# Patient Record
Sex: Female | Born: 1960 | Race: White | Hispanic: No | Smoking: Never smoker
Health system: Southern US, Community
[De-identification: ages and names within clinical notes are randomized; demographics above are authoritative.]

## PROBLEM LIST (undated history)

## (undated) DIAGNOSIS — E0590 Thyrotoxicosis, unspecified without thyrotoxic crisis or storm: Secondary | ICD-10-CM

## (undated) DIAGNOSIS — M5430 Sciatica, unspecified side: Secondary | ICD-10-CM

## (undated) DIAGNOSIS — M1990 Unspecified osteoarthritis, unspecified site: Secondary | ICD-10-CM

## (undated) DIAGNOSIS — I10 Essential (primary) hypertension: Secondary | ICD-10-CM

## (undated) DIAGNOSIS — J40 Bronchitis, not specified as acute or chronic: Secondary | ICD-10-CM

## (undated) DIAGNOSIS — N390 Urinary tract infection, site not specified: Secondary | ICD-10-CM

## (undated) DIAGNOSIS — N12 Tubulo-interstitial nephritis, not specified as acute or chronic: Secondary | ICD-10-CM

## (undated) DIAGNOSIS — E119 Type 2 diabetes mellitus without complications: Secondary | ICD-10-CM

## (undated) HISTORY — PX: OTHER SURGICAL HISTORY: SHX169

## (undated) HISTORY — PX: CHOLECYSTECTOMY: SHX55

## (undated) HISTORY — PX: TUBAL LIGATION: SHX77

---

## 2005-03-08 ENCOUNTER — Emergency Department: Payer: Self-pay | Admitting: Emergency Medicine

## 2006-11-11 ENCOUNTER — Emergency Department: Payer: Self-pay | Admitting: Emergency Medicine

## 2010-06-18 ENCOUNTER — Emergency Department: Payer: Self-pay | Admitting: Emergency Medicine

## 2012-04-14 ENCOUNTER — Emergency Department: Payer: Self-pay | Admitting: Emergency Medicine

## 2012-04-14 LAB — COMPREHENSIVE METABOLIC PANEL
Alkaline Phosphatase: 61 U/L (ref 50–136)
Anion Gap: 7 (ref 7–16)
BUN: 11 mg/dL (ref 7–18)
Bilirubin,Total: 0.3 mg/dL (ref 0.2–1.0)
Calcium, Total: 8.4 mg/dL — ABNORMAL LOW (ref 8.5–10.1)
EGFR (African American): >60
Osmolality: 276 (ref 275–301)
Potassium: 3.8 mmol/L (ref 3.5–5.1)
SGOT(AST): 19 U/L (ref 15–37)
SGPT (ALT): 26 U/L

## 2012-04-14 LAB — URINALYSIS, COMPLETE
Bilirubin,UR: NEGATIVE
Blood: NEGATIVE
Ketone: NEGATIVE
Leukocyte Esterase: NEGATIVE
Nitrite: NEGATIVE
Specific Gravity: 1.023 (ref 1.003–1.030)

## 2012-04-14 LAB — CBC
HGB: 11.2 g/dL — ABNORMAL LOW (ref 12.0–16.0)
MCH: 23.6 pg — ABNORMAL LOW (ref 26.0–34.0)
RDW: 18 % — ABNORMAL HIGH (ref 11.5–14.5)

## 2012-04-14 LAB — PREGNANCY, URINE: Pregnancy Test, Urine: NEGATIVE m[IU]/mL

## 2012-10-27 ENCOUNTER — Emergency Department: Payer: Self-pay | Admitting: Unknown Physician Specialty

## 2012-10-27 LAB — COMPREHENSIVE METABOLIC PANEL
Alkaline Phosphatase: 65 U/L (ref 50–136)
Anion Gap: 5 — ABNORMAL LOW (ref 7–16)
BUN: 13 mg/dL (ref 7–18)
Bilirubin,Total: 0.5 mg/dL (ref 0.2–1.0)
Chloride: 105 mmol/L (ref 98–107)
Co2: 27 mmol/L (ref 21–32)
Creatinine: 0.37 mg/dL — ABNORMAL LOW (ref 0.60–1.30)
EGFR (African American): >60
EGFR (Non-African Amer.): >60
Glucose: 129 mg/dL — ABNORMAL HIGH (ref 65–99)
Potassium: 4.2 mmol/L (ref 3.5–5.1)
SGOT(AST): 28 U/L (ref 15–37)
Sodium: 137 mmol/L (ref 136–145)
Total Protein: 7.1 g/dL (ref 6.4–8.2)

## 2012-10-27 LAB — CBC
HCT: 36.1 % (ref 35.0–47.0)
HGB: 11.8 g/dL — ABNORMAL LOW (ref 12.0–16.0)
MCH: 24.7 pg — ABNORMAL LOW (ref 26.0–34.0)
MCHC: 32.8 g/dL (ref 32.0–36.0)
MCV: 75 fL — ABNORMAL LOW (ref 80–100)
RDW: 15.4 % — ABNORMAL HIGH (ref 11.5–14.5)

## 2012-10-27 LAB — URIC ACID: Uric Acid: 4 mg/dL (ref 2.6–6.0)

## 2014-05-11 ENCOUNTER — Emergency Department: Payer: Self-pay | Admitting: Emergency Medicine

## 2014-05-11 LAB — CBC
HCT: 38.3 % (ref 35.0–47.0)
HGB: 12.9 g/dL (ref 12.0–16.0)
MCH: 26 pg (ref 26.0–34.0)
MCHC: 33.6 g/dL (ref 32.0–36.0)
MCV: 77 fL — ABNORMAL LOW (ref 80–100)
PLATELETS: 235 10*3/uL (ref 150–440)
RBC: 4.96 10*6/uL (ref 3.80–5.20)
RDW: 14.6 % — ABNORMAL HIGH (ref 11.5–14.5)
WBC: 8.2 10*3/uL (ref 3.6–11.0)

## 2014-05-11 LAB — BASIC METABOLIC PANEL
Anion Gap: 5 — ABNORMAL LOW (ref 7–16)
BUN: 14 mg/dL (ref 7–18)
CALCIUM: 8.6 mg/dL (ref 8.5–10.1)
CO2: 27 mmol/L (ref 21–32)
Chloride: 106 mmol/L (ref 98–107)
Creatinine: 0.45 mg/dL — ABNORMAL LOW (ref 0.60–1.30)
EGFR (African American): >60
Glucose: 108 mg/dL — ABNORMAL HIGH (ref 65–99)
Osmolality: 277 (ref 275–301)
POTASSIUM: 3.6 mmol/L (ref 3.5–5.1)
Sodium: 138 mmol/L (ref 136–145)

## 2014-05-11 LAB — TROPONIN I: Troponin-I: <0.02 ng/mL

## 2014-08-25 ENCOUNTER — Emergency Department: Payer: Self-pay | Admitting: Emergency Medicine

## 2014-08-25 LAB — URINALYSIS, COMPLETE
Bilirubin,UR: NEGATIVE
Glucose,UR: 50 mg/dL (ref 0–75)
Ketone: NEGATIVE
NITRITE: NEGATIVE
Ph: 6 (ref 4.5–8.0)
Protein: NEGATIVE
SPECIFIC GRAVITY: 1.014 (ref 1.003–1.030)

## 2014-08-25 LAB — CBC WITH DIFFERENTIAL/PLATELET
Basophil #: 0 10*3/uL (ref 0.0–0.1)
Basophil %: 0.4 %
EOS PCT: 1.9 %
Eosinophil #: 0.1 10*3/uL (ref 0.0–0.7)
HCT: 37.2 % (ref 35.0–47.0)
HGB: 12.4 g/dL (ref 12.0–16.0)
LYMPHS PCT: 36 %
Lymphocyte #: 2.7 10*3/uL (ref 1.0–3.6)
MCH: 25.7 pg — AB (ref 26.0–34.0)
MCHC: 33.3 g/dL (ref 32.0–36.0)
MCV: 77 fL — ABNORMAL LOW (ref 80–100)
Monocyte #: 0.7 x10 3/mm (ref 0.2–0.9)
Monocyte %: 9.9 %
Neutrophil #: 3.9 10*3/uL (ref 1.4–6.5)
Neutrophil %: 51.8 %
PLATELETS: 218 10*3/uL (ref 150–440)
RBC: 4.82 10*6/uL (ref 3.80–5.20)
RDW: 13.8 % (ref 11.5–14.5)
WBC: 7.4 10*3/uL (ref 3.6–11.0)

## 2014-08-25 LAB — COMPREHENSIVE METABOLIC PANEL
ALBUMIN: 3 g/dL — AB (ref 3.4–5.0)
ANION GAP: 7 (ref 7–16)
AST: 30 U/L (ref 15–37)
Alkaline Phosphatase: 121 U/L — ABNORMAL HIGH
BILIRUBIN TOTAL: 0.5 mg/dL (ref 0.2–1.0)
BUN: 9 mg/dL (ref 7–18)
Calcium, Total: 8.3 mg/dL — ABNORMAL LOW (ref 8.5–10.1)
Chloride: 106 mmol/L (ref 98–107)
Co2: 28 mmol/L (ref 21–32)
Creatinine: 0.44 mg/dL — ABNORMAL LOW (ref 0.60–1.30)
GLUCOSE: 236 mg/dL — AB (ref 65–99)
Osmolality: 288 (ref 275–301)
Potassium: 3.5 mmol/L (ref 3.5–5.1)
SGPT (ALT): 31 U/L
Sodium: 141 mmol/L (ref 136–145)
Total Protein: 6.5 g/dL (ref 6.4–8.2)

## 2014-08-25 LAB — DRUG SCREEN, URINE

## 2014-08-26 LAB — TSH: Thyroid Stimulating Horm: <0.01 u[IU]/mL — ABNORMAL LOW

## 2014-08-26 LAB — TROPONIN I: Troponin-I: <0.02 ng/mL

## 2014-08-26 LAB — D-DIMER(ARMC): D-Dimer: 249 ng/ml

## 2014-10-11 ENCOUNTER — Emergency Department: Payer: Self-pay | Admitting: Emergency Medicine

## 2014-10-11 LAB — BASIC METABOLIC PANEL
Anion Gap: 6 — ABNORMAL LOW (ref 7–16)
BUN: 13 mg/dL (ref 7–18)
CALCIUM: 8.4 mg/dL — AB (ref 8.5–10.1)
CHLORIDE: 104 mmol/L (ref 98–107)
Co2: 29 mmol/L (ref 21–32)
Creatinine: 0.55 mg/dL — ABNORMAL LOW (ref 0.60–1.30)
EGFR (Non-African Amer.): >60
GLUCOSE: 147 mg/dL — AB (ref 65–99)
Osmolality: 280 (ref 275–301)
POTASSIUM: 4 mmol/L (ref 3.5–5.1)
Sodium: 139 mmol/L (ref 136–145)

## 2014-10-11 LAB — TSH

## 2014-10-11 LAB — CBC
HCT: 42.4 % (ref 35.0–47.0)
HGB: 14.4 g/dL (ref 12.0–16.0)
MCH: 26.2 pg (ref 26.0–34.0)
MCHC: 34 g/dL (ref 32.0–36.0)
MCV: 77 fL — ABNORMAL LOW (ref 80–100)
PLATELETS: 230 10*3/uL (ref 150–440)
RBC: 5.49 10*6/uL — ABNORMAL HIGH (ref 3.80–5.20)
RDW: 13.8 % (ref 11.5–14.5)
WBC: 6.8 10*3/uL (ref 3.6–11.0)

## 2014-10-11 LAB — PROTIME-INR
INR: 1.1
PROTHROMBIN TIME: 13.7 s (ref 11.5–14.7)

## 2014-10-11 LAB — D-DIMER(ARMC): D-DIMER: 247 ng/mL

## 2014-10-11 LAB — PRO B NATRIURETIC PEPTIDE: B-Type Natriuretic Peptide: 32 pg/mL (ref 0–125)

## 2014-10-11 LAB — TROPONIN I

## 2014-11-10 ENCOUNTER — Emergency Department: Payer: Self-pay | Admitting: Emergency Medicine

## 2015-03-02 ENCOUNTER — Emergency Department: Admit: 2015-03-02 | Disposition: A | Discharge status: Home / Self Care | Payer: Self-pay | Admitting: Internal Medicine

## 2015-05-04 DIAGNOSIS — R000 Tachycardia, unspecified: Secondary | ICD-10-CM | POA: Insufficient documentation

## 2015-05-04 DIAGNOSIS — I10 Essential (primary) hypertension: Secondary | ICD-10-CM | POA: Insufficient documentation

## 2015-05-04 DIAGNOSIS — M545 Low back pain: Secondary | ICD-10-CM | POA: Diagnosis present

## 2015-05-04 DIAGNOSIS — M5442 Lumbago with sciatica, left side: Secondary | ICD-10-CM | POA: Insufficient documentation

## 2015-05-04 NOTE — ED Notes (Signed)
Patient ambulatory to triage with steady gait, without difficulty or distress noted; pt reports lower back pain for few months with no known injury; st pain radiates down into left leg & foot; denies hx of same

## 2015-05-05 ENCOUNTER — Emergency Department
Admission: EM | Admit: 2015-05-05 | Discharge: 2015-05-05 | Disposition: A | Discharge status: Home / Self Care | Payer: 59 | Attending: Emergency Medicine | Admitting: Emergency Medicine

## 2015-05-05 ENCOUNTER — Encounter: Payer: Self-pay | Admitting: Emergency Medicine

## 2015-05-05 DIAGNOSIS — M5442 Lumbago with sciatica, left side: Secondary | ICD-10-CM

## 2015-05-05 HISTORY — DX: Thyrotoxicosis, unspecified without thyrotoxic crisis or storm: E05.90

## 2015-05-05 HISTORY — DX: Essential (primary) hypertension: I10

## 2015-05-05 LAB — URINALYSIS COMPLETE WITH MICROSCOPIC (ARMC ONLY)
BILIRUBIN URINE: NEGATIVE
Bacteria, UA: NONE SEEN
GLUCOSE, UA: 50 mg/dL — AB
Hgb urine dipstick: NEGATIVE
KETONES UR: NEGATIVE mg/dL
Leukocytes, UA: NEGATIVE
Nitrite: NEGATIVE
PROTEIN: NEGATIVE mg/dL
SPECIFIC GRAVITY, URINE: 1.01 (ref 1.005–1.030)
Squamous Epithelial / LPF: NONE SEEN
pH: 5 (ref 5.0–8.0)

## 2015-05-05 MED ORDER — TRAMADOL HCL 50 MG PO TABS
50.0000 mg | ORAL_TABLET | Freq: Once | ORAL | Status: AC
Start: 2015-05-05 — End: 2015-05-05
  Administered 2015-05-05: 50 mg via ORAL

## 2015-05-05 MED ORDER — TRAMADOL HCL 50 MG PO TABS: 50.0000 mg | ORAL_TABLET | Freq: Four times a day (QID) | ORAL | Status: DC | PRN

## 2015-05-05 MED ORDER — TRAMADOL HCL 50 MG PO TABS
ORAL_TABLET | ORAL | Status: AC
  Filled 2015-05-05: qty 1

## 2015-05-05 NOTE — ED Provider Notes (Signed)
[facility] Emergency Department Provider Note  ____________________________________________  Time seen: Approximately 234 AM  I have reviewed the triage vital signs and the nursing notes.   HISTORY  Chief Complaint Back Pain    HPI Kristin Wilson is a 54 y.o. female who comes in today with back pain. The patient reports that she's had back pain on and off for a while meaning a few months but reports it is worse today. She reports that the pain goes down into her left leg. The patient reports that she's been taking ibuprofen for pain but that the pain has not been getting better. She reports that the pain initially was an 8 out of 10 in intensity and is now a 5 out of 10 in intensity. The patient has not seen her doctor for this pain and she just received insurance. The patient reports that the sharp pain that it just hurts. Any injury to her back but again this is been going on for some months. A shunt has no difficulty with urination no incontinence or urinary retention. She is here for evaluation   Past Medical History  Diagnosis Date  . Hypertension   . Hyperthyroidism     There are no active problems to display for this patient.   Past Surgical History  Procedure Laterality Date  . Cholecystecomy    . Tubal ligation      Current Outpatient Rx  Name  Route  Sig  Dispense  Refill  . traMADol (ULTRAM) 50 MG tablet   Oral   Take 1 tablet (50 mg total) by mouth every 6 (six) hours as needed.   12 tablet   0     Allergies Review of patient's allergies indicates no known allergies.  No family history on file.  Social History History  Substance Use Topics  . Smoking status: Never Smoker   . Smokeless tobacco: Not on file  . Alcohol Use: No    Review of Systems Constitutional: No fever/chills Eyes: No visual changes. ENT: No sore throat. Cardiovascular: Denies chest pain. Respiratory: Denies shortness of breath. Gastrointestinal:  No abdominal pain.  No nausea, no vomiting.   Genitourinary: Negative for dysuria. Musculoskeletal: back pain. Skin: Negative for rash. Neurological: Negative for headaches,   10-point ROS otherwise negative.  ____________________________________________   PHYSICAL EXAM:  VITAL SIGNS: ED Triage Vitals  Enc Vitals Group     BP 05/04/15 2359 172/79 mmHg     Pulse Rate 05/04/15 2359 100     Resp 05/04/15 2359 20     Temp 05/04/15 2359 98 F (36.7 C)     Temp Source 05/04/15 2359 Oral     SpO2 05/04/15 2359 97 %     Weight 05/04/15 2359 170 lb (77.111 kg)     Height 05/04/15 2359 5\' 2"  (1.575 m)     Head Cir --      Peak Flow --      Pain Score 05/04/15 2359 10     Pain Loc --      Pain Edu? --      Excl. in [place]? --     Constitutional: Alert and oriented. Well appearing and in mild distress. Eyes: Conjunctivae are normal. PERRL. EOMI. Head: Atraumatic. Nose: No congestion/rhinnorhea. Mouth/Throat: Mucous membranes are moist.  Oropharynx non-erythematous. Cardiovascular: Tachycardia, regular rhythm. Grossly normal heart sounds.  Good peripheral circulation. Respiratory: Normal respiratory effort.  No retractions. Lungs CTAB. Gastrointestinal: Soft and nontender. No distention. Positive bowel sounds Genitourinary:  Deferred Musculoskeletal: No pain with active range of motion, negative straight leg raise, no significant tenderness to palpation, no midline tenderness to palpation. Neurologic:  Normal speech and language. No gross focal neurologic deficits are appreciated.  Skin:  Skin is warm, dry and intact. No rash noted. Psychiatric: Mood and affect are normal.   ____________________________________________   LABS (all labs ordered are listed, but only abnormal results are displayed)  Labs Reviewed  URINALYSIS COMPLETEWITH MICROSCOPIC ([place] ONLY) - Abnormal; Notable for the following:    Color, Urine YELLOW (*)    APPearance CLEAR (*)    Glucose, UA 50 (*)    All  other components within normal limits   ____________________________________________  EKG  None ____________________________________________  RADIOLOGY  None ____________________________________________   PROCEDURES  Procedure(s) performed: None  Critical Care performed: No  ____________________________________________   INITIAL IMPRESSION / ASSESSMENT AND PLAN / ED COURSE  Pertinent labs & imaging results that were available during my care of the patient were reviewed by me and considered in my medical decision making (see chart for details).  The patient is a 54 year old female who comes in with months of intermittent back pain with pain down the left side of her leg that is worse today. On initial evaluation the patient was sleeping and appeared comfortable. She also is able to move without significant pain and discomfort. I did give the patient a dose of tramadol and took the patient urine which was unremarkable. The patient's pain was significantly improved after the dose of tramadol and she was sleeping comfortably. I will discharge the patient home to follow-up with orthopedics for further evaluation of her intermittent chronic back pain. ____________________________________________   FINAL CLINICAL IMPRESSION(S) / ED DIAGNOSES  Final diagnoses:  Bilateral low back pain with left-sided sciatica      Loney Hering, MD 05/05/15 [phone]

## 2015-05-05 NOTE — ED Notes (Signed)
Patient discharged to home per MD order. Patient in stable condition, and deemed medically cleared by ED provider for discharge. Discharge instructions reviewed with patient/family using "Teach Back"; verbalized understanding of medication education and administration, and information about follow-up care. Denies further concerns. ° °

## 2015-05-05 NOTE — ED Notes (Signed)
Pt resting with eyes closed no signs of pain or distress noted.

## 2015-05-05 NOTE — ED Notes (Signed)
Report received from [facility]. Patient care assumed. Patient/RN introduction complete. Will continue to monitor.

## 2015-05-05 NOTE — Discharge Instructions (Signed)
Back Pain, Adult °Back pain is very common. The pain often gets better over time. The cause of back pain is usually not dangerous. Most people can learn to manage their back pain on their own.  °HOME CARE  °· Stay active. Start with short walks on flat ground if you can. Try to walk farther each day. °· Do not sit, drive, or stand in one place for more than 30 minutes. Do not stay in bed. °· Do not avoid exercise or work. Activity can help your back heal faster. °· Be careful when you bend or lift an object. Bend at your knees, keep the object close to you, and do not twist. °· Sleep on a firm mattress. Lie on your side, and bend your knees. If you lie on your back, put a pillow under your knees. °· Only take medicines as told by your doctor. °· Put ice on the injured area. °¨ Put ice in a plastic bag. °¨ Place a towel between your skin and the bag. °¨ Leave the ice on for 15-20 minutes, 03-04 times a day for the first 2 to 3 days. After that, you can switch between ice and heat packs. °· Ask your doctor about back exercises or massage. °· Avoid feeling anxious or stressed. Find good ways to deal with stress, such as exercise. °GET HELP RIGHT AWAY IF:  °· Your pain does not go away with rest or medicine. °· Your pain does not go away in 1 week. °· You have new problems. °· You do not feel well. °· The pain spreads into your legs. °· You cannot control when you poop (bowel movement) or pee (urinate). °· Your arms or legs feel weak or lose feeling (numbness). °· You feel sick to your stomach (nauseous) or throw up (vomit). °· You have belly (abdominal) pain. °· You feel like you may pass out (faint). °MAKE SURE YOU:  °· Understand these instructions. °· Will watch your condition. °· Will get help right away if you are not doing well or get worse. °Document Released: 05/01/2008 Document Revised: 02/05/2012 Document Reviewed: 03/17/2014 °[employer]® Patient Information ©2015 [employer]. This information is not intended  to replace advice given to you by your health care provider. Make sure you discuss any questions you have with your health care provider. ° °Back Exercises °Back exercises help treat and prevent back injuries. The goal is to increase your strength in your belly (abdominal) and back muscles. These exercises can also help with flexibility. Start these exercises when told by your doctor. °HOME CARE °Back exercises include: °Pelvic Tilt. °· Lie on your back with your knees bent. Tilt your pelvis until the lower part of your back is against the floor. Hold this position 5 to 10 sec. Repeat this exercise 5 to 10 times. °Knee to Chest. °· Pull 1 knee up against your chest and hold for 20 to 30 seconds. Repeat this with the other knee. This may be done with the other leg straight or bent, whichever feels better. Then, pull both knees up against your chest. °Sit-Ups or Curl-Ups. °· Bend your knees 90 degrees. Start with tilting your pelvis, and do a partial, slow sit-up. Only lift your upper half 30 to 45 degrees off the floor. Take at least 2 to 3 seonds for each sit-up. Do not do sit-ups with your knees out straight. If partial sit-ups are difficult, simply do the above but with only tightening your belly (abdominal) muscles and holding it as   told. Hip-Lift.  Lie on your back with your knees flexed 90 degrees. Push down with your feet and shoulders as you raise your hips 2 inches off the floor. Hold for 10 seconds, repeat 5 to 10 times. Back Arches.  Lie on your stomach. Prop yourself up on bent elbows. Slowly press on your hands, causing an arch in your low back. Repeat 3 to 5 times. Shoulder-Lifts.  Lie face down with arms beside your body. Keep hips and belly pressed to floor as you slowly lift your head and shoulders off the floor. Do not overdo your exercises. Be careful in the beginning. Exercises may cause you some mild back discomfort. If the pain lasts for more than 15 minutes, stop the exercises until  you see your doctor. Improvement with exercise for back problems is slow.  Document Released: 12/16/2010 Document Revised: 02/05/2012 Document Reviewed: 09/14/2011 [facility] Patient Information 2015 [place], [place]. This information is not intended to replace advice given to you by your health care provider. Make sure you discuss any questions you have with your health care provider.

## 2015-05-12 ENCOUNTER — Encounter: Payer: Self-pay | Admitting: Emergency Medicine

## 2015-05-12 ENCOUNTER — Emergency Department
Admission: EM | Admit: 2015-05-12 | Discharge: 2015-05-13 | Disposition: A | Discharge status: Home / Self Care | Payer: 59 | Attending: Emergency Medicine | Admitting: Emergency Medicine

## 2015-05-12 DIAGNOSIS — R079 Chest pain, unspecified: Secondary | ICD-10-CM | POA: Insufficient documentation

## 2015-05-12 DIAGNOSIS — M79672 Pain in left foot: Secondary | ICD-10-CM | POA: Diagnosis present

## 2015-05-12 DIAGNOSIS — L03116 Cellulitis of left lower limb: Secondary | ICD-10-CM | POA: Diagnosis not present

## 2015-05-12 DIAGNOSIS — I10 Essential (primary) hypertension: Secondary | ICD-10-CM | POA: Insufficient documentation

## 2015-05-12 DIAGNOSIS — R609 Edema, unspecified: Secondary | ICD-10-CM

## 2015-05-12 DIAGNOSIS — M79605 Pain in left leg: Secondary | ICD-10-CM

## 2015-05-12 LAB — CBC
HEMATOCRIT: 38.6 % (ref 35.0–47.0)
HEMOGLOBIN: 12.7 g/dL (ref 12.0–16.0)
MCH: 24.8 pg — ABNORMAL LOW (ref 26.0–34.0)
MCHC: 32.9 g/dL (ref 32.0–36.0)
MCV: 75.5 fL — AB (ref 80.0–100.0)
PLATELETS: 235 10*3/uL (ref 150–440)
RBC: 5.12 MIL/uL (ref 3.80–5.20)
RDW: 15.3 % — ABNORMAL HIGH (ref 11.5–14.5)
WBC: 7.6 10*3/uL (ref 3.6–11.0)

## 2015-05-12 LAB — BASIC METABOLIC PANEL
Anion gap: 5 (ref 5–15)
BUN: 16 mg/dL (ref 6–20)
CO2: 27 mmol/L (ref 22–32)
CREATININE: 0.39 mg/dL — AB (ref 0.44–1.00)
Calcium: 9.3 mg/dL (ref 8.9–10.3)
Chloride: 106 mmol/L (ref 101–111)
GFR calc non Af Amer: >60 mL/min (ref >60)
GLUCOSE: 192 mg/dL — AB (ref 65–99)
POTASSIUM: 3.7 mmol/L (ref 3.5–5.1)
Sodium: 138 mmol/L (ref 135–145)

## 2015-05-12 LAB — TROPONIN I: Troponin I: <0.03 ng/mL (ref <0.031)

## 2015-05-12 NOTE — ED Notes (Addendum)
Pt reports painful left foot up to knee, denies injury; pt reports pain x3 days, pt ambulatory to triage. Pt also reports chest pain that has been intermittent x4-5 days; reports started after arriving to hospital. Pt reports radiation to back, nausea; denies shortness of breath, vomiting.

## 2015-05-12 NOTE — ED Notes (Signed)
Pt presents ambulatory to ED with c/o ankle/foot pain with no known injury.

## 2015-05-13 ENCOUNTER — Emergency Department: Payer: 59

## 2015-05-13 MED ORDER — CEPHALEXIN 500 MG PO CAPS
500.0000 mg | ORAL_CAPSULE | Freq: Once | ORAL | Status: AC
  Administered 2015-05-13: 500 mg via ORAL

## 2015-05-13 MED ORDER — CEPHALEXIN 500 MG PO CAPS
ORAL_CAPSULE | ORAL | Status: AC
  Administered 2015-05-13: 500 mg via ORAL
  Filled 2015-05-13: qty 1

## 2015-05-13 MED ORDER — CEPHALEXIN 500 MG PO CAPS: 500.0000 mg | ORAL_CAPSULE | Freq: Two times a day (BID) | ORAL | Status: DC

## 2015-05-13 NOTE — ED Provider Notes (Signed)
[facility] Emergency Department Provider Note  ____________________________________________  Time seen: 1:15 AM  I have reviewed the triage vital signs and the nursing notes.   HISTORY  Chief Complaint Foot Pain and Chest Pain      HPI Kristin Wilson is a 54 y.o. female presents with nontraumatic pain to leftfoot pain and swelling 3 days. Patient denies any chest pain denies shortness of breath no history of DVTs or PE.     Past Medical History  Diagnosis Date  . Hypertension   . Hyperthyroidism     There are no active problems to display for this patient.   Past Surgical History  Procedure Laterality Date  . Cholecystecomy    . Tubal ligation    . Cholecystectomy      Current Outpatient Rx  Name  Route  Sig  Dispense  Refill  . traMADol (ULTRAM) 50 MG tablet   Oral   Take 1 tablet (50 mg total) by mouth every 6 (six) hours as needed.   12 tablet   0     Allergies Review of patient's allergies indicates no known allergies.  No family history on file.  Social History History  Substance Use Topics  . Smoking status: Never Smoker   . Smokeless tobacco: Not on file  . Alcohol Use: No    Review of Systems  Constitutional: Negative for fever. Eyes: Negative for visual changes. ENT: Negative for sore throat. Cardiovascular: Negative for chest pain. Respiratory: Negative for shortness of breath. Gastrointestinal: Negative for abdominal pain, vomiting and diarrhea. Genitourinary: Negative for dysuria. Musculoskeletal: Negative for back pain. Positive left foot pain and swelling Skin: Negative for rash. Neurological: Negative for headaches, focal weakness or numbness.   10-point ROS otherwise negative.  ____________________________________________   PHYSICAL EXAM:  VITAL SIGNS: ED Triage Vitals  Enc Vitals Group     BP 05/12/15 2222 190/96 mmHg     Pulse Rate 05/12/15 2220 123     Resp 05/12/15 2220 20      Temp 05/12/15 2220 98.9 F (37.2 C)     Temp Source 05/12/15 2220 Oral     SpO2 05/12/15 2222 97 %     Weight 05/12/15 2220 175 lb (79.379 kg)     Height 05/12/15 2220 5\' 2"  (1.575 m)     Head Cir --      Peak Flow --      Pain Score 05/12/15 2222 6     Pain Loc --      Pain Edu? --      Excl. in [place]? --      Constitutional: Alert and oriented. Well appearing and in no distress. Eyes: Conjunctivae are normal. PERRL. Normal extraocular movements. ENT   Head: Normocephalic and atraumatic.   Nose: No congestion/rhinnorhea.   Mouth/Throat: Mucous membranes are moist.   Neck: No stridor. Hematological/Lymphatic/Immunilogical: No cervical lymphadenopathy. Cardiovascular: Normal rate, regular rhythm. Normal and symmetric distal pulses are present in all extremities. No murmurs, rubs, or gallops. Respiratory: Normal respiratory effort without tachypnea nor retractions. Breath sounds are clear and equal bilaterally. No wheezes/rales/rhonchi. Gastrointestinal: Soft and nontender. No distention. There is no CVA tenderness. Genitourinary: deferred Musculoskeletal: Nontender with normal range of motion in all extremities. No joint effusions. Positive left foot pain and swelling  Neurologic:  Normal speech and language. No gross focal neurologic deficits are appreciated. Speech is normal.  Skin:  Skin is warm, dry and intact. No rash noted. Erythema to the dorsal aspect of  the left foot and medial malleoli Psychiatric: Mood and affect are normal. Speech and behavior are normal. Patient exhibits appropriate insight and judgment.  ____________________________________________    LABS (pertinent positives/negatives)  Labs Reviewed  CBC - Abnormal; Notable for the following:    MCV 75.5 (*)    MCH 24.8 (*)    RDW 15.3 (*)    All other components within normal limits  BASIC METABOLIC PANEL - Abnormal; Notable for the following:    Glucose, Bld 192 (*)    Creatinine, Ser 0.39 (*)     All other components within normal limits  TROPONIN I       RADIOLOGY  Left lower extremity ultrasound revealed no evidence of a DVT per radiologist     INITIAL IMPRESSION / [place] / ED COURSE  Pertinent labs & imaging results that were available during my care of the patient were reviewed by me and considered in my medical decision making (see chart for details).  History of physical exam concerning for possible cellulitis of the left foot as such patient given Keflex and will be prescribed same at home. Ultrasound revealed no evidence of a DVT however patient is advised to return to the emergency department immediately if pain or swelling worsens.  ____________________________________________   FINAL CLINICAL IMPRESSION(S) / ED DIAGNOSES  Final diagnoses:  Swelling  Left leg pain  Cellulitis of left foot      Gregor Hams, MD 05/13/15 [phone]

## 2015-05-13 NOTE — Discharge Instructions (Signed)

## 2015-06-18 ENCOUNTER — Emergency Department
Admission: EM | Admit: 2015-06-18 | Discharge: 2015-06-18 | Disposition: A | Discharge status: Home / Self Care | Payer: Commercial Managed Care - HMO | Attending: Emergency Medicine | Admitting: Emergency Medicine

## 2015-06-18 ENCOUNTER — Encounter: Payer: Self-pay | Admitting: *Deleted

## 2015-06-18 DIAGNOSIS — M5432 Sciatica, left side: Secondary | ICD-10-CM | POA: Diagnosis not present

## 2015-06-18 DIAGNOSIS — I10 Essential (primary) hypertension: Secondary | ICD-10-CM | POA: Diagnosis not present

## 2015-06-18 DIAGNOSIS — M545 Low back pain: Secondary | ICD-10-CM | POA: Diagnosis present

## 2015-06-18 DIAGNOSIS — Z792 Long term (current) use of antibiotics: Secondary | ICD-10-CM | POA: Diagnosis not present

## 2015-06-18 MED ORDER — IBUPROFEN 800 MG PO TABS: 800.0000 mg | ORAL_TABLET | Freq: Three times a day (TID) | ORAL | Status: DC | PRN

## 2015-06-18 MED ORDER — DIAZEPAM 2 MG PO TABS: 2.0000 mg | ORAL_TABLET | Freq: Three times a day (TID) | ORAL | Status: DC | PRN

## 2015-06-18 MED ORDER — KETOROLAC TROMETHAMINE 30 MG/ML IJ SOLN
60.0000 mg | Freq: Once | INTRAMUSCULAR | Status: AC
  Administered 2015-06-18: 60 mg via INTRAMUSCULAR
  Filled 2015-06-18: qty 2

## 2015-06-18 MED ORDER — DIAZEPAM 2 MG PO TABS
2.0000 mg | ORAL_TABLET | Freq: Once | ORAL | Status: AC
  Administered 2015-06-18: 2 mg via ORAL
  Filled 2015-06-18: qty 1

## 2015-06-18 MED ORDER — HYDROCODONE-ACETAMINOPHEN 5-325 MG PO TABS: 1.0000 | ORAL_TABLET | Freq: Four times a day (QID) | ORAL | Status: DC | PRN

## 2015-06-18 MED ORDER — HYDROCODONE-ACETAMINOPHEN 5-325 MG PO TABS
1.0000 | ORAL_TABLET | Freq: Once | ORAL | Status: AC
  Administered 2015-06-18: 1 via ORAL
  Filled 2015-06-18: qty 1

## 2015-06-18 NOTE — ED Provider Notes (Signed)
[facility] Emergency Department Provider Note  ____________________________________________  Time seen: Approximately 12:54 AM  I have reviewed the triage vital signs and the nursing notes.   HISTORY  Chief Complaint Back Pain    HPI Kristin Wilson is a 54 y.o. female who presents to the ED from home with a chief complaint of back pain. Patient states her back has been hurting for "a while"; specifically approximately 1 month, but has worsened over the past week. 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 Describes sharp left lower back pain shooting into the left leg. Denies trauma/fall/injury. Denies numbness/tingling/weakness. Denies urinary or bowel incontinence. Denies dysuria. Of note, patient treated approximately 1 month ago for left foot cellulitis.States foot has remained swollen, although generally the swelling has improved.   Past Medical History  Diagnosis Date  . Hypertension   . Hyperthyroidism     There are no active problems to display for this patient.   Past Surgical History  Procedure Laterality Date  . Cholecystecomy    . Tubal ligation    . Cholecystectomy      Current Outpatient Rx  Name  Route  Sig  Dispense  Refill  . cephALEXin (KEFLEX) 500 MG capsule   Oral   Take 1 capsule (500 mg total) by mouth 2 (two) times daily.   20 capsule   0   . traMADol (ULTRAM) 50 MG tablet   Oral   Take 1 tablet (50 mg total) by mouth every 6 (six) hours as needed.   12 tablet   0     Allergies Review of patient's allergies indicates no known allergies.  No family history on file.  Social History History  Substance Use Topics  . Smoking status: Never Smoker   . Smokeless tobacco: Not on file  . Alcohol Use: No    Review of Systems Constitutional: No fever/chills Eyes: No visual changes. ENT: No sore throat. Cardiovascular: Denies chest pain. Respiratory: Denies shortness of  breath. Gastrointestinal: No abdominal pain.  No nausea, no vomiting.  No diarrhea.  No constipation. Genitourinary: Negative for dysuria. Musculoskeletal: Positive for back pain. Skin: Negative for rash. Neurological: Negative for headaches, focal weakness or numbness.  10-point ROS otherwise negative.  ____________________________________________   PHYSICAL EXAM:  VITAL SIGNS: ED Triage Vitals  Enc Vitals Group     BP 06/18/15 0028 158/80 mmHg     Pulse Rate 06/18/15 0028 83     Resp 06/18/15 0028 20     Temp 06/18/15 0028 98.2 F (36.8 C)     Temp Source 06/18/15 0028 Oral     SpO2 06/18/15 0028 99 %     Weight 06/18/15 0028 173 lb (78.472 kg)     Height 06/18/15 0028 5\' 2"  (1.575 m)     Head Cir --      Peak Flow --      Pain Score 06/18/15 0029 10     Pain Loc --      Pain Edu? --      Excl. in [place]? --     Constitutional: Alert and oriented. Well appearing and in no acute distress. Eyes: Conjunctivae are normal. PERRL. EOMI. Head: Atraumatic. Nose: No congestion/rhinnorhea. Mouth/Throat: Mucous membranes are moist.  Oropharynx non-erythematous. Neck: No stridor. No cervical spine tenderness to palpation. Cardiovascular: Normal rate, regular rhythm. Grossly normal heart sounds.  Good peripheral circulation. Respiratory: Normal respiratory effort.  No retractions. Lungs CTAB. Gastrointestinal: Soft and nontender. No distention.  No abdominal bruits. No CVA tenderness. Musculoskeletal: Left paraspinal lumbar area tender to palpation with muscle spasm. Left straight leg raise at approximately 45. There is mild swelling to left foot which patient states has improved from 1 month ago. 2+ femoral, popliteal, DP and TP pulses. Symmetrically warm leg without evidence for ischemia. Supple calf without evidence of compartment syndrome. Neurologic:  Normal speech and language. No gross focal neurologic deficits are appreciated. No gait instability. Skin:  Skin is warm, dry and  intact. No rash noted. Psychiatric: Mood and affect are normal. Speech and behavior are normal.  ____________________________________________   LABS (all labs ordered are listed, but only abnormal results are displayed)  Labs Reviewed - No data to display ____________________________________________  EKG  None ____________________________________________  RADIOLOGY  None ____________________________________________   PROCEDURES  Procedure(s) performed: None  Critical Care performed: No  ____________________________________________   INITIAL IMPRESSION / ASSESSMENT AND PLAN / ED COURSE  Pertinent labs & imaging results that were available during my care of the patient were reviewed by me and considered in my medical decision making (see chart for details).  54 year old female with lower back pain consistent with sciatica. Will treat with NSAID's, analgesia and muscle relaxer. Patient to follow-up with orthopedics. Strict return precautions given. Patient and spouse verbalized understanding and agree with plan of care. ____________________________________________   FINAL CLINICAL IMPRESSION(S) / ED DIAGNOSES  Final diagnoses:  Sciatica, left      Paulette Blanch, MD 06/18/15 [phone]

## 2015-06-18 NOTE — ED Notes (Signed)
Patient to ED for left lower back pain. States pain is radiating into lower leg and great toe. Denies injury.

## 2015-06-18 NOTE — ED Notes (Signed)
Pt has low back pain.  Pain radiates into left leg.  No known injury.  Denies urinary sx.

## 2015-06-18 NOTE — Discharge Instructions (Signed)
1. Take medicines as needed for pain and muscle spasms (Motrin/Norco/Valium #15 and (. 2. Apply moist heat to affected area several times daily. 3. Return to the ER for worsening symptoms, leg weakness, losing control of bowel or bladder or other concerns.  Sciatica Sciatica is pain, weakness, numbness, or tingling along the path of the sciatic nerve. The nerve starts in the lower back and runs down the back of each leg. The nerve controls the muscles in the lower leg and in the back of the knee, while also providing sensation to the back of the thigh, lower leg, and the sole of your foot. Sciatica is a symptom of another medical condition. For instance, nerve damage or certain conditions, such as a herniated disk or bone spur on the spine, pinch or put pressure on the sciatic nerve. This causes the pain, weakness, or other sensations normally associated with sciatica. Generally, sciatica only affects one side of the body. CAUSES   Herniated or slipped disc.  Degenerative disk disease.  A pain disorder involving the narrow muscle in the buttocks (piriformis syndrome).  Pelvic injury or fracture.  Pregnancy.  Tumor (rare). SYMPTOMS  Symptoms can vary from mild to very severe. The symptoms usually travel from the low back to the buttocks and down the back of the leg. Symptoms can include:  Mild tingling or dull aches in the lower back, leg, or hip.  Numbness in the back of the calf or sole of the foot.  Burning sensations in the lower back, leg, or hip.  Sharp pains in the lower back, leg, or hip.  Leg weakness.  Severe back pain inhibiting movement. These symptoms may get worse with coughing, sneezing, laughing, or prolonged sitting or standing. Also, being overweight may worsen symptoms. DIAGNOSIS  Your caregiver will perform a physical exam to look for common symptoms of sciatica. He or she may ask you to do certain movements or activities that would trigger sciatic nerve pain.  Other tests may be performed to find the cause of the sciatica. These may include:  Blood tests.  X-rays.  Imaging tests, such as an MRI or CT scan. TREATMENT  Treatment is directed at the cause of the sciatic pain. Sometimes, treatment is not necessary and the pain and discomfort goes away on its own. If treatment is needed, your caregiver may suggest:  Over-the-counter medicines to relieve pain.  Prescription medicines, such as anti-inflammatory medicine, muscle relaxants, or narcotics.  Applying heat or ice to the painful area.  Steroid injections to lessen pain, irritation, and inflammation around the nerve.  Reducing activity during periods of pain.  Exercising and stretching to strengthen your abdomen and improve flexibility of your spine. Your caregiver may suggest losing weight if the extra weight makes the back pain worse.  Physical therapy.  Surgery to eliminate what is pressing or pinching the nerve, such as a bone spur or part of a herniated disk. HOME CARE INSTRUCTIONS   Only take over-the-counter or prescription medicines for pain or discomfort as directed by your caregiver.  Apply ice to the affected area for 20 minutes, 3-4 times a day for the first 48-72 hours. Then try heat in the same way.  Exercise, stretch, or perform your usual activities if these do not aggravate your pain.  Attend physical therapy sessions as directed by your caregiver.  Keep all follow-up appointments as directed by your caregiver.  Do not wear high heels or shoes that do not provide proper support.  Check your  mattress to see if it is too soft. A firm mattress may lessen your pain and discomfort. SEEK IMMEDIATE MEDICAL CARE IF:   You lose control of your bowel or bladder (incontinence).  You have increasing weakness in the lower back, pelvis, buttocks, or legs.  You have redness or swelling of your back.  You have a burning sensation when you urinate.  You have pain that  gets worse when you lie down or awakens you at night.  Your pain is worse than you have experienced in the past.  Your pain is lasting longer than 4 weeks.  You are suddenly losing weight without reason. MAKE SURE YOU:  Understand these instructions.  Will watch your condition.  Will get help right away if you are not doing well or get worse. Document Released: 11/07/2001 Document Revised: 05/14/2012 Document Reviewed: 03/24/2012 [facility] Patient Information 2015 [place], [place]. This information is not intended to replace advice given to you by your health care provider. Make sure you discuss any questions you have with your health care provider.

## 2015-09-28 ENCOUNTER — Emergency Department
Admission: EM | Admit: 2015-09-28 | Discharge: 2015-09-28 | Disposition: A | Discharge status: Home / Self Care | Payer: 59 | Attending: Emergency Medicine | Admitting: Emergency Medicine

## 2015-09-28 DIAGNOSIS — Z792 Long term (current) use of antibiotics: Secondary | ICD-10-CM | POA: Insufficient documentation

## 2015-09-28 DIAGNOSIS — M5432 Sciatica, left side: Secondary | ICD-10-CM

## 2015-09-28 DIAGNOSIS — I10 Essential (primary) hypertension: Secondary | ICD-10-CM | POA: Insufficient documentation

## 2015-09-28 MED ORDER — KETOROLAC TROMETHAMINE 10 MG PO TABS: 10.0000 mg | ORAL_TABLET | Freq: Three times a day (TID) | ORAL | Status: DC

## 2015-09-28 MED ORDER — HYDROCODONE-ACETAMINOPHEN 5-325 MG PO TABS: 1.0000 | ORAL_TABLET | Freq: Three times a day (TID) | ORAL | Status: DC | PRN

## 2015-09-28 MED ORDER — ORPHENADRINE CITRATE ER 100 MG PO TB12: 100.0000 mg | ORAL_TABLET | Freq: Two times a day (BID) | ORAL | Status: DC | PRN

## 2015-09-28 MED ORDER — KETOROLAC TROMETHAMINE 60 MG/2ML IM SOLN
60.0000 mg | Freq: Once | INTRAMUSCULAR | Status: AC
  Administered 2015-09-28: 60 mg via INTRAMUSCULAR
  Filled 2015-09-28: qty 2

## 2015-09-28 MED ORDER — ORPHENADRINE CITRATE 30 MG/ML IJ SOLN
60.0000 mg | INTRAMUSCULAR | Status: AC
  Administered 2015-09-28: 60 mg via INTRAMUSCULAR
  Filled 2015-09-28: qty 2

## 2015-09-28 NOTE — ED Provider Notes (Signed)
[facility] Emergency Department Provider Note ____________________________________________  Time seen: 2210  I have reviewed the triage vital signs and the nursing notes.  HISTORY  Chief Complaint  Back Pain   HPI Kristin Wilson is a 54 y.o. female reports to the ED for evaluation of low back pain with left lower extremity referral with onset about Saturday, 3 days prior to arrival. She describes pain that begins in the left buttocks and at times refers down the thigh to the calf and foot. She reports the pain as cramping, and dull, with an occasional electrical sensation down the leg. She describes this pain as similar to her previous episodes of low back pain and sciatica, but notes that this one is worse, secondary to the discomfort in her left buttocks. She denies any incontinence of bladder or bowel, denies any leg weakness, or foot drop. She denies any recent injury or trauma to the back including falls, or contusion. She has been dosing ibuprofen without significant relief to her symptoms. She rates her pain at a 9/10 in triage.  Past Medical History  Diagnosis Date  . Hypertension   . Hyperthyroidism     There are no active problems to display for this patient.   Past Surgical History  Procedure Laterality Date  . Cholecystecomy    . Tubal ligation    . Cholecystectomy      Current Outpatient Rx  Name  Route  Sig  Dispense  Refill  . cephALEXin (KEFLEX) 500 MG capsule   Oral   Take 1 capsule (500 mg total) by mouth 2 (two) times daily.   20 capsule   0   . diazepam (VALIUM) 2 MG tablet   Oral   Take 1 tablet (2 mg total) by mouth every 8 (eight) hours as needed for muscle spasms.   15 tablet   0   . HYDROcodone-acetaminophen (NORCO) 5-325 MG tablet   Oral   Take 1 tablet by mouth every 8 (eight) hours as needed for moderate pain.   15 tablet   0   . ibuprofen (ADVIL,MOTRIN) 800 MG tablet   Oral   Take 1 tablet (800 mg total) by  mouth every 8 (eight) hours as needed for moderate pain.   15 tablet   0   . ketorolac (TORADOL) 10 MG tablet   Oral   Take 1 tablet (10 mg total) by mouth every 8 (eight) hours.   15 tablet   0   . orphenadrine (NORFLEX) 100 MG tablet   Oral   Take 1 tablet (100 mg total) by mouth 2 (two) times daily as needed for muscle spasms.   20 tablet   0   . traMADol (ULTRAM) 50 MG tablet   Oral   Take 1 tablet (50 mg total) by mouth every 6 (six) hours as needed.   12 tablet   0    Allergies Review of patient's allergies indicates no known allergies.  No family history on file.  Social History Social History  Substance Use Topics  . Smoking status: Never Smoker   . Smokeless tobacco: Not on file  . Alcohol Use: No   Review of Systems  Constitutional: Negative for fever. Eyes: Negative for visual changes. ENT: Negative for sore throat. Cardiovascular: Negative for chest pain. Respiratory: Negative for shortness of breath. Gastrointestinal: Negative for abdominal pain, vomiting and diarrhea. Genitourinary: Negative for dysuria. Musculoskeletal: Positive for back pain. Skin: Negative for rash. Neurological: Negative for headaches, focal  weakness or numbness. ____________________________________________  PHYSICAL EXAM:  VITAL SIGNS: ED Triage Vitals  Enc Vitals Group     BP 09/28/15 2142 189/70 mmHg     Pulse Rate 09/28/15 2142 92     Resp --      Temp 09/28/15 2142 98.4 F (36.9 C)     Temp Source 09/28/15 2142 Oral     SpO2 09/28/15 2142 95 %     Weight 09/28/15 2142 180 lb 1.6 oz (81.693 kg)     Height 09/28/15 2142 5\' 2"  (1.575 m)     Head Cir --      Peak Flow --      Pain Score 09/28/15 2142 9     Pain Loc --      Pain Edu? --      Excl. in [place]? --    Constitutional: Alert and oriented. Well appearing and in no distress. Head: Normocephalic and atraumatic.      Eyes: Conjunctivae are normal. PERRL. Normal extraocular movements      Ears: Canals clear.  TMs intact bilaterally.   Nose: No congestion/rhinorrhea.   Mouth/Throat: Mucous membranes are moist.   Neck: Supple. No thyromegaly. Hematological/Lymphatic/Immunological: No cervical lymphadenopathy. Cardiovascular: Normal rate, regular rhythm.  Respiratory: Normal respiratory effort. No wheezes/rales/rhonchi. Gastrointestinal: Soft and nontender. No distention. Musculoskeletal: Lumbar spine without obvious deformity, midline tenderness, spasm, or step-off. Patient with normal transition from sit to stand. She is noted to have a negative supine straight leg raise on exam. She is with minimal tenderness to palpation over the left SI joint. Full active range of motion of the left hip on exam without deficit. Nontender with normal range of motion in all extremities.  Neurologic:  Cranial nerves II through XII grossly intact. Normal LE DTRs bilaterally. Normal toe dorsiflexion and ankle eversion on exam. Normal gait without ataxia. Normal speech and language. No gross focal neurologic deficits are appreciated. Skin:  Skin is warm, dry and intact. No rash noted. Psychiatric: Mood and affect are normal. Patient exhibits appropriate insight and judgment. ____________________________________________  PROCEDURES  Toradol 60 mg IM Norflex 60 mg IM ____________________________________________  INITIAL IMPRESSION / ASSESSMENT AND PLAN / ED COURSE  Patient with acute flare of persistent left lotion any sciatica. No mechanical injury or neuromuscular findings to warrant x-ray imaging at this time. Normal musculoskeletal exam without deficit. Patient is discharged with prescriptions for Toradol, Norflex, and Vicodin to dose as needed for acute pain. She will follow-up with one of the local community clinics for ongoing symptoms and primary medical management. ____________________________________________  FINAL CLINICAL IMPRESSION(S) / ED DIAGNOSES  Final diagnoses:  Sciatica without lumbago,  left      Melvenia Needles, PA-C 09/28/15 2238  Earleen Newport, MD 09/28/15 2253

## 2015-09-28 NOTE — Discharge Instructions (Signed)
Sciatica With Rehab The sciatic nerve runs from the back down the leg and is responsible for sensation and control of the muscles in the back (posterior) side of the thigh, lower leg, and foot. Sciatica is a condition that is characterized by inflammation of this nerve.  SYMPTOMS   Signs of nerve damage, including numbness and/or weakness along the posterior side of the lower extremity.  Pain in the back of the thigh that may also travel down the leg.  Pain that worsens when sitting for long periods of time.  Occasionally, pain in the back or buttock. CAUSES  Inflammation of the sciatic nerve is the cause of sciatica. The inflammation is due to something irritating the nerve. Common sources of irritation include:  Sitting for long periods of time.  Direct trauma to the nerve.  Arthritis of the spine.  Herniated or ruptured disk.  Slipping of the vertebrae (spondylolisthesis).  Pressure from soft tissues, such as muscles or ligament-like tissue (fascia). RISK INCREASES WITH:  Sports that place pressure or stress on the spine (football or weightlifting).  Poor strength and flexibility.  Failure to warm up properly before activity.  Family history of low back pain or disk disorders.  Previous back injury or surgery.  Poor body mechanics, especially when lifting, or poor posture. PREVENTION   Warm up and stretch properly before activity.  Maintain physical fitness:  Strength, flexibility, and endurance.  Cardiovascular fitness.  Learn and use proper technique, especially with posture and lifting. When possible, have coach correct improper technique.  Avoid activities that place stress on the spine. PROGNOSIS If treated properly, then sciatica usually resolves within 6 weeks. However, occasionally surgery is necessary.  RELATED COMPLICATIONS   Permanent nerve damage, including pain, numbness, tingle, or weakness.  Chronic back pain.  Risks of surgery: infection,  bleeding, nerve damage, or damage to surrounding tissues. TREATMENT Treatment initially involves resting from any activities that aggravate your symptoms. The use of ice and medication may help reduce pain and inflammation. The use of strengthening and stretching exercises may help reduce pain with activity. These exercises may be performed at home or with referral to a therapist. A therapist may recommend further treatments, such as transcutaneous electronic nerve stimulation (TENS) or ultrasound. Your caregiver may recommend corticosteroid injections to help reduce inflammation of the sciatic nerve. If symptoms persist despite non-surgical (conservative) treatment, then surgery may be recommended. MEDICATION  If pain medication is necessary, then nonsteroidal anti-inflammatory medications, such as aspirin and ibuprofen, or other minor pain relievers, such as acetaminophen, are often recommended.  Do not take pain medication for 7 days before surgery.  Prescription pain relievers may be given if deemed necessary by your caregiver. Use only as directed and only as much as you need.  Ointments applied to the skin may be helpful.  Corticosteroid injections may be given by your caregiver. These injections should be reserved for the most serious cases, because they may only be given a certain number of times. HEAT AND COLD  Cold treatment (icing) relieves pain and reduces inflammation. Cold treatment should be applied for 10 to 15 minutes every 2 to 3 hours for inflammation and pain and immediately after any activity that aggravates your symptoms. Use ice packs or massage the area with a piece of ice (ice massage).  Heat treatment may be used prior to performing the stretching and strengthening activities prescribed by your caregiver, physical therapist, or athletic trainer. Use a heat pack or soak the injury in warm water.  SEEK MEDICAL CARE IF:  Treatment seems to offer no benefit, or the condition  worsens.  Any medications produce adverse side effects. EXERCISES  RANGE OF MOTION (ROM) AND STRETCHING EXERCISES - Sciatica Most people with sciatic will find that their symptoms worsen with either excessive bending forward (flexion) or arching at the low back (extension). The exercises which will help resolve your symptoms will focus on the opposite motion. Your physician, physical therapist or athletic trainer will help you determine which exercises will be most helpful to resolve your low back pain. Do not complete any exercises without first consulting with your clinician. Discontinue any exercises which worsen your symptoms until you speak to your clinician. If you have pain, numbness or tingling which travels down into your buttocks, leg or foot, the goal of the therapy is for these symptoms to move closer to your back and eventually resolve. Occasionally, these leg symptoms will get better, but your low back pain may worsen; this is typically an indication of progress in your rehabilitation. Be certain to be very alert to any changes in your symptoms and the activities in which you participated in the 24 hours prior to the change. Sharing this information with your clinician will allow him/her to most efficiently treat your condition. These exercises may help you when beginning to rehabilitate your injury. Your symptoms may resolve with or without further involvement from your physician, physical therapist or athletic trainer. While completing these exercises, remember:   Restoring tissue flexibility helps normal motion to return to the joints. This allows healthier, less painful movement and activity.  An effective stretch should be held for at least 30 seconds.  A stretch should never be painful. You should only feel a gentle lengthening or release in the stretched tissue. FLEXION RANGE OF MOTION AND STRETCHING EXERCISES: STRETCH - Flexion, Single Knee to Chest   Lie on a firm bed or floor  with both legs extended in front of you.  Keeping one leg in contact with the floor, bring your opposite knee to your chest. Hold your leg in place by either grabbing behind your thigh or at your knee.  Pull until you feel a gentle stretch in your low back. Hold __________ seconds.  Slowly release your grasp and repeat the exercise with the opposite side. Repeat __________ times. Complete this exercise __________ times per day.  STRETCH - Flexion, Double Knee to Chest  Lie on a firm bed or floor with both legs extended in front of you.  Keeping one leg in contact with the floor, bring your opposite knee to your chest.  Tense your stomach muscles to support your back and then lift your other knee to your chest. Hold your legs in place by either grabbing behind your thighs or at your knees.  Pull both knees toward your chest until you feel a gentle stretch in your low back. Hold __________ seconds.  Tense your stomach muscles and slowly return one leg at a time to the floor. Repeat __________ times. Complete this exercise __________ times per day.  STRETCH - Low Trunk Rotation   Lie on a firm bed or floor. Keeping your legs in front of you, bend your knees so they are both pointed toward the ceiling and your feet are flat on the floor.  Extend your arms out to the side. This will stabilize your upper body by keeping your shoulders in contact with the floor.  Gently and slowly drop both knees together to one side until  you feel a gentle stretch in your low back. Hold for __________ seconds.  Tense your stomach muscles to support your low back as you bring your knees back to the starting position. Repeat the exercise to the other side. Repeat __________ times. Complete this exercise __________ times per day  EXTENSION RANGE OF MOTION AND FLEXIBILITY EXERCISES: STRETCH - Extension, Prone on Elbows  Lie on your stomach on the floor, a bed will be too soft. Place your palms about shoulder  width apart and at the height of your head.  Place your elbows under your shoulders. If this is too painful, stack pillows under your chest.  Allow your body to relax so that your hips drop lower and make contact more completely with the floor.  Hold this position for __________ seconds.  Slowly return to lying flat on the floor. Repeat __________ times. Complete this exercise __________ times per day.  RANGE OF MOTION - Extension, Prone Press Ups  Lie on your stomach on the floor, a bed will be too soft. Place your palms about shoulder width apart and at the height of your head.  Keeping your back as relaxed as possible, slowly straighten your elbows while keeping your hips on the floor. You may adjust the placement of your hands to maximize your comfort. As you gain motion, your hands will come more underneath your shoulders.  Hold this position __________ seconds.  Slowly return to lying flat on the floor. Repeat __________ times. Complete this exercise __________ times per day.  STRENGTHENING EXERCISES - Sciatica  These exercises may help you when beginning to rehabilitate your injury. These exercises should be done near your "sweet spot." This is the neutral, low-back arch, somewhere between fully rounded and fully arched, that is your least painful position. When performed in this safe range of motion, these exercises can be used for people who have either a flexion or extension based injury. These exercises may resolve your symptoms with or without further involvement from your physician, physical therapist or athletic trainer. While completing these exercises, remember:   Muscles can gain both the endurance and the strength needed for everyday activities through controlled exercises.  Complete these exercises as instructed by your physician, physical therapist or athletic trainer. Progress with the resistance and repetition exercises only as your caregiver advises.  You may  experience muscle soreness or fatigue, but the pain or discomfort you are trying to eliminate should never worsen during these exercises. If this pain does worsen, stop and make certain you are following the directions exactly. If the pain is still present after adjustments, discontinue the exercise until you can discuss the trouble with your clinician. STRENGTHENING - Deep Abdominals, Pelvic Tilt   Lie on a firm bed or floor. Keeping your legs in front of you, bend your knees so they are both pointed toward the ceiling and your feet are flat on the floor.  Tense your lower abdominal muscles to press your low back into the floor. This motion will rotate your pelvis so that your tail bone is scooping upwards rather than pointing at your feet or into the floor.  With a gentle tension and even breathing, hold this position for __________ seconds. Repeat __________ times. Complete this exercise __________ times per day.  STRENGTHENING - Abdominals, Crunches   Lie on a firm bed or floor. Keeping your legs in front of you, bend your knees so they are both pointed toward the ceiling and your feet are flat on the  floor. Cross your arms over your chest.  Slightly tip your chin down without bending your neck.  Tense your abdominals and slowly lift your trunk high enough to just clear your shoulder blades. Lifting higher can put excessive stress on the low back and does not further strengthen your abdominal muscles.  Control your return to the starting position. Repeat __________ times. Complete this exercise __________ times per day.  STRENGTHENING - Quadruped, Opposite UE/LE Lift  Assume a hands and knees position on a firm surface. Keep your hands under your shoulders and your knees under your hips. You may place padding under your knees for comfort.  Find your neutral spine and gently tense your abdominal muscles so that you can maintain this position. Your shoulders and hips should form a rectangle  that is parallel with the floor and is not twisted.  Keeping your trunk steady, lift your right hand no higher than your shoulder and then your left leg no higher than your hip. Make sure you are not holding your breath. Hold this position __________ seconds.  Continuing to keep your abdominal muscles tense and your back steady, slowly return to your starting position. Repeat with the opposite arm and leg. Repeat __________ times. Complete this exercise __________ times per day.  STRENGTHENING - Abdominals and Quadriceps, Straight Leg Raise   Lie on a firm bed or floor with both legs extended in front of you.  Keeping one leg in contact with the floor, bend the other knee so that your foot can rest flat on the floor.  Find your neutral spine, and tense your abdominal muscles to maintain your spinal position throughout the exercise.  Slowly lift your straight leg off the floor about 6 inches for a count of 15, making sure to not hold your breath.  Still keeping your neutral spine, slowly lower your leg all the way to the floor. Repeat this exercise with each leg __________ times. Complete this exercise __________ times per day. POSTURE AND BODY MECHANICS CONSIDERATIONS - Sciatica Keeping correct posture when sitting, standing or completing your activities will reduce the stress put on different body tissues, allowing injured tissues a chance to heal and limiting painful experiences. The following are general guidelines for improved posture. Your physician or physical therapist will provide you with any instructions specific to your needs. While reading these guidelines, remember:  The exercises prescribed by your provider will help you have the flexibility and strength to maintain correct postures.  The correct posture provides the optimal environment for your joints to work. All of your joints have less wear and tear when properly supported by a spine with good posture. This means you will  experience a healthier, less painful body.  Correct posture must be practiced with all of your activities, especially prolonged sitting and standing. Correct posture is as important when doing repetitive low-stress activities (typing) as it is when doing a single heavy-load activity (lifting). RESTING POSITIONS Consider which positions are most painful for you when choosing a resting position. If you have pain with flexion-based activities (sitting, bending, stooping, squatting), choose a position that allows you to rest in a less flexed posture. You would want to avoid curling into a fetal position on your side. If your pain worsens with extension-based activities (prolonged standing, working overhead), avoid resting in an extended position such as sleeping on your stomach. Most people will find more comfort when they rest with their spine in a more neutral position, neither too rounded nor too  arched. Lying on a non-sagging bed on your side with a pillow between your knees, or on your back with a pillow under your knees will often provide some relief. Keep in mind, being in any one position for a prolonged period of time, no matter how correct your posture, can still lead to stiffness. PROPER SITTING POSTURE In order to minimize stress and discomfort on your spine, you must sit with correct posture Sitting with good posture should be effortless for a healthy body. Returning to good posture is a gradual process. Many people can work toward this most comfortably by using various supports until they have the flexibility and strength to maintain this posture on their own. When sitting with proper posture, your ears will fall over your shoulders and your shoulders will fall over your hips. You should use the back of the chair to support your upper back. Your low back will be in a neutral position, just slightly arched. You may place a small pillow or folded towel at the base of your low back for support.  When  working at a desk, create an environment that supports good, upright posture. Without extra support, muscles fatigue and lead to excessive strain on joints and other tissues. Keep these recommendations in mind: CHAIR:   A chair should be able to slide under your desk when your back makes contact with the back of the chair. This allows you to work closely.  The chair's height should allow your eyes to be level with the upper part of your monitor and your hands to be slightly lower than your elbows. BODY POSITION  Your feet should make contact with the floor. If this is not possible, use a foot rest.  Keep your ears over your shoulders. This will reduce stress on your neck and low back. INCORRECT SITTING POSTURES   If you are feeling tired and unable to assume a healthy sitting posture, do not slouch or slump. This puts excessive strain on your back tissues, causing more damage and pain. Healthier options include:  Using more support, like a lumbar pillow.  Switching tasks to something that requires you to be upright or walking.  Talking a brief walk.  Lying down to rest in a neutral-spine position. PROLONGED STANDING WHILE SLIGHTLY LEANING FORWARD  When completing a task that requires you to lean forward while standing in one place for a long time, place either foot up on a stationary 2-4 inch high object to help maintain the best posture. When both feet are on the ground, the low back tends to lose its slight inward curve. If this curve flattens (or becomes too large), then the back and your other joints will experience too much stress, fatigue more quickly and can cause pain.  CORRECT STANDING POSTURES Proper standing posture should be assumed with all daily activities, even if they only take a few moments, like when brushing your teeth. As in sitting, your ears should fall over your shoulders and your shoulders should fall over your hips. You should keep a slight tension in your abdominal  muscles to brace your spine. Your tailbone should point down to the ground, not behind your body, resulting in an over-extended swayback posture.  INCORRECT STANDING POSTURES  Common incorrect standing postures include a forward head, locked knees and/or an excessive swayback. WALKING Walk with an upright posture. Your ears, shoulders and hips should all line-up. PROLONGED ACTIVITY IN A FLEXED POSITION When completing a task that requires you to bend forward  at your waist or lean over a low surface, try to find a way to stabilize 3 of 4 of your limbs. You can place a hand or elbow on your thigh or rest a knee on the surface you are reaching across. This will provide you more stability so that your muscles do not fatigue as quickly. By keeping your knees relaxed, or slightly bent, you will also reduce stress across your low back. CORRECT LIFTING TECHNIQUES DO :   Assume a wide stance. This will provide you more stability and the opportunity to get as close as possible to the object which you are lifting.  Tense your abdominals to brace your spine; then bend at the knees and hips. Keeping your back locked in a neutral-spine position, lift using your leg muscles. Lift with your legs, keeping your back straight.  Test the weight of unknown objects before attempting to lift them.  Try to keep your elbows locked down at your sides in order get the best strength from your shoulders when carrying an object.  Always ask for help when lifting heavy or awkward objects. INCORRECT LIFTING TECHNIQUES DO NOT:   Lock your knees when lifting, even if it is a small object.  Bend and twist. Pivot at your feet or move your feet when needing to change directions.  Assume that you cannot safely pick up a paperclip without proper posture.   This information is not intended to replace advice given to you by your health care provider. Make sure you discuss any questions you have with your health care provider.     Document Released: 11/13/2005 Document Revised: 03/30/2015 Document Reviewed: 02/25/2009 [employer] Interactive Patient Education 2016 [place] the prescription meds as directed. When you have completed the Toradol (ketorolac), begin taking OTC Aleve for anti-inflammatory benefit. Apply ice to reduce symptoms. Follow-up with one of the community clinics for primary medical care as discussed.

## 2015-09-28 NOTE — ED Notes (Addendum)
Patient ambulatory to triage with steady gait, without difficulty or distress noted; pt reports since Saturday having left lower back pain radiating down back of thigh; denies any known injury; st was seen for same before here and told "just because of her age"; 45 "feels like my sciatica but worse"; denies any accomp symptoms

## 2015-10-07 ENCOUNTER — Emergency Department (HOSPITAL_COMMUNITY): Payer: Self-pay

## 2015-10-07 ENCOUNTER — Encounter (HOSPITAL_COMMUNITY): Payer: Self-pay | Admitting: Emergency Medicine

## 2015-10-07 DIAGNOSIS — Z792 Long term (current) use of antibiotics: Secondary | ICD-10-CM | POA: Insufficient documentation

## 2015-10-07 DIAGNOSIS — Z23 Encounter for immunization: Secondary | ICD-10-CM | POA: Insufficient documentation

## 2015-10-07 DIAGNOSIS — R002 Palpitations: Secondary | ICD-10-CM | POA: Insufficient documentation

## 2015-10-07 DIAGNOSIS — R739 Hyperglycemia, unspecified: Secondary | ICD-10-CM | POA: Insufficient documentation

## 2015-10-07 DIAGNOSIS — E0590 Thyrotoxicosis, unspecified without thyrotoxic crisis or storm: Secondary | ICD-10-CM | POA: Insufficient documentation

## 2015-10-07 DIAGNOSIS — R0789 Other chest pain: Principal | ICD-10-CM | POA: Insufficient documentation

## 2015-10-07 NOTE — ED Notes (Signed)
Pt. reports SOB with palpitations and left arm discomfort onset this morning , denies chest pain or cough . No fever or chills.

## 2015-10-08 ENCOUNTER — Observation Stay (HOSPITAL_BASED_OUTPATIENT_CLINIC_OR_DEPARTMENT_OTHER): Payer: 59

## 2015-10-08 ENCOUNTER — Observation Stay (HOSPITAL_COMMUNITY)
Admission: EM | Admit: 2015-10-08 | Discharge: 2015-10-08 | Disposition: A | Discharge status: Home / Self Care | Payer: Commercial Managed Care - HMO | Attending: Internal Medicine | Admitting: Internal Medicine

## 2015-10-08 ENCOUNTER — Observation Stay (HOSPITAL_COMMUNITY): Payer: 59

## 2015-10-08 ENCOUNTER — Encounter (HOSPITAL_COMMUNITY): Payer: Self-pay | Admitting: Internal Medicine

## 2015-10-08 DIAGNOSIS — R0600 Dyspnea, unspecified: Secondary | ICD-10-CM

## 2015-10-08 DIAGNOSIS — R002 Palpitations: Secondary | ICD-10-CM

## 2015-10-08 DIAGNOSIS — R739 Hyperglycemia, unspecified: Secondary | ICD-10-CM | POA: Diagnosis not present

## 2015-10-08 DIAGNOSIS — R0609 Other forms of dyspnea: Secondary | ICD-10-CM

## 2015-10-08 DIAGNOSIS — R0789 Other chest pain: Secondary | ICD-10-CM

## 2015-10-08 LAB — DIFFERENTIAL
Basophils Absolute: 0 10*3/uL (ref 0.0–0.1)
Basophils Relative: 0 %
EOS ABS: 0.1 10*3/uL (ref 0.0–0.7)
EOS PCT: 1 %
LYMPHS ABS: 2.7 10*3/uL (ref 0.7–4.0)
Lymphocytes Relative: 38 %
Monocytes Absolute: 0.8 10*3/uL (ref 0.1–1.0)
Monocytes Relative: 11 %
NEUTROS PCT: 50 %
Neutro Abs: 3.5 10*3/uL (ref 1.7–7.7)

## 2015-10-08 LAB — COMPREHENSIVE METABOLIC PANEL
ALBUMIN: 2.8 g/dL — AB (ref 3.5–5.0)
ALK PHOS: 116 U/L (ref 38–126)
ALT: 16 U/L (ref 14–54)
AST: 24 U/L (ref 15–41)
Anion gap: 7 (ref 5–15)
BUN: 8 mg/dL (ref 6–20)
CHLORIDE: 106 mmol/L (ref 101–111)
CO2: 24 mmol/L (ref 22–32)
CREATININE: 0.33 mg/dL — AB (ref 0.44–1.00)
Calcium: 8.9 mg/dL (ref 8.9–10.3)
GFR calc non Af Amer: >60 mL/min (ref >60)
GLUCOSE: 154 mg/dL — AB (ref 65–99)
Potassium: 3.7 mmol/L (ref 3.5–5.1)
SODIUM: 137 mmol/L (ref 135–145)
Total Bilirubin: 0.8 mg/dL (ref 0.3–1.2)
Total Protein: 5.6 g/dL — ABNORMAL LOW (ref 6.5–8.1)

## 2015-10-08 LAB — LIPID PANEL
Cholesterol: 122 mg/dL (ref 0–200)
HDL: 31 mg/dL — AB (ref >40)
LDL CALC: 64 mg/dL (ref 0–99)
TRIGLYCERIDES: 136 mg/dL (ref <150)
Total CHOL/HDL Ratio: 3.9 RATIO
VLDL: 27 mg/dL (ref 0–40)

## 2015-10-08 LAB — CBC WITH DIFFERENTIAL/PLATELET
BASOS PCT: 1 %
Basophils Absolute: 0.1 10*3/uL (ref 0.0–0.1)
EOS PCT: 2 %
Eosinophils Absolute: 0.1 10*3/uL (ref 0.0–0.7)
HCT: 35.2 % — ABNORMAL LOW (ref 36.0–46.0)
Hemoglobin: 12.2 g/dL (ref 12.0–15.0)
Lymphocytes Relative: 41 %
Lymphs Abs: 2.7 10*3/uL (ref 0.7–4.0)
MCH: 25.8 pg — AB (ref 26.0–34.0)
MCHC: 34.7 g/dL (ref 30.0–36.0)
MCV: 74.4 fL — AB (ref 78.0–100.0)
MONO ABS: 0.8 10*3/uL (ref 0.1–1.0)
Monocytes Relative: 12 %
NEUTROS ABS: 2.9 10*3/uL (ref 1.7–7.7)
NEUTROS PCT: 44 %
PLATELETS: 178 10*3/uL (ref 150–400)
RBC: 4.73 MIL/uL (ref 3.87–5.11)
RDW: 14 % (ref 11.5–15.5)
WBC: 6.6 10*3/uL (ref 4.0–10.5)

## 2015-10-08 LAB — NM MYOCAR MULTI W/SPECT W/WALL MOTION / EF
CHL CUP RESTING HR STRESS: 104 {beats}/min
CSEPED: 5 min
Estimated workload: 1 METS
Exercise duration (sec): 1 s
MPHR: 168 {beats}/min
Peak HR: 115 {beats}/min
Percent HR: 68 %

## 2015-10-08 LAB — TROPONIN I

## 2015-10-08 LAB — BASIC METABOLIC PANEL
ANION GAP: 8 (ref 5–15)
BUN: 9 mg/dL (ref 6–20)
CALCIUM: 9 mg/dL (ref 8.9–10.3)
CO2: 23 mmol/L (ref 22–32)
Chloride: 105 mmol/L (ref 101–111)
Creatinine, Ser: 0.38 mg/dL — ABNORMAL LOW (ref 0.44–1.00)
GFR calc non Af Amer: >60 mL/min (ref >60)
GLUCOSE: 193 mg/dL — AB (ref 65–99)
Potassium: 4 mmol/L (ref 3.5–5.1)
SODIUM: 136 mmol/L (ref 135–145)

## 2015-10-08 LAB — CBC
HCT: 37.5 % (ref 36.0–46.0)
HEMOGLOBIN: 13 g/dL (ref 12.0–15.0)
MCH: 25.6 pg — ABNORMAL LOW (ref 26.0–34.0)
MCHC: 34.7 g/dL (ref 30.0–36.0)
MCV: 73.8 fL — ABNORMAL LOW (ref 78.0–100.0)
Platelets: 205 10*3/uL (ref 150–400)
RBC: 5.08 MIL/uL (ref 3.87–5.11)
RDW: 13.8 % (ref 11.5–15.5)
WBC: 7.3 10*3/uL (ref 4.0–10.5)

## 2015-10-08 LAB — TSH: TSH: <0.01 u[IU]/mL — ABNORMAL LOW (ref 0.350–4.500)

## 2015-10-08 LAB — D-DIMER, QUANTITATIVE: D-Dimer, Quant: <0.27 ug/mL-FEU (ref 0.00–0.48)

## 2015-10-08 LAB — I-STAT TROPONIN, ED: Troponin i, poc: 0.01 ng/mL (ref 0.00–0.08)

## 2015-10-08 LAB — BRAIN NATRIURETIC PEPTIDE: B Natriuretic Peptide: 21.7 pg/mL (ref 0.0–100.0)

## 2015-10-08 MED ORDER — SODIUM CHLORIDE 0.9 % IJ SOLN: 3.0000 mL | Freq: Two times a day (BID) | INTRAMUSCULAR | Status: DC

## 2015-10-08 MED ORDER — METHIMAZOLE 10 MG PO TABS
10.0000 mg | ORAL_TABLET | Freq: Two times a day (BID) | ORAL | Status: DC
  Administered 2015-10-08: 10 mg via ORAL
  Filled 2015-10-08 (×2): qty 1

## 2015-10-08 MED ORDER — METOPROLOL TARTRATE 25 MG PO TABS
25.0000 mg | ORAL_TABLET | Freq: Two times a day (BID) | ORAL | Status: DC
Start: 2015-10-08 — End: 2015-10-08
  Administered 2015-10-08: 25 mg via ORAL
  Filled 2015-10-08: qty 1

## 2015-10-08 MED ORDER — ASPIRIN 81 MG PO TBEC: 81.0000 mg | DELAYED_RELEASE_TABLET | Freq: Every day | ORAL | Status: DC

## 2015-10-08 MED ORDER — ACETAMINOPHEN 325 MG PO TABS: 650.0000 mg | ORAL_TABLET | Freq: Four times a day (QID) | ORAL | Status: DC | PRN

## 2015-10-08 MED ORDER — ACETAMINOPHEN 650 MG RE SUPP: 650.0000 mg | Freq: Four times a day (QID) | RECTAL | Status: DC | PRN

## 2015-10-08 MED ORDER — INFLUENZA VAC SPLIT QUAD 0.5 ML IM SUSY
0.5000 mL | PREFILLED_SYRINGE | INTRAMUSCULAR | Status: AC
  Administered 2015-10-08: 0.5 mL via INTRAMUSCULAR
  Filled 2015-10-08: qty 0.5

## 2015-10-08 MED ORDER — TECHNETIUM TC 99M SESTAMIBI - CARDIOLITE
30.0000 | Freq: Once | INTRAVENOUS | Status: AC | PRN
  Administered 2015-10-08: 11:00:00 30 via INTRAVENOUS

## 2015-10-08 MED ORDER — PERFLUTREN LIPID MICROSPHERE
1.0000 mL | INTRAVENOUS | Status: AC | PRN
  Administered 2015-10-08: 4 mL via INTRAVENOUS
  Filled 2015-10-08: qty 10

## 2015-10-08 MED ORDER — METOPROLOL TARTRATE 25 MG PO TABS: 25.0000 mg | ORAL_TABLET | Freq: Two times a day (BID) | ORAL | Status: DC

## 2015-10-08 MED ORDER — ENOXAPARIN SODIUM 40 MG/0.4ML ~~LOC~~ SOLN
40.0000 mg | SUBCUTANEOUS | Status: DC
  Administered 2015-10-08: 40 mg via SUBCUTANEOUS
  Filled 2015-10-08: qty 0.4

## 2015-10-08 MED ORDER — REGADENOSON 0.4 MG/5ML IV SOLN
INTRAVENOUS | Status: AC
  Administered 2015-10-08: 0.4 mg
  Filled 2015-10-08: qty 5

## 2015-10-08 MED ORDER — NITROGLYCERIN 0.4 MG SL SUBL: 0.4000 mg | SUBLINGUAL_TABLET | SUBLINGUAL | Status: DC | PRN

## 2015-10-08 MED ORDER — ONDANSETRON HCL 4 MG PO TABS: 4.0000 mg | ORAL_TABLET | Freq: Four times a day (QID) | ORAL | Status: DC | PRN

## 2015-10-08 MED ORDER — ASPIRIN 81 MG PO CHEW
324.0000 mg | CHEWABLE_TABLET | Freq: Once | ORAL | Status: AC
  Administered 2015-10-08: 324 mg via ORAL
  Filled 2015-10-08: qty 4

## 2015-10-08 MED ORDER — ONDANSETRON HCL 4 MG/2ML IJ SOLN: 4.0000 mg | Freq: Four times a day (QID) | INTRAMUSCULAR | Status: DC | PRN

## 2015-10-08 MED ORDER — METHIMAZOLE 10 MG PO TABS: 10.0000 mg | ORAL_TABLET | Freq: Two times a day (BID) | ORAL | Status: DC

## 2015-10-08 MED ORDER — TECHNETIUM TC 99M SESTAMIBI GENERIC - CARDIOLITE
10.0000 | Freq: Once | INTRAVENOUS | Status: AC | PRN
  Administered 2015-10-08: 10 via INTRAVENOUS

## 2015-10-08 MED ORDER — ASPIRIN EC 81 MG PO TBEC
81.0000 mg | DELAYED_RELEASE_TABLET | Freq: Every day | ORAL | Status: DC
  Administered 2015-10-08: 81 mg via ORAL
  Filled 2015-10-08: qty 1

## 2015-10-08 MED ORDER — REGADENOSON 0.4 MG/5ML IV SOLN
0.4000 mg | Freq: Once | INTRAVENOUS | Status: DC
  Filled 2015-10-08: qty 5

## 2015-10-08 NOTE — Consult Note (Signed)
CARDIOLOGY CONSULT NOTE   Patient ID: Kristin Wilson MRN: [id number] DOB/AGE: 1961-09-24 54 y.o.  Admit date: 10/08/2015  Primary Physician   No PCP Per Patient Primary Cardiologist   New Reason for Consultation  Chest pain and SOB  HPI: Kristin Wilson is a 54 y.o. female with a history of hypertension and hyperthyroidism who presented to [facility] ED for evaluation of worsening chest pain and shortness of breath for the past few days.  She is not taking Tapazole for the last one year and atenolol for the past few months due to financial issue. She does not have insurance. She came to ED for worsening exertional chest tightness and shortness of breath. She said her chest tightness at upper substernal area. No other associated symptoms. She also admits to having intermittent orthopnea as well as bilateral lower extremity edema L > R. The patient denies nausea, vomiting, fever,palpitations, PND, dizziness, syncope, cough, congestion, abdominal pain, hematochezia, melena.   Denies history of smoking or illicit drug use. Occasional alcohol drinking in a few months. Her Wilson had a stent placement at her age of 9. 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   In ED, EKG chest x-ray and cardiac markers were negative. She was admitted for rule out. D-dimer negative. Pending BNP, TSH, HgbA1C and echo.   Past Medical History  Diagnosis Date  . Hypertension   . Hyperthyroidism      Past Surgical History  Procedure Laterality Date  . Cholecystecomy    . Tubal ligation    . Cholecystectomy      No Known Allergies  I have reviewed the patient's current medications . aspirin EC  81 mg Oral Daily  . enoxaparin (LOVENOX) injection  40 mg Subcutaneous Q24H  . Influenza vac split quadrivalent PF  0.5 mL Intramuscular Tomorrow-1000  . sodium chloride  3 mL Intravenous Q12H     acetaminophen **OR** acetaminophen, nitroGLYCERIN, ondansetron **OR**  ondansetron (ZOFRAN) IV  Prior to Admission medications   Medication Sig Start Date End Date Taking? Authorizing Provider  ketorolac (TORADOL) 10 MG tablet Take 1 tablet (10 mg total) by mouth every 8 (eight) hours. 09/28/15  Yes Jenise V Bacon [name], PA-C  HYDROcodone-acetaminophen (NORCO) 5-325 MG tablet Take 1 tablet by mouth every 8 (eight) hours as needed for moderate pain. Patient not taking: Reported on 10/08/2015 09/28/15   [name] [name], PA-C  orphenadrine (NORFLEX) 100 MG tablet Take 1 tablet (100 mg total) by mouth 2 (two) times daily as needed for muscle spasms. Patient not taking: Reported on 10/08/2015 09/28/15   Melvenia Needles, PA-C     Social History   Social History  . Marital Status: Divorced    Spouse Name: N/A  . Number of Children: N/A  . Years of Education: N/A   Occupational History  . Not on file.   Social History Main Topics  . Smoking status: Never Smoker   . Smokeless tobacco: Not on file  . Alcohol Use: No  . Drug Use: No  . Sexual Activity: Not on file   Other Topics Concern  . Not on file   Social History Narrative    No family status information on file.   Family History  Problem Relation Age of Onset  . Heart failure Wilson   . Diabetic kidney disease Paternal Grandmother   . CAD Paternal Grandmother      Her Wilson had a stent placement  at her age of 36. Kristin Wilson from dad sided headache had attack at age 88 and CABG.   ROS:  Full 14 point review of systems complete and found to be negative unless listed above.  Physical Exam: Blood pressure 138/67, pulse 82, temperature 97.9 F (36.6 C), temperature source Oral, resp. rate 20, height 5\' 2"  (1.575 m), weight 179 lb 3.2 oz (81.285 kg), last menstrual period 10/07/2015, SpO2 100 %.  General: Well developed, well nourished, female in no acute distress Head: Eyes PERRLA, No xanthomas. Normocephalic and atraumatic, oropharynx without edema or exudate.  Lungs: Resp  regular and unlabored, CTA. Heart: RRR no s3, s4, or systolic murmurs..   Neck: No carotid bruits. No lymphadenopathy. No JVD. Abdomen: Bowel sounds present, abdomen soft.  Diffuse tender at LUQ without masses or hernias noted. Msk:  No spine or cva tenderness. No weakness, no joint deformities or effusions. Extremities: No clubbing, cyanosis. Very mild L LE edema. DP/PT/Radials 2+ and equal bilaterally. Neuro: Alert and oriented X 3. No focal deficits noted. Psych:  Good affect, responds appropriately Skin: No rashes or lesions noted.  Labs:   Lab Results  Component Value Date   WBC 6.6 10/08/2015   HGB 12.2 10/08/2015   HCT 35.2* 10/08/2015   MCV 74.4* 10/08/2015   PLT 178 10/08/2015   No results for input(s): INR in the last 72 hours.  Recent Labs Lab 10/08/15 0635  NA 137  K 3.7  CL 106  CO2 24  BUN 8  CREATININE 0.33*  CALCIUM 8.9  PROT 5.6*  BILITOT 0.8  ALKPHOS 116  ALT 16  AST 24  GLUCOSE 154*  ALBUMIN 2.8*   No results found for: MG  Recent Labs  10/08/15 0635  TROPONINI <0.03    Recent Labs  10/07/15 2347  TROPIPOC 0.01   No results found for: PROBNP No results found for: CHOL, HDL, LDLCALC, TRIG Lab Results  Component Value Date   DDIMER <0.27 10/08/2015    Echo: Pending  ECG:  Vent. rate 88 BPM PR interval 116 ms QRS duration 90 ms QT/QTc 374/452 ms P-R-T axes 9 34 36  Radiology:  Dg Chest 2 View  10/07/2015  CLINICAL DATA:  Palpitations and shortness of breath EXAM: CHEST  2 VIEW COMPARISON:  11/10/2014 FINDINGS: Normal heart size and mediastinal contours. No acute infiltrate or edema. No effusion or pneumothorax. No acute osseous findings. Cholecystectomy changes. IMPRESSION: No evidence of acute cardiopulmonary disease. Electronically Signed   By: Monte Fantasia M.D.   On: 10/07/2015 23:53    ASSESSMENT AND PLAN:     1. DOE (dyspnea on exertion) - Worsening dyspnea on exertion and with chest tightness. Unknown etiology.  Nonsmoker. D-dimer negative. Chest x-ray without acute cardiopulmonary disease. EKG nonischemic. - Suspect she has a systolic murmur. Pending BNP, TSH  and echo.  Will also get lipid panel for risk stratification.  - Will get Myoview today.   2. Hx Hypertension - Previously on atenolol. He has been not taking for the past few months due to financial issues. Blood pressure was 160/74 at presentation, now improved to 138/67. Not on any medication. Will need case manager consult for outpatient follow-up and medication prescription.  3. Hyperglycemia - Pending hemoglobin A1c  4. Hyperthyroidism - Pending TSH   Signed: Bhagat,Bhavinkumar, PA 10/08/2015, 7:49 AM Pager [phone]  Co-Sign MD  As above, patient seen and examined. Briefly she is a 54 year old female with past medical history of hypertension, hyperthyroidism for evaluation of dyspnea  and chest pain. No prior cardiac history. Over the last several days the patient notes increased dyspnea on exertion. She had an episode of orthopnea by her report. Occasional minimal pedal edema. She has chest tightness when she has her dyspnea that increases with inspiration. She was admitted and cardiology asked to evaluate. Her TSH is low and she is not taking her thyroid medication. Enzymes are negative. BNP normal. Electrocardiogram shows no ST changes. D-dimer normal. Chest x-ray negative. Etiology of dyspnea unclear. Plan echocardiogram to assess LV function. Plan stress nuclear study to exclude ischemia. Management of hyperthyroidism, microcytic anemia and hyperglycemia per primary care. Given hyperthyroidism would add low-dose beta blocker following nuclear study. Kirk Ruths

## 2015-10-08 NOTE — Care Management Note (Addendum)
Case Management Note  Patient Details  Name: Kristin Wilson MRN: [id number] Date of Birth: [birth year]  Subjective/Objective:   Pt admitted for Chest Pian. Pt is without insurance and has to be established with MD @ [facility] Pharmacy is on site. Pt will only be able to use Pharmacy once she is established. The Pharmacy can only be utilized by the clinics pharmacy Rx's. Appointment placed on AVS form. Pharmacy hours M/W/Th 8:30-5:00pm, Tuesday open from 8:30 to 8:00 pm and Friday 8am -1 pm. CM will make pt aware.                   Action/Plan: Pt will need to be placed on all generic medications once stable for d/c if possible. Pt will need to utilize [facility] $4.00 list for cost conscious purposes. CM did provide pt with Good Rx coupon. Cost around $16.00 and pt is aware. No further needs at this time.    Expected Discharge Date:                  Expected Discharge Plan:  Home/Self Care  In-House Referral:  NA  Discharge planning Services  CM Consult, Follow-up appt scheduled, [facility], Medication Assistance  Post Acute Care Choice:  NA Choice offered to:  NA  DME Arranged:  N/A DME Agency:  NA  HH Arranged:  NA HH Agency:  NA  Status of Service:  Completed, signed off  Medicare Important Message Given:    Date Medicare IM Given:    Medicare IM give by:    Date Additional Medicare IM Given:    Additional Medicare Important Message give by:     If discussed at [place] of Stay Meetings, dates discussed:    Additional Comments:  Bethena Roys, RN 10/08/2015, 12:03 PM

## 2015-10-08 NOTE — Discharge Instructions (Signed)
Follow with Primary MD [facility] in [place], [place] checked  by Primary MD next visit.    Activity: As tolerated with Full fall precautions use walker/cane & assistance as needed   Disposition Home    Diet: Heart Healthy  , with feeding assistance and aspiration precautions.  For Heart failure patients - Check your Weight same time everyday, if you gain over 2 pounds, or you develop in leg swelling, experience more shortness of breath or chest pain, call your Primary MD immediately. Follow Cardiac Low Salt Diet and 1.5 lit/day fluid restriction.   On your next visit with your primary care physician please Get Medicines reviewed and adjusted.   Please request your Prim.MD to go over all Hospital Tests and Procedure/Radiological results at the follow up, please get all Hospital records sent to your Prim MD by signing hospital release before you go home.   If you experience worsening of your admission symptoms, develop shortness of breath, life threatening emergency, suicidal or homicidal thoughts you must seek medical attention immediately by calling 911 or calling your MD immediately  if symptoms less severe.  You Must read complete instructions/literature along with all the possible adverse reactions/side effects for all the Medicines you take and that have been prescribed to you. Take any new Medicines after you have completely understood and accpet all the possible adverse reactions/side effects.   Do not drive, operating heavy machinery, perform activities at heights, swimming or participation in water activities or provide baby sitting services if your were admitted for syncope or siezures until you have seen by Primary MD or a Neurologist and advised to do so again.  Do not drive when taking Pain medications.    Do not take more than prescribed Pain, Sleep and Anxiety Medications  Special Instructions: If you have smoked or chewed Tobacco  in the last 2 yrs please  stop smoking, stop any regular Alcohol  and or any Recreational drug use.  Wear Seat belts while driving.   Please note  You were cared for by a hospitalist during your hospital stay. If you have any questions about your discharge medications or the care you received while you were in the hospital after you are discharged, you can call the unit and asked to speak with the hospitalist on call if the hospitalist that took care of you is not available. Once you are discharged, your primary care physician will handle any further medical issues. Please note that NO REFILLS for any discharge medications will be authorized once you are discharged, as it is imperative that you return to your primary care physician (or establish a relationship with a primary care physician if you do not have one) for your aftercare needs so that they can reassess your need for medications and monitor your lab values.  Nonspecific Chest Pain  Chest pain can be caused by many different conditions. There is always a chance that your pain could be related to something serious, such as a heart attack or a blood clot in your lungs. Chest pain can also be caused by conditions that are not life-threatening. If you have chest pain, it is very important to follow up with your health care provider. CAUSES  Chest pain can be caused by:  Heartburn.  Pneumonia or bronchitis.  Anxiety or stress.  Inflammation around your heart (pericarditis) or lung (pleuritis or pleurisy).  A blood clot in your lung.  A collapsed lung (pneumothorax). It can develop suddenly on  its own (spontaneous pneumothorax) or from trauma to the chest.  Shingles infection (varicella-zoster virus).  Heart attack.  Damage to the bones, muscles, and cartilage that make up your chest wall. This can include:  Bruised bones due to injury.  Strained muscles or cartilage due to frequent or repeated coughing or overwork.  Fracture to one or more  ribs.  Sore cartilage due to inflammation (costochondritis). RISK FACTORS  Risk factors for chest pain may include:  Activities that increase your risk for trauma or injury to your chest.  Respiratory infections or conditions that cause frequent coughing.  Medical conditions or overeating that can cause heartburn.  Heart disease or family history of heart disease.  Conditions or health behaviors that increase your risk of developing a blood clot.  Having had chicken pox (varicella zoster). SIGNS AND SYMPTOMS Chest pain can feel like:  Burning or tingling on the surface of your chest or deep in your chest.  Crushing, pressure, aching, or squeezing pain.  Dull or sharp pain that is worse when you move, cough, or take a deep breath.  Pain that is also felt in your back, neck, shoulder, or arm, or pain that spreads to any of these areas. Your chest pain may come and go, or it may stay constant. DIAGNOSIS Lab tests or other studies may be needed to find the cause of your pain. Your health care provider may have you take a test called an ambulatory ECG (electrocardiogram). An ECG records your heartbeat patterns at the time the test is performed. You may also have other tests, such as:  Transthoracic echocardiogram (TTE). During echocardiography, sound waves are used to create a picture of all of the heart structures and to look at how blood flows through your heart.  Transesophageal echocardiogram (TEE).This is a more advanced imaging test that obtains images from inside your body. It allows your health care provider to see your heart in finer detail.  Cardiac monitoring. This allows your health care provider to monitor your heart rate and rhythm in real time.  Holter monitor. This is a portable device that records your heartbeat and can help to diagnose abnormal heartbeats. It allows your health care provider to track your heart activity for several days, if needed.  Stress tests.  These can be done through exercise or by taking medicine that makes your heart beat more quickly.  Blood tests.  Imaging tests. TREATMENT  Your treatment depends on what is causing your chest pain. Treatment may include:  Medicines. These may include:  Acid blockers for heartburn.  Anti-inflammatory medicine.  Pain medicine for inflammatory conditions.  Antibiotic medicine, if an infection is present.  Medicines to dissolve blood clots.  Medicines to treat coronary artery disease.  Supportive care for conditions that do not require medicines. This may include:  Resting.  Applying heat or cold packs to injured areas.  Limiting activities until pain decreases. HOME CARE INSTRUCTIONS  If you were prescribed an antibiotic medicine, finish it all even if you start to feel better.  Avoid any activities that bring on chest pain.  Do not use any tobacco products, including cigarettes, chewing tobacco, or electronic cigarettes. If you need help quitting, ask your health care provider.  Do not drink alcohol.  Take medicines only as directed by your health care provider.  Keep all follow-up visits as directed by your health care provider. This is important. This includes any further testing if your chest pain does not go away.  If heartburn  is the cause for your chest pain, you may be told to keep your head raised (elevated) while sleeping. This reduces the chance that acid will go from your stomach into your esophagus.  Make lifestyle changes as directed by your health care provider. These may include:  Getting regular exercise. Ask your health care provider to suggest some activities that are safe for you.  Eating a heart-healthy diet. A registered dietitian can help you to learn healthy eating options.  Maintaining a healthy weight.  Managing diabetes, if necessary.  Reducing stress. SEEK MEDICAL CARE IF:  Your chest pain does not go away after treatment.  You have  a rash with blisters on your chest.  You have a fever. SEEK IMMEDIATE MEDICAL CARE IF:   Your chest pain is worse.  You have an increasing cough, or you cough up blood.  You have severe abdominal pain.  You have severe weakness.  You faint.  You have chills.  You have sudden, unexplained chest discomfort.  You have sudden, unexplained discomfort in your arms, back, neck, or jaw.  You have shortness of breath at any time.  You suddenly start to sweat, or your skin gets clammy.  You feel nauseous or you vomit.  You suddenly feel light-headed or dizzy.  Your heart begins to beat quickly, or it feels like it is skipping beats. These symptoms may represent a serious problem that is an emergency. Do not wait to see if the symptoms will go away. Get medical help right away. Call your local emergency services (911 in the [place] Do not drive yourself to the hospital.   This information is not intended to replace advice given to you by your health care provider. Make sure you discuss any questions you have with your health care provider.   Document Released: 08/23/2005 Document Revised: 12/04/2014 Document Reviewed: 06/19/2014 Elsevier Interactive Patient Education [employer].

## 2015-10-08 NOTE — Discharge Summary (Signed)
Kristin Wilson, is a 54 y.o. female  DOB 1961-05-11  MRN [id number].  Admission date:  10/08/2015  Admitting Physician  Rise Patience, MD  Discharge Date:  10/08/2015   Primary MD  No PCP Per [facility] in [place]  Recommendations for primary care physician for things to follow:  - Please follow TSH and adjust methimazole dose as needed - Adjust antihypertensive medication as needed   Admission Diagnosis  Palpitations [R00.2] Chest discomfort [R07.89] Hyperglycemia [R73.9]   Discharge Diagnosis  Palpitations [R00.2] Chest discomfort [R07.89] Hyperglycemia [R73.9]    Principal Problem:   DOE (dyspnea on exertion) Active Problems:   Hyperglycemia      Past Medical History  Diagnosis Date  . Hypertension   . Hyperthyroidism     Past Surgical History  Procedure Laterality Date  . Cholecystecomy    . Tubal ligation    . Cholecystectomy         History of present illness and  Hospital Course:     Kindly see H&P for history of present illness and admission details, please review complete Labs, Consult reports and Test reports for all details in brief  HPI  from the history and physical done on the day of admission 11/11  HPI: Kristin Wilson is a 54 y.o. female with history of hypertension and hyperthyroidism who has not been on any medications for last few months because of financial issues presents to the ER because of increasing shortness of breath over the last above days. Denies any chest pain and productive cough fever or chills. EKG chest x-ray and cardiac markers were negative. Given the history of exertional symptoms patient has been admitted for further management to rule out ACS. Patient states she also has gained significant weight over the last few months. At times patient has noticed increasing lower extremity edema. On my exam patient is not in  distress.   Hospital Course   DOE (dyspnea on exertion) - Worsening dyspnea on exertion and with chest tightness. Nonsmoker. D-dimer negative. Chest x-ray without acute cardiopulmonary disease. EKG nonischemic. - Cardiology consult appreciated patient had negative troponins 3, had Myoview stress test done 11/11 which is low risk stress test, 2-D echo done with EF 65%, normal wall motion abnormalities, grade 2 diastolic dysfunction   Hx Hypertension - Patient has been on atenolol, has not been taken it for few month as she does not have PCP, patient started on metoprolol 25 mg oral twice a day for hypertension, as well for her hyperthyroidism.  Hyperglycemia -  hemoglobin A1c pending at time of discharge  Hyperthyroidism - TSH level very low <0.010, patient did not take her methimazole for more than one year due to lack of medication and PCP, will be started on methimazole 10 mg oral twice a day will be given a prescription for 1 month until she is followed by [facility] in [place].  Discharge Condition:  Stable   Follow UP  Follow-up Information    Follow up with [facility] On 11/04/2015.   Specialty:  General Practice   Why:  @ 2:00 pm and arrive by 1:45 pm. Appointment to see Dr. Madolyn Frieze information:   [address] [place] [address] [phone]         Discharge Instructions  and  Discharge Medications     Discharge Instructions    Diet - low sodium heart healthy    Complete by:  As directed      Discharge instructions    Complete by:  As directed   Follow with Primary MD [facility] in [place], CMP checked  by Primary MD next visit.    Activity: As tolerated with Full fall precautions use walker/cane & assistance as needed   Disposition Home    Diet: Heart Healthy  , with feeding assistance and aspiration precautions.  For Heart failure patients - Check your Weight same time everyday, if you  gain over 2 pounds, or you develop in leg swelling, experience more shortness of breath or chest pain, call your Primary MD immediately. Follow Cardiac Low Salt Diet and 1.5 lit/day fluid restriction.   On your next visit with your primary care physician please Get Medicines reviewed and adjusted.   Please request your Prim.MD to go over all Hospital Tests and Procedure/Radiological results at the follow up, please get all Hospital records sent to your Prim MD by signing hospital release before you go home.   If you experience worsening of your admission symptoms, develop shortness of breath, life threatening emergency, suicidal or homicidal thoughts you must seek medical attention immediately by calling 911 or calling your MD immediately  if symptoms less severe.  You Must read complete instructions/literature along with all the possible adverse reactions/side effects for all the Medicines you take and that have been prescribed to you. Take any new Medicines after you have completely understood and accpet all the possible adverse reactions/side effects.   Do not drive, operating heavy machinery, perform activities at heights, swimming or participation in water activities or provide baby sitting services if your were admitted for syncope or siezures until you have seen by Primary MD or a Neurologist and advised to do so again.  Do not drive when taking Pain medications.    Do not take more than prescribed Pain, Sleep and Anxiety Medications  Special Instructions: If you have smoked or chewed Tobacco  in the last 2 yrs please stop smoking, stop any regular Alcohol  and or any Recreational drug use.  Wear Seat belts while driving.   Please note  You were cared for by a hospitalist during your hospital stay. If you have any questions about your discharge medications or the care you received while you were in the hospital after you are discharged, you can call the unit and asked to speak with  the hospitalist on call if the hospitalist that took care of you is not available. Once you are discharged, your primary care physician will handle any further medical issues. Please note that NO REFILLS for any discharge medications will be authorized once you are discharged, as it is imperative that you return to your primary care physician (or establish a relationship with a primary care physician if you do not have one) for your aftercare needs so that they can reassess your need for medications and monitor your lab values.     Increase activity slowly    Complete by:  As directed  Medication List    STOP taking these medications        HYDROcodone-acetaminophen 5-325 MG tablet  Commonly known as:  NORCO     ketorolac 10 MG tablet  Commonly known as:  TORADOL     orphenadrine 100 MG tablet  Commonly known as:  NORFLEX      TAKE these medications        aspirin 81 MG EC tablet  Take 1 tablet (81 mg total) by mouth daily.     methimazole 10 MG tablet  Commonly known as:  TAPAZOLE  Take 1 tablet (10 mg total) by mouth 2 (two) times daily.     metoprolol tartrate 25 MG tablet  Commonly known as:  LOPRESSOR  Take 1 tablet (25 mg total) by mouth 2 (two) times daily.          Diet and Activity recommendation: See Discharge Instructions above   Consults obtained -  Cardiology   Major procedures and Radiology Reports - PLEASE review detailed and final reports for all details, in brief -      Dg Chest 2 View  10/07/2015  CLINICAL DATA:  Palpitations and shortness of breath EXAM: CHEST  2 VIEW COMPARISON:  11/10/2014 FINDINGS: Normal heart size and mediastinal contours. No acute infiltrate or edema. No effusion or pneumothorax. No acute osseous findings. Cholecystectomy changes. IMPRESSION: No evidence of acute cardiopulmonary disease. Electronically Signed   By: Monte Fantasia M.D.   On: 10/07/2015 23:53   Nm Myocar Multi W/spect W/wall Motion /  Ef  10/08/2015  CLINICAL DATA:  Chest pain, hypertension and shortness of Breath EXAM: MYOCARDIAL IMAGING WITH SPECT (REST AND PHARMACOLOGIC-STRESS) GATED LEFT VENTRICULAR WALL MOTION STUDY LEFT VENTRICULAR EJECTION FRACTION TECHNIQUE: Standard myocardial SPECT imaging was performed after resting intravenous injection of 10 mCi Tc-73m sestamibi. Subsequently, intravenous infusion of Lexiscan was performed under the supervision of the Cardiology staff. At peak effect of the drug, 30 mCi Tc-29m sestamibi was injected intravenously and standard myocardial SPECT imaging was performed. Quantitative gated imaging was also performed to evaluate left ventricular wall motion, and estimate left ventricular ejection fraction. COMPARISON:  None. FINDINGS: Perfusion: No reversible ischemia. Small fixed defect is identified within the mid segment of the anterior septum. Wall Motion: Normal left ventricular wall motion. No left ventricular dilation. Left Ventricular Ejection Fraction: 69 % End diastolic volume [id number] ml End systolic volume 33 ml IMPRESSION: 1. No reversible ischemia. There is a small fixed defect involving the mid segment of the anterior septum which is of questionable clinical significance. 2. Normal left ventricular wall motion. 3. Left ventricular ejection fraction 69%% 4. Low-risk stress test findings*. *2012 Appropriate Use Criteria for Coronary Revascularization Focused Update: J Am Coll Cardiol. [id number]. http://content.airportbarriers.com.aspx?articleid=1201161 Electronically Signed   By: Kerby Moors M.D.   On: 10/08/2015 13:06   2-D echo 10/08/2015 The cavity size was normal. Wall thickness was normal. Systolic function was normal. The estimated ejection fraction was in the range of 60% to 65%. Wall motion was normal; there were no regional wall motion abnormalities. Features are consistent with a pseudonormal left ventricular filling pattern, with concomitant abnormal  relaxation and increased filling pressure (grade 2 diastolic dysfunction).  Micro Results    No results found for this or any previous visit (from the past 240 hour(s)).     Today   Subjective:   Kristin Wilson today has no headache,no chest abdominal pain,no new weakness tingling or numbness, feels much better wants to go home today.  Objective:   Blood pressure 169/71, pulse 102, temperature 98.5 F (36.9 C), temperature source Oral, resp. rate 18, height 5\' 2"  (1.575 m), weight 81.285 kg (179 lb 3.2 oz), last menstrual period 10/07/2015, SpO2 99 %.   Intake/Output Summary (Last 24 hours) at 10/08/15 1513 Last data filed at 10/08/15 0300  Gross per 24 hour  Intake      0 ml  Output    300 ml  Net   -300 ml    Exam Awake Alert, Oriented x 3, No new F.N deficits, Normal affect [place].AT,PERRAL Supple Neck,No JVD, No cervical lymphadenopathy appriciated.  Symmetrical Chest wall movement, Good air movement bilaterally, CTAB No Gallops,Rubs or new Murmurs, No Parasternal Heave +ve B.Sounds, Abd Soft, Non tender, No organomegaly appriciated, No rebound -guarding or rigidity. No Cyanosis, Clubbing or edema, No new Rash or bruise  Data Review   CBC w Diff: Lab Results  Component Value Date   WBC 6.6 10/08/2015   WBC 6.8 10/11/2014   HGB 12.2 10/08/2015   HGB 14.4 10/11/2014   HCT 35.2* 10/08/2015   HCT 42.4 10/11/2014   PLT 178 10/08/2015   PLT 230 10/11/2014   LYMPHOPCT 41 10/08/2015   LYMPHOPCT 36.0 08/25/2014   MONOPCT 12 10/08/2015   MONOPCT 9.9 08/25/2014   EOSPCT 2 10/08/2015   EOSPCT 1.9 08/25/2014   BASOPCT 1 10/08/2015   BASOPCT 0.4 08/25/2014    CMP: Lab Results  Component Value Date   NA 137 10/08/2015   NA 139 10/11/2014   K 3.7 10/08/2015   K 4.0 10/11/2014   CL 106 10/08/2015   CL 104 10/11/2014   CO2 24 10/08/2015   CO2 29 10/11/2014   BUN 8 10/08/2015   BUN 13 10/11/2014   CREATININE 0.33* 10/08/2015   CREATININE 0.55* 10/11/2014     PROT 5.6* 10/08/2015   PROT 6.5 08/25/2014   ALBUMIN 2.8* 10/08/2015   ALBUMIN 3.0* 08/25/2014   BILITOT 0.8 10/08/2015   BILITOT 0.5 08/25/2014   ALKPHOS 116 10/08/2015   ALKPHOS 121* 08/25/2014   AST 24 10/08/2015   AST 30 08/25/2014   ALT 16 10/08/2015   ALT 31 08/25/2014  .   Total Time in preparing paper work, data evaluation and todays exam - 35 minutes  Sally-Anne Wamble M.D on 10/08/2015 at 3:13 PM  [facility]   Office  [phone]

## 2015-10-08 NOTE — Progress Notes (Signed)
Patient presented for Lexiscan. Tolerated procedure well. Result to follow.   Lion Fernandez, PAC 

## 2015-10-08 NOTE — ED Provider Notes (Signed)
CSN: [id number]     Arrival date & time 10/07/15  2319 History  By signing my name below, I, Kristin Wilson, attest that this documentation has been prepared under the direction and in the presence of Delora Fuel, MD. Electronically Signed: Eustaquio Wilson, ED Scribe. 10/08/2015. 12:29 AM.  Chief Complaint  Patient presents with  . Shortness of Breath  . Palpitations   The history is provided by the patient. No language interpreter was used.     HPI Comments: Kristin Wilson is a 54 y.o. female with hx HTN who presents to the Emergency Department complaining of gradual onset, intermittent, shortness of breath, 5/10 chest tightness,and palpitations that began approximately 3 days ago. Pt states that the symptoms are only present when she is doing mild exertional activities. She notes that it is taking less effort to bring on the shortness of breath. When pt stops to rest, the symptoms resolved on their own in about 10-15 minutes. Pt took Toradol with some relief. She has not woken up from sleep feeling short of breath. Denies nausea, vomiting, diaphoresis, new leg swelling, or any other associated symptoms. Pt is nonsmoker. Pt had not been on her hypertensive medication for the past few months. FHx of grandmother who had an MI at 24 and mom who had CAD with stents at 63.   Past Medical History  Diagnosis Date  . Hypertension   . Hyperthyroidism    Past Surgical History  Procedure Laterality Date  . Cholecystecomy    . Tubal ligation    . Cholecystectomy     No family history on file. Social History  Substance Use Topics  . Smoking status: Never Smoker   . Smokeless tobacco: None  . Alcohol Use: No   OB History    No data available     Review of Systems  Constitutional: Negative for diaphoresis.  Respiratory: Positive for chest tightness and shortness of breath.   Cardiovascular: Positive for palpitations. Negative for leg swelling.  Gastrointestinal: Negative for nausea and  vomiting.  All other systems reviewed and are negative.   Allergies  Review of patient's allergies indicates no known allergies.  Home Medications   Prior to Admission medications   Medication Sig Start Date End Date Taking? Authorizing Provider  cephALEXin (KEFLEX) 500 MG capsule Take 1 capsule (500 mg total) by mouth 2 (two) times daily. 05/13/15   Gregor Hams, MD  diazepam (VALIUM) 2 MG tablet Take 1 tablet (2 mg total) by mouth every 8 (eight) hours as needed for muscle spasms. 06/18/15   Paulette Blanch, MD  HYDROcodone-acetaminophen (NORCO) 5-325 MG tablet Take 1 tablet by mouth every 8 (eight) hours as needed for moderate pain. 09/28/15   Jenise V Bacon [name], PA-C  ibuprofen (ADVIL,MOTRIN) 800 MG tablet Take 1 tablet (800 mg total) by mouth every 8 (eight) hours as needed for moderate pain. 06/18/15   Paulette Blanch, MD  ketorolac (TORADOL) 10 MG tablet Take 1 tablet (10 mg total) by mouth every 8 (eight) hours. 09/28/15   Jenise V Bacon [name], PA-C  orphenadrine (NORFLEX) 100 MG tablet Take 1 tablet (100 mg total) by mouth 2 (two) times daily as needed for muscle spasms. 09/28/15   Jenise V Bacon [name], PA-C  traMADol (ULTRAM) 50 MG tablet Take 1 tablet (50 mg total) by mouth every 6 (six) hours as needed. 05/05/15 05/04/16  Loney Hering, MD   Triage VItals: BP 160/74 mmHg  Pulse 100  Temp(Src) 98 F (  36.7 C) (Oral)  Resp 16  Wt 181 lb 4 oz (82.214 kg)  SpO2 99%  LMP 10/07/2015   Physical Exam  Constitutional: She is oriented to person, place, and time. She appears well-developed and well-nourished. No distress.  HENT:  Head: Normocephalic and atraumatic.  Eyes: Conjunctivae and EOM are normal. Pupils are equal, round, and reactive to light.  Neck: Normal range of motion. Neck supple. No JVD present.  Cardiovascular: Normal rate, regular rhythm and normal heart sounds.   No murmur heard. Pulmonary/Chest: Effort normal and breath sounds normal. She has no wheezes. She has  no rales. She exhibits no tenderness.  Abdominal: Soft. Bowel sounds are normal. She exhibits no distension and no mass. There is no tenderness.  Musculoskeletal: Normal range of motion. She exhibits edema.  1+ edema  Lymphadenopathy:    She has no cervical adenopathy.  Neurological: She is alert and oriented to person, place, and time. No cranial nerve deficit. She exhibits normal muscle tone. Coordination normal.  Skin: Skin is warm and dry. No rash noted.  Psychiatric: She has a normal mood and affect. Her behavior is normal. Judgment and thought content normal.  Nursing note and vitals reviewed.   ED Course  Procedures (including critical care time)  DIAGNOSTIC STUDIES: Oxygen Saturation is 99% on RA, normal by my interpretation.    COORDINATION OF CARE: 12:26 AM-Discussed treatment plan which includes Aspirin and hospitalization with pt at bedside and pt agreed to plan.   Labs Review Results for orders placed or performed during the hospital encounter of [id number]  Basic metabolic panel  Result Value Ref Range   Sodium 136 135 - 145 mmol/L   Potassium 4.0 3.5 - 5.1 mmol/L   Chloride 105 101 - 111 mmol/L   CO2 23 22 - 32 mmol/L   Glucose, Bld 193 (H) 65 - 99 mg/dL   BUN 9 6 - 20 mg/dL   Creatinine, Ser 0.38 (L) 0.44 - 1.00 mg/dL   Calcium 9.0 8.9 - 10.3 mg/dL   GFR calc non Af Amer >60 >60 mL/min   GFR calc Af Amer >60 >60 mL/min   Anion gap 8 5 - 15  CBC  Result Value Ref Range   WBC 7.3 4.0 - 10.5 K/uL   RBC 5.08 3.87 - 5.11 MIL/uL   Hemoglobin 13.0 12.0 - 15.0 g/dL   HCT 37.5 36.0 - 46.0 %   MCV 73.8 (L) 78.0 - 100.0 fL   MCH 25.6 (L) 26.0 - 34.0 pg   MCHC 34.7 30.0 - 36.0 g/dL   RDW 13.8 11.5 - 15.5 %   Platelets 205 150 - 400 K/uL  Differential  Result Value Ref Range   Neutrophils Relative % 50 %   Neutro Abs 3.5 1.7 - 7.7 K/uL   Lymphocytes Relative 38 %   Lymphs Abs 2.7 0.7 - 4.0 K/uL   Monocytes Relative 11 %   Monocytes Absolute 0.8 0.1 - 1.0 K/uL    Eosinophils Relative 1 %   Eosinophils Absolute 0.1 0.0 - 0.7 K/uL   Basophils Relative 0 %   Basophils Absolute 0.0 0.0 - 0.1 K/uL  I-stat troponin, ED (not at [facility], [facility])  Result Value Ref Range   Troponin i, poc 0.01 0.00 - 0.08 ng/mL   Comment 3           Imaging Review Dg Chest 2 View  10/07/2015  CLINICAL DATA:  Palpitations and shortness of breath EXAM: CHEST  2 VIEW COMPARISON:  11/10/2014  FINDINGS: Normal heart size and mediastinal contours. No acute infiltrate or edema. No effusion or pneumothorax. No acute osseous findings. Cholecystectomy changes. IMPRESSION: No evidence of acute cardiopulmonary disease. Electronically Signed   By: Monte Fantasia M.D.   On: 10/07/2015 23:53   I have personally reviewed and evaluated these images and lab results as part of my medical decision-making.  ECG shows normal sinus rhythm with a rate of 88, no ectopy. Normal axis. Normal P wave. Normal QRS. Normal intervals. Normal ST and T waves. Impression: normal ECG. When compared with ECG of 05/12/2015, no significant changes are seen.   MDM   Final diagnoses:  None    Chest discomfort consistent with angina. Concern for possible unstable angina with episode of rest discomfort. She is currently symptom-free, but Heart Score = 5. She is given aspirin and will be admitted for serial troponins and consideration for cardiology consultation.   I personally performed the services described in this documentation, which was scribed in my presence. The recorded information has been reviewed and is accurate.        Delora Fuel, MD [id number]

## 2015-10-08 NOTE — Progress Notes (Signed)
  Echocardiogram 2D Echocardiogram with definity has been performed.  Darlina Sicilian M 10/08/2015, 1:05 PM

## 2015-10-08 NOTE — H&P (Signed)
[facility] History and Physical  Kristin Wilson [id number] DOB: 13-Apr-1961 DOA: 10/08/2015  Referring physician: Dr. Roxanne Mins. PCP: No PCP Per Patient  Specialists: None.  Chief Complaint: Shortness of breath.  HPI: Kristin Wilson is a 54 y.o. female with history of hypertension and hyperthyroidism who has not been on any medications for last few months because of financial issues presents to the ER because of increasing shortness of breath over the last above days. Denies any chest pain and productive cough fever or chills. EKG chest x-ray and cardiac markers were negative. Given the history of exertional symptoms patient has been admitted for further management to rule out ACS. Patient states she also has gained significant weight over the last few months. At times patient has noticed increasing lower extremity edema. On my exam patient is not in distress.   Review of Systems: As presented in the history of presenting illness, rest negative.  Past Medical History  Diagnosis Date  . Hypertension   . Hyperthyroidism    Past Surgical History  Procedure Laterality Date  . Cholecystecomy    . Tubal ligation    . Cholecystectomy     Social History:  reports that she has never smoked. She does not have any smokeless tobacco history on file. She reports that she does not drink alcohol or use illicit drugs. Where does patient live home. Can patient participate in ADLs? Yes.  No Known Allergies  Family History:  Family History  Problem Relation Age of Onset  . Heart failure Mother   . Diabetic kidney disease Paternal Grandmother   . CAD Paternal Grandmother       Prior to Admission medications   Medication Sig Start Date End Date Taking? Authorizing Provider  ketorolac (TORADOL) 10 MG tablet Take 1 tablet (10 mg total) by mouth every 8 (eight) hours. 09/28/15  Yes Jenise V Bacon [name], PA-C  HYDROcodone-acetaminophen (NORCO) 5-325 MG tablet Take 1 tablet by mouth  every 8 (eight) hours as needed for moderate pain. Patient not taking: Reported on 10/08/2015 09/28/15   [name] [name], PA-C  orphenadrine (NORFLEX) 100 MG tablet Take 1 tablet (100 mg total) by mouth 2 (two) times daily as needed for muscle spasms. Patient not taking: Reported on 10/08/2015 09/28/15   Melvenia Needles, PA-C    Physical Exam: Filed Vitals:   10/08/15 0230 10/08/15 0245 10/08/15 0300 10/08/15 0321  BP: 137/66 129/68 120/73 138/67  Pulse: 83 83 82 82  Temp:    97.9 F (36.6 C)  TempSrc:    Oral  Resp:  23  20  Height:    5\' 2"  (1.575 m)  Weight:    81.285 kg (179 lb 3.2 oz)  SpO2: 95% 94% 95% 100%     General:  Moderately built and nourished.  Eyes: Anicteric no pallor.  ENT: No discharge from the ears eyes nose and mouth.  Neck: No mass felt. No JVD appreciated.  Cardiovascular: S1-S2 heard.  Respiratory: No rhonchi or crepitations.  Abdomen: Soft nontender bowel sounds present.  Skin: No rash.  Musculoskeletal: Mild edema.  Psychiatric: Appears normal.  Neurologic: Alert awake oriented to time place and person. Moves all extremities.  Labs on Admission:  Basic Metabolic Panel:  Recent Labs Lab 10/07/15 2354  NA 136  K 4.0  CL 105  CO2 23  GLUCOSE 193*  BUN 9  CREATININE 0.38*  CALCIUM 9.0   Liver Function Tests: No results for input(s): AST, ALT, ALKPHOS, BILITOT,  PROT, ALBUMIN in the last 168 hours. No results for input(s): LIPASE, AMYLASE in the last 168 hours. No results for input(s): AMMONIA in the last 168 hours. CBC:  Recent Labs Lab 10/07/15 2354  WBC 7.3  NEUTROABS 3.5  HGB 13.0  HCT 37.5  MCV 73.8*  PLT 205   Cardiac Enzymes: No results for input(s): CKTOTAL, CKMB, CKMBINDEX, TROPONINI in the last 168 hours.  BNP (last 3 results)  Recent Labs  10/11/14 1953  BNP 32    ProBNP (last 3 results) No results for input(s): PROBNP in the last 8760 hours.  CBG: No results for input(s): GLUCAP in the  last 168 hours.  Radiological Exams on Admission: Dg Chest 2 View  10/07/2015  CLINICAL DATA:  Palpitations and shortness of breath EXAM: CHEST  2 VIEW COMPARISON:  11/10/2014 FINDINGS: Normal heart size and mediastinal contours. No acute infiltrate or edema. No effusion or pneumothorax. No acute osseous findings. Cholecystectomy changes. IMPRESSION: No evidence of acute cardiopulmonary disease. Electronically Signed   By: Monte Fantasia M.D.   On: 10/07/2015 23:53    EKG: Independently reviewed. Normal sinus rhythm.  Assessment/Plan Principal Problem:   DOE (dyspnea on exertion) Active Problems:   Hyperglycemia   1. Dyspnea on exertion - cause not clear. We will cycle cardiac markers checked d-dimer check BNP check 2-D echo. Further plan based on the tests ordered. 2. History of hyperthyroidism - patient states she used to be on Tapazole which was discontinued last year. Check TSH. Presently has no symptoms to suggest hyperthyroidism. 3. History of hypertension - patient's blood pressure presently are acceptable limits. Closely follow blood pressure trends. 4. Hyperglycemia - check hemoglobin A1c.   DVT Prophylaxis Lovenox.  Code Status: Full code.  Family Communication: Discussed with patient.  Disposition Plan: Admit for observation.    Kristin Jolliff N. [facility] Pager [phone].  If 7PM-7AM, please contact night-coverage [email or web address] Password [facility] 10/08/2015, 4:43 AM

## 2015-10-09 LAB — HEMOGLOBIN A1C
HEMOGLOBIN A1C: 7.7 % — AB (ref 4.8–5.6)
MEAN PLASMA GLUCOSE: 174 mg/dL

## 2016-01-18 ENCOUNTER — Emergency Department
Admission: EM | Admit: 2016-01-18 | Discharge: 2016-01-19 | Disposition: A | Discharge status: Home / Self Care | Payer: Self-pay | Attending: Emergency Medicine | Admitting: Emergency Medicine

## 2016-01-18 ENCOUNTER — Emergency Department: Payer: Self-pay

## 2016-01-18 DIAGNOSIS — Z7982 Long term (current) use of aspirin: Secondary | ICD-10-CM | POA: Insufficient documentation

## 2016-01-18 DIAGNOSIS — M5442 Lumbago with sciatica, left side: Secondary | ICD-10-CM | POA: Insufficient documentation

## 2016-01-18 DIAGNOSIS — R202 Paresthesia of skin: Secondary | ICD-10-CM | POA: Insufficient documentation

## 2016-01-18 DIAGNOSIS — I10 Essential (primary) hypertension: Secondary | ICD-10-CM | POA: Insufficient documentation

## 2016-01-18 DIAGNOSIS — Z79899 Other long term (current) drug therapy: Secondary | ICD-10-CM | POA: Insufficient documentation

## 2016-01-18 DIAGNOSIS — Z3202 Encounter for pregnancy test, result negative: Secondary | ICD-10-CM | POA: Insufficient documentation

## 2016-01-18 LAB — URINALYSIS COMPLETE WITH MICROSCOPIC (ARMC ONLY)
BILIRUBIN URINE: NEGATIVE
Bacteria, UA: NONE SEEN
Glucose, UA: >500 mg/dL — AB
KETONES UR: NEGATIVE mg/dL
LEUKOCYTES UA: NEGATIVE
NITRITE: NEGATIVE
PH: 6 (ref 5.0–8.0)
PROTEIN: NEGATIVE mg/dL
SPECIFIC GRAVITY, URINE: 1.023 (ref 1.005–1.030)

## 2016-01-18 LAB — POCT PREGNANCY, URINE: Preg Test, Ur: NEGATIVE

## 2016-01-18 MED ORDER — TRAMADOL HCL 50 MG PO TABS
50.0000 mg | ORAL_TABLET | Freq: Once | ORAL | Status: AC
  Administered 2016-01-18: 50 mg via ORAL
  Filled 2016-01-18: qty 1

## 2016-01-18 MED ORDER — DIAZEPAM 5 MG PO TABS
5.0000 mg | ORAL_TABLET | Freq: Once | ORAL | Status: AC
  Administered 2016-01-18: 5 mg via ORAL
  Filled 2016-01-18: qty 1

## 2016-01-18 MED ORDER — LIDOCAINE 5 % EX PTCH
1.0000 | MEDICATED_PATCH | CUTANEOUS | Status: DC
  Administered 2016-01-18: 1 via TRANSDERMAL
  Filled 2016-01-18 (×2): qty 1

## 2016-01-18 NOTE — ED Provider Notes (Signed)
[facility] Emergency Department Provider Note  ____________________________________________  Time seen: Approximately 11:26 PM  I have reviewed the triage vital signs and the nursing notes.   HISTORY  Chief Complaint Back Pain    HPI Kristin Wilson is a 55 y.o. female who comes into the hospital today with left hip pain into her leg and foot. The patient also has some numbness to the lateral leg and lateral foot. The patient reports that this has been going on for a couple of months with the pain but has not been numb. She reports of the numbness started a few days ago. She has never seen anyone for the hip pain but has had some lower back pain in the past that she reports improved with medication.The patient reports a couple of weeks ago she fell on her bottom after she slipped on some steps. She reports that she's been trying to take ibuprofen for her pain but it has not been helping. The patient rates her pain 8 out of 10 in intensity. She reports that sometimes the pain gets really bad which is why she came into the hospital tonight. The patient denies any urinary incontinence or retention and no problems with having bowel movements. The patient reports that again it is numb on the left side of her leg as well as her heel and her foot. The patient is also had some occasional swelling in her left leg.   Past Medical History  Diagnosis Date  . Hypertension   . Hyperthyroidism     Patient Active Problem List   Diagnosis Date Noted  . Chest discomfort 10/08/2015  . DOE (dyspnea on exertion) 10/08/2015  . Hyperglycemia 10/08/2015    Past Surgical History  Procedure Laterality Date  . Cholecystecomy    . Tubal ligation    . Cholecystectomy      Current Outpatient Rx  Name  Route  Sig  Dispense  Refill  . aspirin EC 81 MG EC tablet   Oral   Take 1 tablet (81 mg total) by mouth daily.         . methimazole (TAPAZOLE) 10 MG tablet   Oral   Take  1 tablet (10 mg total) by mouth 2 (two) times daily.   60 tablet   0   . metoprolol tartrate (LOPRESSOR) 25 MG tablet   Oral   Take 1 tablet (25 mg total) by mouth 2 (two) times daily.   60 tablet   0     Allergies Review of patient's allergies indicates no known allergies.  Family History  Problem Relation Age of Onset  . Heart failure Mother   . Diabetic kidney disease Paternal Grandmother   . CAD Paternal Grandmother     Social History Social History  Substance Use Topics  . Smoking status: Never Smoker   . Smokeless tobacco: None  . Alcohol Use: No    Review of Systems Constitutional: No fever/chills Eyes: No visual changes. ENT: No sore throat. Cardiovascular: Denies chest pain. Respiratory: Denies shortness of breath. Gastrointestinal: No abdominal pain.  No nausea, no vomiting.  No diarrhea.  No constipation. Genitourinary: Negative for dysuria. Musculoskeletal: Left SI joint pain with some leg pain and swelling Skin: Negative for rash. Neurological: Negative for headaches, focal weakness or numbness.  10-point ROS otherwise negative.  ____________________________________________   PHYSICAL EXAM:  VITAL SIGNS: ED Triage Vitals  Enc Vitals Group     BP 01/18/16 2154 172/76 mmHg  Pulse Rate 01/18/16 2154 100     Resp 01/18/16 2154 18     Temp 01/18/16 2154 97.9 F (36.6 C)     Temp Source 01/18/16 2154 Oral     SpO2 01/18/16 2154 98 %     Weight 01/18/16 2154 174 lb (78.926 kg)     Height 01/18/16 2154 5\' 2"  (1.575 m)     Head Cir --      Peak Flow --      Pain Score 01/18/16 2155 8     Pain Loc --      Pain Edu? --      Excl. in [place]? --     Constitutional: Alert and oriented. Well appearing and in moderate distress. Eyes: Conjunctivae are normal. PERRL. EOMI. Head: Atraumatic. Nose: No congestion/rhinnorhea. Mouth/Throat: Mucous membranes are moist.  Oropharynx non-erythematous. Cardiovascular: Normal rate, regular rhythm. Grossly  normal heart sounds.  Good peripheral circulation. Respiratory: Normal respiratory effort.  No retractions. Lungs CTAB. Gastrointestinal: Soft and nontender. No distention. Positive bowel sounds Musculoskeletal: Tenderness to palpation of left SI joint, with some mild posterior leg pain to palpation, positive straight leg raise on the left  Neurologic:  Normal speech and language. Mildly decreased sensation of the lateral leg and left side of her foot. Strength is intact Skin:  Skin is warm, dry and intact. Psychiatric: Mood and affect are normal.  ____________________________________________   LABS (all labs ordered are listed, but only abnormal results are displayed)  Labs Reviewed  URINALYSIS COMPLETEWITH MICROSCOPIC ([place]) - Abnormal; Notable for the following:    Color, Urine YELLOW (*)    APPearance CLEAR (*)    Glucose, UA >500 (*)    Hgb urine dipstick 1+ (*)    Squamous Epithelial / LPF 0-5 (*)    All other components within normal limits  POC URINE PREG, ED  POCT PREGNANCY, URINE   ____________________________________________  EKG  None ____________________________________________  RADIOLOGY  Lumbar Spine x-ray: No evidence of fracture or subluxation along the lumbar spine Pelvis x-ray: No fracture or dislocation  Venous ultrasound lower extremity: No evidence of left lower extremity DVT ____________________________________________   PROCEDURES  Procedure(s) performed: None  Critical Care performed: No  ____________________________________________   INITIAL IMPRESSION / ASSESSMENT AND PLAN / ED COURSE  Pertinent labs & imaging results that were available during my care of the patient were reviewed by me and considered in my medical decision making (see chart for details).  This is a 55 year old female who comes into the hospital today with some left SI joint pain as well as some numbness to her leg. The patient on exam seems to have some sciatica  which is causing some of her pain as well as numbness. The patient likely has some nerve impingement. She did not have any symptoms of cauda equina and she is ambulatory. I'll give the patient some Valium and some tramadol and place a Lidoderm patch to her SI joint. I will do an x-ray to evaluate for possible fracture as well as an ultrasound to evaluate for DVT. I will reassess the patient when she's receive her medication and her imaging.  The patient reports that her pain is improving. The imaging is unremarkable. She does appear to have some glucose in her urine so I will inform her to follow up with her primary care physician to check her blood sugars. Otherwise the patient will be discharged home to follow-up with her primary care physician. I will also have her follow-up  with the orthopedic surgeon. ____________________________________________   FINAL CLINICAL IMPRESSION(S) / ED DIAGNOSES  Final diagnoses:  Left-sided low back pain with left-sided sciatica  Paresthesia      Loney Hering, MD 01/19/16 0127

## 2016-01-18 NOTE — ED Notes (Signed)
Pt arrived to ED with c/o left lower back pain that radiates into left leg. Pt reports "my left leg feels numb". Pt reports fall several weeks ago.

## 2016-01-18 NOTE — ED Notes (Signed)
Patient transported to Ultrasound via stretcher 

## 2016-01-19 ENCOUNTER — Emergency Department: Payer: Self-pay

## 2016-01-19 MED ORDER — DIAZEPAM 2 MG PO TABS: 2.0000 mg | ORAL_TABLET | Freq: Three times a day (TID) | ORAL | Status: AC | PRN

## 2016-01-19 MED ORDER — TRAMADOL HCL 50 MG PO TABS: 50.0000 mg | ORAL_TABLET | Freq: Four times a day (QID) | ORAL | Status: DC | PRN

## 2016-01-19 MED ORDER — LIDOCAINE 5 % EX PTCH: 1.0000 | MEDICATED_PATCH | CUTANEOUS | Status: AC

## 2016-01-19 MED ORDER — PREDNISONE 20 MG PO TABS
60.0000 mg | ORAL_TABLET | Freq: Once | ORAL | Status: AC
  Administered 2016-01-19: 60 mg via ORAL
  Filled 2016-01-19: qty 3

## 2016-01-19 MED ORDER — PREDNISONE 20 MG PO TABS: 60.0000 mg | ORAL_TABLET | Freq: Every day | ORAL | Status: DC

## 2016-01-19 NOTE — Discharge Instructions (Signed)
Sciatica °Sciatica is pain, weakness, numbness, or tingling along the path of the sciatic nerve. The nerve starts in the lower back and runs down the back of each leg. The nerve controls the muscles in the lower leg and in the back of the knee, while also providing sensation to the back of the thigh, lower leg, and the sole of your foot. Sciatica is a symptom of another medical condition. For instance, nerve damage or certain conditions, such as a herniated disk or bone spur on the spine, pinch or put pressure on the sciatic nerve. This causes the pain, weakness, or other sensations normally associated with sciatica. Generally, sciatica only affects one side of the body. °CAUSES  °· Herniated or slipped disc. °· Degenerative disk disease. °· A pain disorder involving the narrow muscle in the buttocks (piriformis syndrome). °· Pelvic injury or fracture. °· Pregnancy. °· Tumor (rare). °SYMPTOMS  °Symptoms can vary from mild to very severe. The symptoms usually travel from the low back to the buttocks and down the back of the leg. Symptoms can include: °· Mild tingling or dull aches in the lower back, leg, or hip. °· Numbness in the back of the calf or sole of the foot. °· Burning sensations in the lower back, leg, or hip. °· Sharp pains in the lower back, leg, or hip. °· Leg weakness. °· Severe back pain inhibiting movement. °These symptoms may get worse with coughing, sneezing, laughing, or prolonged sitting or standing. Also, being overweight may worsen symptoms. °DIAGNOSIS  °Your caregiver will perform a physical exam to look for common symptoms of sciatica. He or she may ask you to do certain movements or activities that would trigger sciatic nerve pain. Other tests may be performed to find the cause of the sciatica. These may include: °· Blood tests. °· X-rays. °· Imaging tests, such as an MRI or CT scan. °TREATMENT  °Treatment is directed at the cause of the sciatic pain. Sometimes, treatment is not necessary  and the pain and discomfort goes away on its own. If treatment is needed, your caregiver may suggest: °· Over-the-counter medicines to relieve pain. °· Prescription medicines, such as anti-inflammatory medicine, muscle relaxants, or narcotics. °· Applying heat or ice to the painful area. °· Steroid injections to lessen pain, irritation, and inflammation around the nerve. °· Reducing activity during periods of pain. °· Exercising and stretching to strengthen your abdomen and improve flexibility of your spine. Your caregiver may suggest losing weight if the extra weight makes the back pain worse. °· Physical therapy. °· Surgery to eliminate what is pressing or pinching the nerve, such as a bone spur or part of a herniated disk. °HOME CARE INSTRUCTIONS  °· Only take over-the-counter or prescription medicines for pain or discomfort as directed by your caregiver. °· Apply ice to the affected area for 20 minutes, 3-4 times a day for the first 48-72 hours. Then try heat in the same way. °· Exercise, stretch, or perform your usual activities if these do not aggravate your pain. °· Attend physical therapy sessions as directed by your caregiver. °· Keep all follow-up appointments as directed by your caregiver. °· Do not wear high heels or shoes that do not provide proper support. °· Check your mattress to see if it is too soft. A firm mattress may lessen your pain and discomfort. °SEEK IMMEDIATE MEDICAL CARE IF:  °· You lose control of your bowel or bladder (incontinence). °· You have increasing weakness in the lower back, pelvis, buttocks,   or legs.  You have redness or swelling of your back.  You have a burning sensation when you urinate.  You have pain that gets worse when you lie down or awakens you at night.  Your pain is worse than you have experienced in the past.  Your pain is lasting longer than 4 weeks.  You are suddenly losing weight without reason. MAKE SURE YOU:  Understand these  instructions.  Will watch your condition.  Will get help right away if you are not doing well or get worse.   This information is not intended to replace advice given to you by your health care provider. Make sure you discuss any questions you have with your health care provider.   Document Released: 11/07/2001 Document Revised: 08/04/2015 Document Reviewed: 03/24/2012 Elsevier Interactive Patient Education 2016 [employer]  Paresthesia Paresthesia is an abnormal burning or prickling sensation. This sensation is generally felt in the hands, arms, legs, or feet. However, it may occur in any part of the body. Usually, it is not painful. The feeling may be described as:  Tingling or numbness.  Pins and needles.  Skin crawling.  Buzzing.  Limbs falling asleep.  Itching. Most people experience temporary (transient) paresthesia at some time in their lives. Paresthesia may occur when you breathe too quickly (hyperventilation). It can also occur without any apparent cause. Commonly, paresthesia occurs when pressure is placed on a nerve. The sensation quickly goes away after the pressure is removed. For some people, however, paresthesia is a long-lasting (chronic) condition that is caused by an underlying disorder. If you continue to have paresthesia, you may need further medical evaluation. HOME CARE INSTRUCTIONS Watch your condition for any changes. Taking the following actions may help to lessen any discomfort that you are feeling:  Avoid drinking alcohol.  Try acupuncture or massage to help relieve your symptoms.  Keep all follow-up visits as directed by your health care provider. This is important. SEEK MEDICAL CARE IF:  You continue to have episodes of paresthesia.  Your burning or prickling feeling gets worse when you walk.  You have pain, cramps, or dizziness.  You develop a rash. SEEK IMMEDIATE MEDICAL CARE IF:  You feel weak.  You have trouble walking or  moving.  You have problems with speech, understanding, or vision.  You feel confused.  You cannot control your bladder or bowel movements.  You have numbness after an injury.  You faint.   This information is not intended to replace advice given to you by your health care provider. Make sure you discuss any questions you have with your health care provider.   Document Released: 11/03/2002 Document Revised: 03/30/2015 Document Reviewed: 11/09/2014 Elsevier Interactive Patient Education [employer].

## 2016-01-19 NOTE — ED Notes (Signed)
Pt returned from Xray via stretcher.

## 2016-01-19 NOTE — ED Notes (Signed)

## 2016-03-26 ENCOUNTER — Emergency Department: Payer: MEDICAID

## 2016-03-26 ENCOUNTER — Encounter: Payer: Self-pay | Admitting: Emergency Medicine

## 2016-03-26 ENCOUNTER — Emergency Department
Admission: EM | Admit: 2016-03-26 | Discharge: 2016-03-26 | Disposition: A | Discharge status: Home / Self Care | Payer: MEDICAID | Attending: Emergency Medicine | Admitting: Emergency Medicine

## 2016-03-26 DIAGNOSIS — I10 Essential (primary) hypertension: Secondary | ICD-10-CM | POA: Insufficient documentation

## 2016-03-26 DIAGNOSIS — Z7982 Long term (current) use of aspirin: Secondary | ICD-10-CM | POA: Insufficient documentation

## 2016-03-26 DIAGNOSIS — J209 Acute bronchitis, unspecified: Secondary | ICD-10-CM | POA: Insufficient documentation

## 2016-03-26 DIAGNOSIS — J40 Bronchitis, not specified as acute or chronic: Secondary | ICD-10-CM

## 2016-03-26 LAB — CBC
HCT: 41.9 % (ref 35.0–47.0)
Hemoglobin: 14.2 g/dL (ref 12.0–16.0)
MCH: 25.6 pg — ABNORMAL LOW (ref 26.0–34.0)
MCHC: 34 g/dL (ref 32.0–36.0)
MCV: 75.5 fL — ABNORMAL LOW (ref 80.0–100.0)
PLATELETS: 224 10*3/uL (ref 150–440)
RBC: 5.55 MIL/uL — ABNORMAL HIGH (ref 3.80–5.20)
RDW: 14.5 % (ref 11.5–14.5)
WBC: 8.8 10*3/uL (ref 3.6–11.0)

## 2016-03-26 LAB — COMPREHENSIVE METABOLIC PANEL
ALK PHOS: 155 U/L — AB (ref 38–126)
ALT: 22 U/L (ref 14–54)
AST: 25 U/L (ref 15–41)
Albumin: 3.8 g/dL (ref 3.5–5.0)
Anion gap: 10 (ref 5–15)
BILIRUBIN TOTAL: 0.8 mg/dL (ref 0.3–1.2)
BUN: 6 mg/dL (ref 6–20)
CALCIUM: 9 mg/dL (ref 8.9–10.3)
CO2: 25 mmol/L (ref 22–32)
CREATININE: 0.3 mg/dL — AB (ref 0.44–1.00)
Chloride: 100 mmol/L — ABNORMAL LOW (ref 101–111)
GFR calc Af Amer: >60 mL/min (ref >60)
GLUCOSE: 198 mg/dL — AB (ref 65–99)
POTASSIUM: 3.9 mmol/L (ref 3.5–5.1)
Sodium: 135 mmol/L (ref 135–145)
TOTAL PROTEIN: 7.4 g/dL (ref 6.5–8.1)

## 2016-03-26 LAB — LIPASE, BLOOD: Lipase: 17 U/L (ref 11–51)

## 2016-03-26 MED ORDER — PREDNISONE 20 MG PO TABS
60.0000 mg | ORAL_TABLET | Freq: Once | ORAL | Status: AC
  Administered 2016-03-26: 60 mg via ORAL
  Filled 2016-03-26: qty 3

## 2016-03-26 MED ORDER — ALBUTEROL SULFATE (2.5 MG/3ML) 0.083% IN NEBU
2.5000 mg | INHALATION_SOLUTION | Freq: Once | RESPIRATORY_TRACT | Status: AC
  Administered 2016-03-26: 2.5 mg via RESPIRATORY_TRACT
  Filled 2016-03-26: qty 3

## 2016-03-26 MED ORDER — PREDNISONE 20 MG PO TABS: ORAL_TABLET | ORAL | Status: DC

## 2016-03-26 MED ORDER — ALBUTEROL SULFATE HFA 108 (90 BASE) MCG/ACT IN AERS: 2.0000 | INHALATION_SPRAY | RESPIRATORY_TRACT | Status: DC | PRN

## 2016-03-26 MED ORDER — ONDANSETRON 4 MG PO TBDP
4.0000 mg | ORAL_TABLET | Freq: Once | ORAL | Status: DC | PRN
  Filled 2016-03-26: qty 1

## 2016-03-26 MED ORDER — HYDROCOD POLST-CPM POLST ER 10-8 MG/5ML PO SUER: 5.0000 mL | Freq: Two times a day (BID) | ORAL | Status: DC

## 2016-03-26 MED ORDER — HYDROCOD POLST-CPM POLST ER 10-8 MG/5ML PO SUER
5.0000 mL | Freq: Once | ORAL | Status: AC
  Administered 2016-03-26: 5 mL via ORAL
  Filled 2016-03-26: qty 5

## 2016-03-26 MED ORDER — ONDANSETRON 4 MG PO TBDP
4.0000 mg | ORAL_TABLET | Freq: Once | ORAL | Status: AC
  Administered 2016-03-26: 4 mg via ORAL

## 2016-03-26 NOTE — ED Notes (Signed)
Patient reports having a cough for "several days".  Patient states that cough is occasionally productive with yellow sputum.  Patient also reports having nausea and occasional vomiting.  Lungs clear bilaterally.

## 2016-03-26 NOTE — ED Notes (Signed)
Patient reports that she started coughing a couple of days ago. Patient reports that she started running a fever and vomiting today. Patient reports that her fever was 101. Patient reports vomiting times one today.

## 2016-03-26 NOTE — Discharge Instructions (Signed)
1. Take steroid as prescribed (prednisone 60 mg daily 4 days). 2. You may take cough medicine as needed (Tussionex). 3. You may use albuterol inhaler 2 puffs every 4 hours as needed for wheezing or coughing. 4. Return to the ER for worsening symptoms, persistent vomiting, difficulty breathing or other concerns.

## 2016-03-26 NOTE — ED Provider Notes (Signed)
[facility] Emergency Department Provider Note   ____________________________________________  Time seen: Approximately 3:27 AM  I have reviewed the triage vital signs and the nursing notes.   HISTORY  Chief Complaint Cough; Emesis; and Fever    HPI Kristin Wilson is a 55 y.o. female who presents to the ED from home with a chief complaint of cough. Patient reports cough productive of yellow sputum for the past several days. Symptoms associated with nausea and occasional posttussive emesis. Reports fever of 101F last evening.Denies sick contacts. Denies associated symptoms of chills, chest pain, shortness of breath, abdominal pain, diarrhea. Denies recent travel or trauma. Nothing makes her symptoms better or worse.   Past Medical History  Diagnosis Date  . Hypertension   . Hyperthyroidism     Patient Active Problem List   Diagnosis Date Noted  . Chest discomfort 10/08/2015  . DOE (dyspnea on exertion) 10/08/2015  . Hyperglycemia 10/08/2015    Past Surgical History  Procedure Laterality Date  . Cholecystecomy    . Tubal ligation    . Cholecystectomy      Current Outpatient Rx  Name  Route  Sig  Dispense  Refill  . aspirin EC 81 MG EC tablet   Oral   Take 1 tablet (81 mg total) by mouth daily.         . methimazole (TAPAZOLE) 10 MG tablet   Oral   Take 1 tablet (10 mg total) by mouth 2 (two) times daily.   60 tablet   0   . metoprolol tartrate (LOPRESSOR) 25 MG tablet   Oral   Take 1 tablet (25 mg total) by mouth 2 (two) times daily.   60 tablet   0   . diazepam (VALIUM) 2 MG tablet   Oral   Take 1 tablet (2 mg total) by mouth every 8 (eight) hours as needed for muscle spasms.   15 tablet   0   . lidocaine (LIDODERM) 5 %   Transdermal   Place 1 patch onto the skin daily. Remove & Discard patch within 12 hours or as directed by MD   10 patch   0   . predniSONE (DELTASONE) 20 MG tablet   Oral   Take 3 tablets (60  mg total) by mouth daily.   12 tablet   0   . traMADol (ULTRAM) 50 MG tablet   Oral   Take 1 tablet (50 mg total) by mouth every 6 (six) hours as needed.   12 tablet   0     Allergies Review of patient's allergies indicates no known allergies.  Family History  Problem Relation Age of Onset  . Heart failure Mother   . Diabetic kidney disease Paternal Grandmother   . CAD Paternal Grandmother     Social History Social History  Substance Use Topics  . Smoking status: Never Smoker   . Smokeless tobacco: None  . Alcohol Use: No    Review of Systems  Constitutional: Positive for fever. No chills. Eyes: No visual changes. ENT: No sore throat. Cardiovascular: Denies chest pain. Respiratory: Positive for cough. Denies shortness of breath. Gastrointestinal: No abdominal pain.  No nausea, no vomiting.  No diarrhea.  No constipation. Genitourinary: Negative for dysuria. Musculoskeletal: Negative for back pain. Skin: Negative for rash. Neurological: Negative for headaches, focal weakness or numbness.  10-point ROS otherwise negative.  ____________________________________________   PHYSICAL EXAM:  VITAL SIGNS: ED Triage Vitals  Enc Vitals Group  BP 03/26/16 0201 174/88 mmHg     Pulse Rate 03/26/16 0201 113     Resp 03/26/16 0201 20     Temp 03/26/16 0201 98.6 F (37 C)     Temp Source 03/26/16 0201 Oral     SpO2 03/26/16 0201 97 %     Weight 03/26/16 0201 181 lb 11.2 oz (82.419 kg)     Height 03/26/16 0201 5\' 2"  (1.575 m)     Head Cir --      Peak Flow --      Pain Score 03/26/16 0202 7     Pain Loc --      Pain Edu? --      Excl. in [place]? --     Constitutional: Alert and oriented. Well appearing and in mild acute distress. Eyes: Conjunctivae are normal. PERRL. EOMI. Head: Atraumatic. Nose: Congestion/rhinnorhea. Mouth/Throat: Mucous membranes are moist.  Oropharynx non-erythematous. Neck: No stridor.   Cardiovascular: Normal rate, regular rhythm. Grossly  normal heart sounds.  Good peripheral circulation. Respiratory: Normal respiratory effort.  No retractions. Lungs slightly diminished bibasilarly; otherwise clear to auscultation bilaterally. Gastrointestinal: Soft and nontender. No distention. No abdominal bruits. No CVA tenderness. Musculoskeletal: No lower extremity tenderness nor edema.  No joint effusions. Neurologic:  Normal speech and language. No gross focal neurologic deficits are appreciated. No gait instability. Skin:  Skin is warm, dry and intact. No rash noted. Psychiatric: Mood and affect are normal. Speech and behavior are normal.  ____________________________________________   LABS (all labs ordered are listed, but only abnormal results are displayed)  Labs Reviewed  COMPREHENSIVE METABOLIC PANEL - Abnormal; Notable for the following:    Chloride 100 (*)    Glucose, Bld 198 (*)    Creatinine, Ser 0.30 (*)    Alkaline Phosphatase 155 (*)    All other components within normal limits  CBC - Abnormal; Notable for the following:    RBC 5.55 (*)    MCV 75.5 (*)    MCH 25.6 (*)    All other components within normal limits  LIPASE, BLOOD  URINALYSIS COMPLETEWITH MICROSCOPIC ([facility] ONLY)   ____________________________________________  EKG  None ____________________________________________  RADIOLOGY  Chest 2 view (viewed by me, interpreted per Dr. Pascal Lux): No active cardiopulmonary disease. ____________________________________________   PROCEDURES  Procedure(s) performed: None  Critical Care performed: No  ____________________________________________   INITIAL IMPRESSION / ASSESSMENT AND PLAN / ED COURSE  Pertinent labs & imaging results that were available during my care of the patient were reviewed by me and considered in my medical decision making (see chart for details).  54 year old female who presents with cough, congestion, fever. Symptoms consistent with bronchitis. Will initiate albuterol  nebulizer, prednisone, Tussionex and reassess.  ----------------------------------------- 5:13 AM on 03/26/2016 -----------------------------------------  Patient soundly asleep in no acute distress; no further coughing. Strict return precautions given. Patient verbalizes understanding and agrees with plan of care. ____________________________________________   FINAL CLINICAL IMPRESSION(S) / ED DIAGNOSES  Final diagnoses:  Bronchitis      NEW MEDICATIONS STARTED DURING THIS VISIT:  New Prescriptions   No medications on file     Note:  This document was prepared using Dragon voice recognition software and may include unintentional dictation errors.    Paulette Blanch, MD 03/26/16 [phone]

## 2016-10-22 ENCOUNTER — Emergency Department
Admission: EM | Admit: 2016-10-22 | Discharge: 2016-10-22 | Disposition: A | Discharge status: Home / Self Care | Payer: Self-pay | Attending: Emergency Medicine | Admitting: Emergency Medicine

## 2016-10-22 ENCOUNTER — Encounter: Payer: Self-pay | Admitting: Emergency Medicine

## 2016-10-22 DIAGNOSIS — Z79899 Other long term (current) drug therapy: Secondary | ICD-10-CM | POA: Insufficient documentation

## 2016-10-22 DIAGNOSIS — Z7982 Long term (current) use of aspirin: Secondary | ICD-10-CM | POA: Insufficient documentation

## 2016-10-22 DIAGNOSIS — N390 Urinary tract infection, site not specified: Secondary | ICD-10-CM | POA: Insufficient documentation

## 2016-10-22 DIAGNOSIS — I10 Essential (primary) hypertension: Secondary | ICD-10-CM | POA: Insufficient documentation

## 2016-10-22 HISTORY — DX: Sciatica, unspecified side: M54.30

## 2016-10-22 LAB — URINALYSIS COMPLETE WITH MICROSCOPIC (ARMC ONLY)
Bacteria, UA: NONE SEEN
Bilirubin Urine: NEGATIVE
Glucose, UA: >500 mg/dL — AB
KETONES UR: NEGATIVE mg/dL
Nitrite: NEGATIVE
PH: 7 (ref 5.0–8.0)
PROTEIN: NEGATIVE mg/dL
SPECIFIC GRAVITY, URINE: 1.025 (ref 1.005–1.030)

## 2016-10-22 LAB — GLUCOSE, CAPILLARY: Glucose-Capillary: 371 mg/dL — ABNORMAL HIGH (ref 65–99)

## 2016-10-22 MED ORDER — SULFAMETHOXAZOLE-TRIMETHOPRIM 800-160 MG PO TABS: 1.0000 | ORAL_TABLET | Freq: Two times a day (BID) | ORAL | 0 refills | Status: DC

## 2016-10-22 MED ORDER — PHENAZOPYRIDINE HCL 100 MG PO TABS: 100.0000 mg | ORAL_TABLET | Freq: Three times a day (TID) | ORAL | 0 refills | Status: DC | PRN

## 2016-10-22 NOTE — ED Triage Notes (Signed)
Urine pregnancy Negative unable to log in POCT

## 2016-10-22 NOTE — ED Provider Notes (Signed)
[facility] Emergency Department Provider Note  ____________________________________________   First MD Initiated Contact with Patient 10/22/16 1610     (approximate)  I have reviewed the triage vital signs and the nursing notes.   HISTORY  Chief Complaint Dysuria    HPI Kristin Wilson is a 55 y.o. female is here with complaint of dysuria. Patient states that she began having symptoms couple days ago. She denies any fever, chills, nausea or vomiting. She denies any history of kidney stones. She states that she is not having any prior urinary tract infections. She states her symptoms include urinary frequency. She denies any abdominal pain or flank pain. She rates her pain as an 8 out of 10 at this time.Patient states that she has a history of gestational diabetes.   Past Medical History:  Diagnosis Date  . Hypertension   . Hyperthyroidism   . Sciatica     Patient Active Problem List   Diagnosis Date Noted  . Chest discomfort 10/08/2015  . DOE (dyspnea on exertion) 10/08/2015  . Hyperglycemia 10/08/2015    Past Surgical History:  Procedure Laterality Date  . cholecystecomy    . CHOLECYSTECTOMY    . TUBAL LIGATION      Prior to Admission medications   Medication Sig Start Date End Date Taking? Authorizing Provider  albuterol (PROVENTIL HFA;VENTOLIN HFA) 108 (90 Base) MCG/ACT inhaler Inhale 2 puffs into the lungs every 4 (four) hours as needed for wheezing or shortness of breath. 03/26/16   Paulette Blanch, MD  aspirin EC 81 MG EC tablet Take 1 tablet (81 mg total) by mouth daily. 10/08/15   [name] [name], MD  chlorpheniramine-HYDROcodone (TUSSIONEX PENNKINETIC ER) 10-8 MG/5ML SUER Take 5 mLs by mouth 2 (two) times daily. 03/26/16   Paulette Blanch, MD  diazepam (VALIUM) 2 MG tablet Take 1 tablet (2 mg total) by mouth every 8 (eight) hours as needed for muscle spasms. 01/19/16 01/18/17  Loney Hering, MD  lidocaine (LIDODERM) 5 % Place 1 patch  onto the skin daily. Remove & Discard patch within 12 hours or as directed by MD 01/19/16 01/18/17  Loney Hering, MD  methimazole (TAPAZOLE) 10 MG tablet Take 1 tablet (10 mg total) by mouth 2 (two) times daily. 10/08/15   [name] [name], MD  metoprolol tartrate (LOPRESSOR) 25 MG tablet Take 1 tablet (25 mg total) by mouth 2 (two) times daily. 10/08/15   [name] [name], MD  phenazopyridine (PYRIDIUM) 100 MG tablet Take 1 tablet (100 mg total) by mouth 3 (three) times daily as needed for pain. 10/22/16 10/22/17  [name], PA-C  predniSONE (DELTASONE) 20 MG tablet 3 tablets daily 4 days 03/26/16   Paulette Blanch, MD  sulfamethoxazole-trimethoprim (BACTRIM DS,SEPTRA DS) 800-160 MG tablet Take 1 tablet by mouth 2 (two) times daily. 10/22/16   [name], PA-C  traMADol (ULTRAM) 50 MG tablet Take 1 tablet (50 mg total) by mouth every 6 (six) hours as needed. 01/19/16   Loney Hering, MD    Allergies Patient has no known allergies.  Family History  Problem Relation Age of Onset  . Heart failure Mother   . Diabetic kidney disease Paternal Grandmother   . CAD Paternal Grandmother     Social History Social History  Substance Use Topics  . Smoking status: Never Smoker  . Smokeless tobacco: Never Used  . Alcohol use No    Review of Systems Constitutional: No fever/chills Eyes: No visual  changes. Cardiovascular: Denies chest pain. Respiratory: Denies shortness of breath. Gastrointestinal:  No nausea, no vomiting.   Genitourinary: Positive for dysuria. Musculoskeletal: Negative for back pain. Skin: Negative for rash. Neurological: Negative for headaches, focal weakness or numbness. Endocrine:Positive for gestational diabetes.  10-point ROS otherwise negative.  ____________________________________________   PHYSICAL EXAM:  VITAL SIGNS: ED Triage Vitals  Enc Vitals Group     BP 10/22/16 1549 134/76     Pulse Rate 10/22/16 1549 94     Resp 10/22/16 1549 18      Temp --      Temp src --      SpO2 10/22/16 1549 97 %     Weight 10/22/16 1550 176 lb (79.8 kg)     Height 10/22/16 1550 5\' 2"  (1.575 m)     Head Circumference --      Peak Flow --      Pain Score 10/22/16 1550 8     Pain Loc --      Pain Edu? --      Excl. in [place]? --     Constitutional: Alert and oriented. Well appearing and in no acute distress. Eyes: Conjunctivae are normal. PERRL. EOMI. Head: Atraumatic. Nose: No congestion/rhinnorhea. Neck: No stridor.   Cardiovascular: Normal rate, regular rhythm. Grossly normal heart sounds.  Good peripheral circulation. Respiratory: Normal respiratory effort.  No retractions. Lungs CTAB. Gastrointestinal: Soft and nontender. No distention. No CVA tenderness. Musculoskeletal: No lower extremity tenderness nor edema.  No joint effusions. Neurologic:  Normal speech and language. No gross focal neurologic deficits are appreciated. No gait instability. Skin:  Skin is warm, dry and intact. No rash noted. Psychiatric: Mood and affect are normal. Speech and behavior are normal.  ____________________________________________   LABS (all labs ordered are listed, but only abnormal results are displayed)  Labs Reviewed  URINALYSIS COMPLETEWITH MICROSCOPIC ([facility]) - Abnormal; Notable for the following:       Result Value   Color, Urine COLORLESS (*)    APPearance CLEAR (*)    Glucose, UA >500 (*)    Hgb urine dipstick 1+ (*)    Leukocytes, UA 2+ (*)    Squamous Epithelial / LPF 0-5 (*)    All other components within normal limits  GLUCOSE, CAPILLARY - Abnormal; Notable for the following:    Glucose-Capillary 371 (*)    All other components within normal limits  CBG MONITORING, ED     PROCEDURES  Procedure(s) performed: None  Procedures  Critical Care performed: No  ____________________________________________   INITIAL IMPRESSION / ASSESSMENT AND PLAN / ED COURSE  Pertinent labs & imaging results that were available  during my care of the patient were reviewed by me and considered in my medical decision making (see chart for details).    Clinical Course    Patient was placed on Bactrim DS twice a day for 10 days with Pyridium 3 times a day as needed for dysuria. Patient was made aware that her blood sugar was elevated. Patient states that she had lunch and dessert prior to coming to the emergency room. She will call her primary care doctor at [facility] tomorrow for further instructions. She is aware that she will most likely need more blood work and closer follow-up.  ____________________________________________   FINAL CLINICAL IMPRESSION(S) / ED DIAGNOSES  Final diagnoses:  Acute urinary tract infection      NEW MEDICATIONS STARTED DURING THIS VISIT:  Discharge Medication List as of 10/22/2016  4:36 PM  START taking these medications   Details  phenazopyridine (PYRIDIUM) 100 MG tablet Take 1 tablet (100 mg total) by mouth 3 (three) times daily as needed for pain., Starting Sun 10/22/2016, Until Mon 10/22/2017, Print    sulfamethoxazole-trimethoprim (BACTRIM DS,SEPTRA DS) 800-160 MG tablet Take 1 tablet by mouth 2 (two) times daily., Starting Sun 10/22/2016, Print         Note:  This document was prepared using Dragon voice recognition software and may include unintentional dictation errors.    [name], PA-C 10/22/16 [place], MD 10/22/16 2103

## 2016-10-22 NOTE — ED Triage Notes (Signed)
Back pain and dysuria x 3-4 days. Urine collected in triage

## 2016-10-22 NOTE — Discharge Instructions (Signed)
Take all medication until completely finished. Increase fluids. Tylenol or ibuprofen as needed for pain. Follow-up with your primary care doctor at [facility] if any continued problems.

## 2016-11-22 ENCOUNTER — Encounter: Payer: Self-pay | Admitting: Emergency Medicine

## 2016-11-22 ENCOUNTER — Emergency Department
Admission: EM | Admit: 2016-11-22 | Discharge: 2016-11-22 | Disposition: A | Discharge status: Home / Self Care | Payer: Self-pay | Attending: Emergency Medicine | Admitting: Emergency Medicine

## 2016-11-22 ENCOUNTER — Emergency Department: Payer: Self-pay

## 2016-11-22 DIAGNOSIS — J09X2 Influenza due to identified novel influenza A virus with other respiratory manifestations: Secondary | ICD-10-CM | POA: Insufficient documentation

## 2016-11-22 DIAGNOSIS — I10 Essential (primary) hypertension: Secondary | ICD-10-CM | POA: Insufficient documentation

## 2016-11-22 DIAGNOSIS — J101 Influenza due to other identified influenza virus with other respiratory manifestations: Secondary | ICD-10-CM

## 2016-11-22 DIAGNOSIS — Z79899 Other long term (current) drug therapy: Secondary | ICD-10-CM | POA: Insufficient documentation

## 2016-11-22 DIAGNOSIS — Z7982 Long term (current) use of aspirin: Secondary | ICD-10-CM | POA: Insufficient documentation

## 2016-11-22 LAB — CBC WITH DIFFERENTIAL/PLATELET
Basophils Absolute: 0 10*3/uL (ref 0–0.1)
Basophils Relative: 0 %
Eosinophils Absolute: 0 10*3/uL (ref 0–0.7)
Eosinophils Relative: 0 %
HCT: 37.3 % (ref 35.0–47.0)
HEMOGLOBIN: 12.9 g/dL (ref 12.0–16.0)
LYMPHS ABS: 0.6 10*3/uL — AB (ref 1.0–3.6)
LYMPHS PCT: 11 %
MCH: 25.6 pg — AB (ref 26.0–34.0)
MCHC: 34.7 g/dL (ref 32.0–36.0)
MCV: 73.8 fL — AB (ref 80.0–100.0)
Monocytes Absolute: 0.8 10*3/uL (ref 0.2–0.9)
Monocytes Relative: 14 %
NEUTROS PCT: 75 %
Neutro Abs: 4.4 10*3/uL (ref 1.4–6.5)
Platelets: 211 10*3/uL (ref 150–440)
RBC: 5.05 MIL/uL (ref 3.80–5.20)
RDW: 17.3 % — ABNORMAL HIGH (ref 11.5–14.5)
WBC: 5.9 10*3/uL (ref 3.6–11.0)

## 2016-11-22 LAB — COMPREHENSIVE METABOLIC PANEL
ALK PHOS: 95 U/L (ref 38–126)
ALT: 18 U/L (ref 14–54)
AST: 36 U/L (ref 15–41)
Albumin: 3.5 g/dL (ref 3.5–5.0)
Anion gap: 9 (ref 5–15)
BUN: 9 mg/dL (ref 6–20)
CALCIUM: 9.1 mg/dL (ref 8.9–10.3)
CO2: 26 mmol/L (ref 22–32)
CREATININE: 0.5 mg/dL (ref 0.44–1.00)
Chloride: 95 mmol/L — ABNORMAL LOW (ref 101–111)
Glucose, Bld: 287 mg/dL — ABNORMAL HIGH (ref 65–99)
Potassium: 4.5 mmol/L (ref 3.5–5.1)
Sodium: 130 mmol/L — ABNORMAL LOW (ref 135–145)
Total Bilirubin: 0.7 mg/dL (ref 0.3–1.2)
Total Protein: 6.4 g/dL — ABNORMAL LOW (ref 6.5–8.1)

## 2016-11-22 LAB — INFLUENZA PANEL BY PCR (TYPE A & B)
INFLAPCR: POSITIVE — AB
Influenza B By PCR: NEGATIVE

## 2016-11-22 LAB — LACTIC ACID, PLASMA: Lactic Acid, Venous: 3 mmol/L (ref 0.5–1.9)

## 2016-11-22 MED ORDER — ACETAMINOPHEN 325 MG PO TABS
650.0000 mg | ORAL_TABLET | Freq: Once | ORAL | Status: AC | PRN
  Administered 2016-11-22: 650 mg via ORAL
  Filled 2016-11-22: qty 2

## 2016-11-22 MED ORDER — OSELTAMIVIR PHOSPHATE 75 MG PO CAPS: 75.0000 mg | ORAL_CAPSULE | Freq: Two times a day (BID) | ORAL | 0 refills | Status: AC

## 2016-11-22 MED ORDER — SODIUM CHLORIDE 0.9 % IV BOLUS (SEPSIS)
1000.0000 mL | Freq: Once | INTRAVENOUS | Status: AC
  Administered 2016-11-22: 1000 mL via INTRAVENOUS

## 2016-11-22 MED ORDER — KETOROLAC TROMETHAMINE 30 MG/ML IJ SOLN
30.0000 mg | Freq: Once | INTRAMUSCULAR | Status: AC
Start: 2016-11-22 — End: 2016-11-22
  Administered 2016-11-22: 30 mg via INTRAVENOUS
  Filled 2016-11-22: qty 1

## 2016-11-22 NOTE — ED Notes (Signed)
charge RN notified of lactic acid 3.0

## 2016-11-22 NOTE — ED Provider Notes (Signed)
[facility] Emergency Department Provider Note  Time seen: 1:49 PM  I have reviewed the triage vital signs and the nursing notes.   HISTORY  Chief Complaint Headache    HPI Olukemi A Armani is a 55 y.o. female with a past medical history of hypertension who presents to the emergency department for cough, congestion, fever and headache beginning yesterday. According to the patient since yesterday she has been coughing with a moderate to severe headache, states the headache is worse with coughing. She is also been  congested and running fevers.Denies any abdominal pain or chest pain. Denies any nausea, vomiting, diarrhea. Denies any neck or muscular pains.  Past Medical History:  Diagnosis Date  . Hypertension   . Hyperthyroidism   . Sciatica     Patient Active Problem List   Diagnosis Date Noted  . Chest discomfort 10/08/2015  . DOE (dyspnea on exertion) 10/08/2015  . Hyperglycemia 10/08/2015    Past Surgical History:  Procedure Laterality Date  . cholecystecomy    . CHOLECYSTECTOMY    . TUBAL LIGATION      Prior to Admission medications   Medication Sig Start Date End Date Taking? Authorizing Provider  albuterol (PROVENTIL HFA;VENTOLIN HFA) 108 (90 Base) MCG/ACT inhaler Inhale 2 puffs into the lungs every 4 (four) hours as needed for wheezing or shortness of breath. 03/26/16   Paulette Blanch, MD  aspirin EC 81 MG EC tablet Take 1 tablet (81 mg total) by mouth daily. 10/08/15   [name] [name], MD  chlorpheniramine-HYDROcodone (TUSSIONEX PENNKINETIC ER) 10-8 MG/5ML SUER Take 5 mLs by mouth 2 (two) times daily. 03/26/16   Paulette Blanch, MD  diazepam (VALIUM) 2 MG tablet Take 1 tablet (2 mg total) by mouth every 8 (eight) hours as needed for muscle spasms. 01/19/16 01/18/17  Loney Hering, MD  lidocaine (LIDODERM) 5 % Place 1 patch onto the skin daily. Remove & Discard patch within 12 hours or as directed by MD 01/19/16 01/18/17  Loney Hering, MD   methimazole (TAPAZOLE) 10 MG tablet Take 1 tablet (10 mg total) by mouth 2 (two) times daily. 10/08/15   [name] [name], MD  metoprolol tartrate (LOPRESSOR) 25 MG tablet Take 1 tablet (25 mg total) by mouth 2 (two) times daily. 10/08/15   [name] [name], MD  phenazopyridine (PYRIDIUM) 100 MG tablet Take 1 tablet (100 mg total) by mouth 3 (three) times daily as needed for pain. 10/22/16 10/22/17  [name], PA-C  predniSONE (DELTASONE) 20 MG tablet 3 tablets daily 4 days 03/26/16   Paulette Blanch, MD  sulfamethoxazole-trimethoprim (BACTRIM DS,SEPTRA DS) 800-160 MG tablet Take 1 tablet by mouth 2 (two) times daily. 10/22/16   [name], PA-C  traMADol (ULTRAM) 50 MG tablet Take 1 tablet (50 mg total) by mouth every 6 (six) hours as needed. 01/19/16   Loney Hering, MD    No Known Allergies  Family History  Problem Relation Age of Onset  . Heart failure Mother   . Diabetic kidney disease Paternal Grandmother   . CAD Paternal Grandmother     Social History Social History  Substance Use Topics  . Smoking status: Never Smoker  . Smokeless tobacco: Never Used  . Alcohol use No    Review of Systems Constitutional: Positive for fever Cardiovascular: Negative for chest pain. Respiratory: Negative for shortness of breath. Positive for cough Gastrointestinal: Negative for abdominal pain Genitourinary: Negative for dysuria. Skin: Negative for rash. Neurological: Moderate  headache, worse with coughing. 10-point ROS otherwise negative.  ____________________________________________   PHYSICAL EXAM:  VITAL SIGNS: ED Triage Vitals  Enc Vitals Group     BP 11/22/16 1241 140/73     Pulse Rate 11/22/16 1241 100     Resp 11/22/16 1241 18     Temp 11/22/16 1241 (!) 101.7 F (38.7 C)     Temp Source 11/22/16 1241 Oral     SpO2 11/22/16 1241 95 %     Weight 11/22/16 1241 180 lb (81.6 kg)     Height 11/22/16 1241 5\' 2"  (1.575 m)     Head Circumference --      Peak  Flow --      Pain Score 11/22/16 1244 8     Pain Loc --      Pain Edu? --      Excl. in [place]? --     Constitutional: Alert and oriented. Well appearing and in no distress. Eyes: Normal exam ENT   Head: Normocephalic and atraumatic.No nuchal rigidity.   Nose: Mild congestion   Mouth/Throat: Mucous membranes are moist. No pharyngeal erythema. Cardiovascular: Normal rate, regular rhythm. No murmurs, rubs, or gallops. Respiratory: Normal respiratory effort without tachypnea nor retractions. Breath sounds are clear. Frequent cough during examination. Gastrointestinal: Soft and nontender. No distention.  Musculoskeletal: Nontender with normal range of motion in all extremities. Neurologic:  Normal speech and language. No gross focal neurologic deficits Skin:  Skin is warm, dry and intact.  Psychiatric: Mood and affect are normal.   ____________________________________________     RADIOLOGY  Chest x-ray negative  ____________________________________________   INITIAL IMPRESSION / ASSESSMENT AND PLAN / ED COURSE  Pertinent labs & imaging results that were available during my care of the patient were reviewed by me and considered in my medical decision making (see chart for details).  The patient presents the emergency department with cough, congestion and fever beginning yesterday. Patient has a frequent cough during examination, states the headache is worse when she coughs. We will check labs, IV hydrate, she with Toradol, and closer monitoring in the emergency department while awaiting results including chest x-ray and influenza screening.  Labs are lactated 3.0 however the patient's influenza screen is positive for influenza A. Patient feeling better after Toradol and IV fluids. We'll discharge the patient home with supportive care. I discussed Tamiflu, patient would prefer to have a Tamiflu prescription.  ____________________________________________   FINAL CLINICAL  IMPRESSION(S) / ED DIAGNOSES  Upper respiratory infection Influenza positive   Harvest Dark, MD 11/22/16 1452

## 2016-11-22 NOTE — ED Triage Notes (Signed)
C/o cough and headache since yesterday.

## 2017-02-03 ENCOUNTER — Encounter: Payer: Self-pay | Admitting: Emergency Medicine

## 2017-02-03 ENCOUNTER — Emergency Department: Payer: PRIVATE HEALTH INSURANCE

## 2017-02-03 ENCOUNTER — Emergency Department
Admission: EM | Admit: 2017-02-03 | Discharge: 2017-02-04 | Disposition: A | Discharge status: Home / Self Care | Payer: PRIVATE HEALTH INSURANCE | Attending: Emergency Medicine | Admitting: Emergency Medicine

## 2017-02-03 DIAGNOSIS — I10 Essential (primary) hypertension: Secondary | ICD-10-CM

## 2017-02-03 DIAGNOSIS — E119 Type 2 diabetes mellitus without complications: Secondary | ICD-10-CM | POA: Insufficient documentation

## 2017-02-03 DIAGNOSIS — Z7982 Long term (current) use of aspirin: Secondary | ICD-10-CM | POA: Insufficient documentation

## 2017-02-03 DIAGNOSIS — R51 Headache: Secondary | ICD-10-CM

## 2017-02-03 DIAGNOSIS — R519 Headache, unspecified: Secondary | ICD-10-CM

## 2017-02-03 DIAGNOSIS — Z79899 Other long term (current) drug therapy: Secondary | ICD-10-CM | POA: Insufficient documentation

## 2017-02-03 HISTORY — DX: Unspecified osteoarthritis, unspecified site: M19.90

## 2017-02-03 HISTORY — DX: Type 2 diabetes mellitus without complications: E11.9

## 2017-02-03 LAB — CBC WITH DIFFERENTIAL/PLATELET
BASOS PCT: 1 %
Basophils Absolute: 0 10*3/uL (ref 0–0.1)
EOS ABS: 0 10*3/uL (ref 0–0.7)
Eosinophils Relative: 0 %
HEMATOCRIT: 43 % (ref 35.0–47.0)
HEMOGLOBIN: 15 g/dL (ref 12.0–16.0)
LYMPHS ABS: 2.3 10*3/uL (ref 1.0–3.6)
Lymphocytes Relative: 37 %
MCH: 25.9 pg — AB (ref 26.0–34.0)
MCHC: 34.9 g/dL (ref 32.0–36.0)
MCV: 74.4 fL — ABNORMAL LOW (ref 80.0–100.0)
MONO ABS: 0.6 10*3/uL (ref 0.2–0.9)
MONOS PCT: 10 %
NEUTROS ABS: 3.2 10*3/uL (ref 1.4–6.5)
Neutrophils Relative %: 52 %
Platelets: 225 10*3/uL (ref 150–440)
RBC: 5.77 MIL/uL — ABNORMAL HIGH (ref 3.80–5.20)
RDW: 15.2 % — AB (ref 11.5–14.5)
WBC: 6.2 10*3/uL (ref 3.6–11.0)

## 2017-02-03 LAB — BASIC METABOLIC PANEL
Anion gap: 8 (ref 5–15)
BUN: 14 mg/dL (ref 6–20)
CALCIUM: 9.5 mg/dL (ref 8.9–10.3)
CO2: 26 mmol/L (ref 22–32)
Chloride: 101 mmol/L (ref 101–111)
Creatinine, Ser: 0.43 mg/dL — ABNORMAL LOW (ref 0.44–1.00)
GFR calc Af Amer: >60 mL/min (ref >60)
GLUCOSE: 303 mg/dL — AB (ref 65–99)
Potassium: 4.3 mmol/L (ref 3.5–5.1)
Sodium: 135 mmol/L (ref 135–145)

## 2017-02-03 LAB — TROPONIN I: Troponin I: <0.03 ng/mL (ref <0.03)

## 2017-02-03 MED ORDER — LIDOCAINE 5 % EX PTCH
1.0000 | MEDICATED_PATCH | CUTANEOUS | Status: DC
  Administered 2017-02-04: 1 via TRANSDERMAL
  Filled 2017-02-03: qty 1

## 2017-02-03 MED ORDER — METOCLOPRAMIDE HCL 5 MG/ML IJ SOLN
10.0000 mg | Freq: Once | INTRAMUSCULAR | Status: AC
  Administered 2017-02-04: 10 mg via INTRAVENOUS
  Filled 2017-02-03: qty 2

## 2017-02-03 MED ORDER — KETOROLAC TROMETHAMINE 30 MG/ML IJ SOLN
30.0000 mg | Freq: Once | INTRAMUSCULAR | Status: AC
  Administered 2017-02-04: 30 mg via INTRAVENOUS
  Filled 2017-02-03: qty 1

## 2017-02-03 MED ORDER — SODIUM CHLORIDE 0.9 % IV BOLUS (SEPSIS)
1000.0000 mL | Freq: Once | INTRAVENOUS | Status: AC
  Administered 2017-02-04: 1000 mL via INTRAVENOUS

## 2017-02-03 MED ORDER — DIPHENHYDRAMINE HCL 50 MG/ML IJ SOLN
25.0000 mg | Freq: Once | INTRAMUSCULAR | Status: AC
  Administered 2017-02-04: 25 mg via INTRAVENOUS
  Filled 2017-02-03: qty 1

## 2017-02-03 NOTE — ED Provider Notes (Signed)
[facility] Emergency Department Provider Note   ____________________________________________   First MD Initiated Contact with Patient 02/03/17 2328     (approximate)  I have reviewed the triage vital signs and the nursing notes.   HISTORY  Chief Complaint Hypertension    HPI Kristin Wilson is a 56 y.o. female who comes into the hospital today with a bad headache. She reports that she's been having a headache since about Thursday. She thought maybe her blood pressure was elevated realized that it was. She's been taking acetaminophen and thousand milligrams every 4-6 hours also for back pain but it has not been helping. The patient reports that she has had headaches when her blood pressure or her blood sugars have been elevated in the past. The patient also takes metformin for her diabetes. The patient reports her blood pressures typically in the 140s over 80s. She rates her head pain a 10 out of 10 in intensity but also has some back pain. She's had some blurred vision and nausea but denies vomiting, photophobia or phonophobia. The patient reports that on Thursday she did have some chest pain but it did resolve. Her blood pressure at that time was 180s over 101. The patient decided to come into the hospital today for evaluation.   Past Medical History:  Diagnosis Date  . Diabetes mellitus without complication ([place])   . Hypertension   . Hyperthyroidism   . Osteoarthritis   . Sciatica     Patient Active Problem List   Diagnosis Date Noted  . Chest discomfort 10/08/2015  . DOE (dyspnea on exertion) 10/08/2015  . Hyperglycemia 10/08/2015    Past Surgical History:  Procedure Laterality Date  . cholecystecomy    . CHOLECYSTECTOMY    . TUBAL LIGATION      Prior to Admission medications   Medication Sig Start Date End Date Taking? Authorizing Provider  albuterol (PROVENTIL HFA;VENTOLIN HFA) 108 (90 Base) MCG/ACT inhaler Inhale 2 puffs into the  lungs every 4 (four) hours as needed for wheezing or shortness of breath. 03/26/16   Paulette Blanch, MD  aspirin EC 81 MG EC tablet Take 1 tablet (81 mg total) by mouth daily. 10/08/15   Albertine Patricia, MD  butalbital-acetaminophen-caffeine (FIORICET, ESGIC) [phone] MG tablet Take 1-2 tablets by mouth every 6 (six) hours as needed for headache. 02/04/17 02/04/18  Loney Hering, MD  chlorpheniramine-HYDROcodone [facility]) 10-8 MG/5ML SUER Take 5 mLs by mouth 2 (two) times daily. 03/26/16   Paulette Blanch, MD  methimazole (TAPAZOLE) 10 MG tablet Take 1 tablet (10 mg total) by mouth 2 (two) times daily. 10/08/15   [name] [name], MD  metoprolol tartrate (LOPRESSOR) 25 MG tablet Take 1 tablet (25 mg total) by mouth 2 (two) times daily. 10/08/15   [name] [name], MD  phenazopyridine (PYRIDIUM) 100 MG tablet Take 1 tablet (100 mg total) by mouth 3 (three) times daily as needed for pain. 10/22/16 10/22/17  [name], PA-C  predniSONE (DELTASONE) 20 MG tablet 3 tablets daily 4 days 03/26/16   Paulette Blanch, MD  sulfamethoxazole-trimethoprim (BACTRIM DS,SEPTRA DS) 800-160 MG tablet Take 1 tablet by mouth 2 (two) times daily. 10/22/16   [name], PA-C  traMADol (ULTRAM) 50 MG tablet Take 1 tablet (50 mg total) by mouth every 6 (six) hours as needed. 01/19/16   Loney Hering, MD    Allergies Percocet [oxycodone-acetaminophen]  Family History  Problem Relation Age of Onset  .  Heart failure Mother   . Diabetic kidney disease Paternal Grandmother   . CAD Paternal Grandmother     Social History Social History  Substance Use Topics  . Smoking status: Never Smoker  . Smokeless tobacco: Never Used  . Alcohol use No    Review of Systems Constitutional: No fever/chills Eyes: blurred vision ENT: No sore throat. Cardiovascular: chest pain. Respiratory: Denies shortness of breath. Gastrointestinal: No abdominal pain.  No nausea, no vomiting.  No diarrhea.  No  constipation. Genitourinary: Negative for dysuria. Musculoskeletal: Negative for back pain. Skin: Negative for rash. Neurological: headache  10-point ROS otherwise negative.  ____________________________________________   PHYSICAL EXAM:  VITAL SIGNS: ED Triage Vitals  Enc Vitals Group     BP 02/03/17 2122 (!) 173/92     Pulse Rate 02/03/17 2122 (!) 105     Resp 02/03/17 2122 18     Temp 02/03/17 2122 98.3 F (36.8 C)     Temp Source 02/03/17 2122 Oral     SpO2 02/03/17 2122 98 %     Weight 02/03/17 2123 177 lb (80.3 kg)     Height 02/03/17 2123 5\' 2"  (1.575 m)     Head Circumference --      Peak Flow --      Pain Score 02/03/17 2123 10     Pain Loc --      Pain Edu? --      Excl. in [place]? --     Constitutional: Alert and oriented. Well appearing and in mild distress. Eyes: Conjunctivae are normal. PERRL. EOMI. Head: Atraumatic. Nose: No congestion/rhinnorhea. Mouth/Throat: Mucous membranes are moist.  Oropharynx non-erythematous. Cardiovascular: Normal rate, regular rhythm. Grossly normal heart sounds.  Good peripheral circulation. Respiratory: Normal respiratory effort.  No retractions. Lungs CTAB. Gastrointestinal: Soft and nontender. No distention. Positive bowel sounds Musculoskeletal: No lower extremity tenderness nor edema.   Neurologic:  Normal speech and language. Cranial nerves II through XII are grossly intact with no focal motor or neuro deficits Skin:  Skin is warm, dry and intact.  Psychiatric: Mood and affect are normal.   ____________________________________________   LABS (all labs ordered are listed, but only abnormal results are displayed)  Labs Reviewed  BASIC METABOLIC PANEL - Abnormal; Notable for the following:       Result Value   Glucose, Bld 303 (*)    Creatinine, Ser 0.43 (*)    All other components within normal limits  CBC WITH DIFFERENTIAL/PLATELET - Abnormal; Notable for the following:    RBC 5.77 (*)    MCV 74.4 (*)    MCH 25.9  (*)    RDW 15.2 (*)    All other components within normal limits  TROPONIN I   ____________________________________________  EKG  ED ECG REPORT I, Loney Hering, the attending physician, personally viewed and interpreted this ECG.   Date: 02/03/2017  EKG Time: 2134  Rate: 98  Rhythm: normal sinus rhythm  Axis: normal  Intervals:none  ST&T Change: none  ____________________________________________  RADIOLOGY  CT head ____________________________________________   PROCEDURES  Procedure(s) performed: None  Procedures  Critical Care performed: No  ____________________________________________   INITIAL IMPRESSION / ASSESSMENT AND PLAN / ED COURSE  Pertinent labs & imaging results that were available during my care of the patient were reviewed by me and considered in my medical decision making (see chart for details).  This is a 56 year old female who comes into the hospital today with some headache and elevated blood pressure. The patient reports  that she has been taking her medication which is metoprolol 25 mg twice a day. Since the patient has been having headache I will give her some Reglan, Benadryl some Toradol and some normal saline. The patient's blood sugars also 303. I will send the patient for a CT scan and then I will reassess the patient's headache and blood pressure after she received some medication and I received the CT results. The patient's blood work is elevated. She denies chest pain at this time but did have some chest pain 2 days ago. I feel that one troponin is appropriate given this pain has resolved. I will reassess the patient once I received her results.  Clinical Course as of Feb 05 124  Nancy Fetter Feb 04, 2017  0111 Unchanged frontal lobe atrophy bilaterally.  No acute findings. CT Head Wo Contrast [AW]    Clinical Course User Index [AW] Loney Hering, MD   The patient's CT scan is unremarkable. Her blood pressure is improved into the  120s over 90s. I explained the results of the patient. I informed her that she should follow back up with her primary care physician. The patient will be discharged home. She understands the plan as stated.  ____________________________________________   FINAL CLINICAL IMPRESSION(S) / ED DIAGNOSES  Final diagnoses:  Acute nonintractable headache, unspecified headache type  Hypertension, unspecified type      NEW MEDICATIONS STARTED DURING THIS VISIT:  New Prescriptions   BUTALBITAL-ACETAMINOPHEN-CAFFEINE (FIORICET, ESGIC) 50-325-40 MG TABLET    Take 1-2 tablets by mouth every 6 (six) hours as needed for headache.     Note:  This document was prepared using Dragon voice recognition software and may include unintentional dictation errors.    Loney Hering, MD 02/04/17 [phone]

## 2017-02-03 NOTE — ED Triage Notes (Signed)
Pt arrives ambulatory to triage with c/o HTN. Pt has hx of same and takes 10 mg of metoprolol x2 a day. Pt states that she also has a headache since Thursday. Pt is in NAD at this time.

## 2017-02-04 MED ORDER — BUTALBITAL-APAP-CAFFEINE 50-325-40 MG PO TABS: 1.0000 | ORAL_TABLET | Freq: Four times a day (QID) | ORAL | 0 refills | Status: DC | PRN

## 2017-02-04 NOTE — Discharge Instructions (Signed)
Please follow-up with your doctor as we have discussed. Please continue taking her medications as they're prescribed. Please ensure the resting and drinking enough fluids. Please return with any other concerns or worsening symptoms.

## 2017-06-06 ENCOUNTER — Emergency Department
Admission: EM | Admit: 2017-06-06 | Discharge: 2017-06-06 | Disposition: A | Discharge status: Home / Self Care | Payer: PRIVATE HEALTH INSURANCE | Attending: Emergency Medicine | Admitting: Emergency Medicine

## 2017-06-06 ENCOUNTER — Emergency Department: Payer: PRIVATE HEALTH INSURANCE

## 2017-06-06 ENCOUNTER — Encounter: Payer: Self-pay | Admitting: Emergency Medicine

## 2017-06-06 DIAGNOSIS — E119 Type 2 diabetes mellitus without complications: Secondary | ICD-10-CM | POA: Insufficient documentation

## 2017-06-06 DIAGNOSIS — Z7982 Long term (current) use of aspirin: Secondary | ICD-10-CM | POA: Insufficient documentation

## 2017-06-06 DIAGNOSIS — Y998 Other external cause status: Secondary | ICD-10-CM | POA: Insufficient documentation

## 2017-06-06 DIAGNOSIS — Z79899 Other long term (current) drug therapy: Secondary | ICD-10-CM | POA: Insufficient documentation

## 2017-06-06 DIAGNOSIS — S46911A Strain of unspecified muscle, fascia and tendon at shoulder and upper arm level, right arm, initial encounter: Secondary | ICD-10-CM | POA: Insufficient documentation

## 2017-06-06 DIAGNOSIS — M7581 Other shoulder lesions, right shoulder: Secondary | ICD-10-CM

## 2017-06-06 DIAGNOSIS — Y9364 Activity, baseball: Secondary | ICD-10-CM | POA: Insufficient documentation

## 2017-06-06 DIAGNOSIS — X509XXA Other and unspecified overexertion or strenuous movements or postures, initial encounter: Secondary | ICD-10-CM | POA: Insufficient documentation

## 2017-06-06 DIAGNOSIS — E079 Disorder of thyroid, unspecified: Secondary | ICD-10-CM | POA: Insufficient documentation

## 2017-06-06 DIAGNOSIS — I10 Essential (primary) hypertension: Secondary | ICD-10-CM | POA: Insufficient documentation

## 2017-06-06 DIAGNOSIS — Y92320 Baseball field as the place of occurrence of the external cause: Secondary | ICD-10-CM | POA: Insufficient documentation

## 2017-06-06 DIAGNOSIS — M75102 Unspecified rotator cuff tear or rupture of left shoulder, not specified as traumatic: Secondary | ICD-10-CM | POA: Insufficient documentation

## 2017-06-06 MED ORDER — CYCLOBENZAPRINE HCL 5 MG PO TABS: 5.0000 mg | ORAL_TABLET | Freq: Every day | ORAL | 0 refills | Status: DC

## 2017-06-06 MED ORDER — DICLOFENAC SODIUM 50 MG PO TBEC: 50.0000 mg | DELAYED_RELEASE_TABLET | Freq: Two times a day (BID) | ORAL | 0 refills | Status: DC

## 2017-06-06 NOTE — Discharge Instructions (Signed)
Your exam and x-ray are consistent with a shoulder strain and rotator cuff tendinitis. Take the anti-inflammatory as directed. Take the muscle relaxant as needed. Apply ice to reduce symptoms. Follow-up with Dr. Roland Rack for ongoing symptoms.

## 2017-06-06 NOTE — ED Provider Notes (Signed)
[facility] Emergency Department Provider Note ____________________________________________  Time seen: 1519  I have reviewed the triage vital signs and the nursing notes.  HISTORY  Chief Complaint  Shoulder Pain  HPI Kristin Wilson is a 56 y.o. female presents to the ED for evaluation of right shoulder pain. Patient describes on Monday, she had delayed onset of pain to the right shoulder. She denies any particular, but does admit that she had pain after 2 separate episodes. First she recalls throwing a baseball during a city baseball game that she was keeping score. Later that evening. She had use some forced to close her back door, which she said sticks it every night. She describes later that evening she had the delayed onset of shoulder pain to the right shoulder. She denies any history of previous shoulder injury, rotator cuff tendinitis, or arthritis. She has not taken any medications or applied any topical analgesics in the interim for pain relief.She describes increased pain with attempts to elevate the shoulder above 90. She also describes having pain with attempts to sleep on that side. Eyes any distal paresthesias.  Past Medical History:  Diagnosis Date  . Diabetes mellitus without complication ([place])   . Hypertension   . Hyperthyroidism   . Osteoarthritis   . Sciatica     Patient Active Problem List   Diagnosis Date Noted  . Chest discomfort 10/08/2015  . DOE (dyspnea on exertion) 10/08/2015  . Hyperglycemia 10/08/2015    Past Surgical History:  Procedure Laterality Date  . cholecystecomy    . CHOLECYSTECTOMY    . TUBAL LIGATION      Prior to Admission medications   Medication Sig Start Date End Date Taking? Authorizing Provider  albuterol (PROVENTIL HFA;VENTOLIN HFA) 108 (90 Base) MCG/ACT inhaler Inhale 2 puffs into the lungs every 4 (four) hours as needed for wheezing or shortness of breath. 03/26/16   Paulette Blanch, MD  aspirin EC 81 MG  EC tablet Take 1 tablet (81 mg total) by mouth daily. 10/08/15   [name], [name], MD  butalbital-acetaminophen-caffeine (FIORICET, ESGIC) [phone] MG tablet Take 1-2 tablets by mouth every 6 (six) hours as needed for headache. 02/04/17 02/04/18  Loney Hering, MD  chlorpheniramine-HYDROcodone [facility] PENNKINETIC ER) 10-8 MG/5ML SUER Take 5 mLs by mouth 2 (two) times daily. 03/26/16   Paulette Blanch, MD  cyclobenzaprine (FLEXERIL) 5 MG tablet Take 1 tablet (5 mg total) by mouth at bedtime. 06/06/17   Shikira Folino, [name], PA-C  diclofenac (VOLTAREN) 50 MG EC tablet Take 1 tablet (50 mg total) by mouth 2 (two) times daily. 06/06/17   Daryus Sowash, [name], PA-C  methimazole (TAPAZOLE) 10 MG tablet Take 1 tablet (10 mg total) by mouth 2 (two) times daily. 10/08/15   [name], [name], MD  metoprolol tartrate (LOPRESSOR) 25 MG tablet Take 1 tablet (25 mg total) by mouth 2 (two) times daily. 10/08/15   [name], [name], MD  phenazopyridine (PYRIDIUM) 100 MG tablet Take 1 tablet (100 mg total) by mouth 3 (three) times daily as needed for pain. 10/22/16 10/22/17  [name], PA-C  predniSONE (DELTASONE) 20 MG tablet 3 tablets daily 4 days 03/26/16   Paulette Blanch, MD  sulfamethoxazole-trimethoprim (BACTRIM DS,SEPTRA DS) 800-160 MG tablet Take 1 tablet by mouth 2 (two) times daily. 10/22/16   [name], PA-C  traMADol (ULTRAM) 50 MG tablet Take 1 tablet (50 mg total) by mouth every 6 (six) hours as needed. 01/19/16  Loney Hering, MD    Allergies Percocet [oxycodone-acetaminophen]  Family History  Problem Relation Age of Onset  . Heart failure Mother   . Diabetic kidney disease Paternal Grandmother   . CAD Paternal Grandmother     Social History Social History  Substance Use Topics  . Smoking status: Never Smoker  . Smokeless tobacco: Never Used  . Alcohol use No    Review of Systems  Constitutional: Negative for fever. Crdiovascular: Negative for chest  pain. Respiratory: Negative for shortness of breath. Musculoskeletal: Negative for back pain. Right shoulder pain as above. Skin: Negative for rash. Neurological: Negative for headaches, focal weakness or numbness. ____________________________________________  PHYSICAL EXAM:  VITAL SIGNS: ED Triage Vitals  Enc Vitals Group     BP 06/06/17 1438 139/75     Pulse Rate 06/06/17 1438 81     Resp 06/06/17 1438 18     Temp 06/06/17 1438 98.2 F (36.8 C)     Temp Source 06/06/17 1438 Oral     SpO2 06/06/17 1438 96 %     Weight 06/06/17 1402 180 lb (81.6 kg)     Height 06/06/17 1402 5\' 2"  (1.575 m)     Head Circumference --      Peak Flow --      Pain Score 06/06/17 1401 10     Pain Loc --      Pain Edu? --      Excl. in [place]? --     Constitutional: Alert and oriented. Well appearing and in no distress. Head: Normocephalic and atraumatic. Cardiovascular: Normal rate, regular rhythm. Normal distal pulses. Respiratory: Normal respiratory effort. No wheezes/rales/rhonchi. Musculoskeletal: Right shoulder without any obvious deformity, dislocation, or sulcus sign. Patient is tender to palpation to the posterior shoulder girdle at the super muscle and insertion. She has decreased active extension and abduction range. She also has some increased pain with rotator cuff resistance testing. No frank rotator cuff weakness, drop arm, or grip changes. She does exhibit positive empty can sign creating pain to the anterior posterior shoulder. Nontender with normal range of motion in all extremities.  Neurologic:  Normal gross sensation. Normal composite fist. Normal UE DTRs bilaterally. No gross focal neurologic deficits are appreciated. Skin:  Skin is warm, dry and intact. No rash noted ____________________________________________   RADIOLOGY  Right Shoulder  IMPRESSION: No acute bony pathology.  I, Jean Skow, [name], personally viewed and evaluated these images (plain radiographs) as part  of my medical decision making, as well as reviewing the written report by the radiologist. ____________________________________________  INITIAL IMPRESSION / [place] / ED COURSE  Patient with ED evaluation of right rotator cuff tendinitis and shoulder strain. She is discharged with prescriptions for diclofenac and cyclobenzaprine. She will follow-up with Dr. Roland Rack for ongoing symptoms.  ____________________________________________  FINAL CLINICAL IMPRESSION(S) / ED DIAGNOSES  Final diagnoses:  Strain of right shoulder, initial encounter  Rotator cuff tendinitis, right       Lorin Hauck, [name], PA-C 06/07/17 0113    Earleen Newport, MD 06/09/17 1500

## 2017-06-06 NOTE — ED Triage Notes (Signed)
Patient presents to the ED with right shoulder pain since Monday.  Patient denies known trauma but she states that her pain is not improving even with ibuprofen.  Patient states, "I think I pulled it out of the socket or something.

## 2017-08-05 ENCOUNTER — Emergency Department: Payer: PRIVATE HEALTH INSURANCE

## 2017-08-05 ENCOUNTER — Emergency Department
Admission: EM | Admit: 2017-08-05 | Discharge: 2017-08-05 | Disposition: A | Discharge status: Home / Self Care | Payer: PRIVATE HEALTH INSURANCE | Attending: Emergency Medicine | Admitting: Emergency Medicine

## 2017-08-05 DIAGNOSIS — Z7982 Long term (current) use of aspirin: Secondary | ICD-10-CM | POA: Insufficient documentation

## 2017-08-05 DIAGNOSIS — E119 Type 2 diabetes mellitus without complications: Secondary | ICD-10-CM | POA: Insufficient documentation

## 2017-08-05 DIAGNOSIS — R110 Nausea: Secondary | ICD-10-CM | POA: Insufficient documentation

## 2017-08-05 DIAGNOSIS — R531 Weakness: Secondary | ICD-10-CM | POA: Insufficient documentation

## 2017-08-05 DIAGNOSIS — Z79899 Other long term (current) drug therapy: Secondary | ICD-10-CM | POA: Insufficient documentation

## 2017-08-05 DIAGNOSIS — R079 Chest pain, unspecified: Secondary | ICD-10-CM | POA: Insufficient documentation

## 2017-08-05 DIAGNOSIS — I10 Essential (primary) hypertension: Secondary | ICD-10-CM | POA: Insufficient documentation

## 2017-08-05 LAB — CBC
HCT: 40.8 % (ref 35.0–47.0)
HEMOGLOBIN: 14.5 g/dL (ref 12.0–16.0)
MCH: 27.6 pg (ref 26.0–34.0)
MCHC: 35.4 g/dL (ref 32.0–36.0)
MCV: 77.8 fL — AB (ref 80.0–100.0)
Platelets: 221 10*3/uL (ref 150–440)
RBC: 5.24 MIL/uL — AB (ref 3.80–5.20)
RDW: 14.2 % (ref 11.5–14.5)
WBC: 6.7 10*3/uL (ref 3.6–11.0)

## 2017-08-05 LAB — BASIC METABOLIC PANEL
Anion gap: 9 (ref 5–15)
BUN: 13 mg/dL (ref 6–20)
CALCIUM: 9.2 mg/dL (ref 8.9–10.3)
CHLORIDE: 102 mmol/L (ref 101–111)
CO2: 24 mmol/L (ref 22–32)
CREATININE: 0.4 mg/dL — AB (ref 0.44–1.00)
GFR calc Af Amer: >60 mL/min (ref >60)
GFR calc non Af Amer: >60 mL/min (ref >60)
GLUCOSE: 243 mg/dL — AB (ref 65–99)
Potassium: 4.1 mmol/L (ref 3.5–5.1)
Sodium: 135 mmol/L (ref 135–145)

## 2017-08-05 LAB — TROPONIN I
Troponin I: <0.03 ng/mL (ref <0.03)
Troponin I: <0.03 ng/mL (ref <0.03)

## 2017-08-05 MED ORDER — ONDANSETRON 4 MG PO TBDP
4.0000 mg | ORAL_TABLET | Freq: Once | ORAL | Status: AC
  Administered 2017-08-05: 4 mg via ORAL
  Filled 2017-08-05: qty 1

## 2017-08-05 MED ORDER — ASPIRIN 81 MG PO CHEW
324.0000 mg | CHEWABLE_TABLET | Freq: Once | ORAL | Status: AC
  Administered 2017-08-05: 324 mg via ORAL
  Filled 2017-08-05: qty 4
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
## 2017-08-05 NOTE — Discharge Instructions (Signed)

## 2017-08-05 NOTE — ED Triage Notes (Addendum)
Patient c/o central chest pain with radiation to back and left arm, nausea and weakness rated at 7/10 and described as tightness

## 2017-08-20 ENCOUNTER — Encounter: Payer: Self-pay | Admitting: Emergency Medicine

## 2017-08-20 ENCOUNTER — Emergency Department
Admission: EM | Admit: 2017-08-20 | Discharge: 2017-08-20 | Disposition: A | Discharge status: Home / Self Care | Payer: No Typology Code available for payment source | Attending: Emergency Medicine | Admitting: Emergency Medicine

## 2017-08-20 ENCOUNTER — Emergency Department: Payer: No Typology Code available for payment source

## 2017-08-20 DIAGNOSIS — I10 Essential (primary) hypertension: Secondary | ICD-10-CM | POA: Insufficient documentation

## 2017-08-20 DIAGNOSIS — Y92410 Unspecified street and highway as the place of occurrence of the external cause: Secondary | ICD-10-CM | POA: Diagnosis not present

## 2017-08-20 DIAGNOSIS — S161XXA Strain of muscle, fascia and tendon at neck level, initial encounter: Secondary | ICD-10-CM

## 2017-08-20 DIAGNOSIS — Z7982 Long term (current) use of aspirin: Secondary | ICD-10-CM | POA: Diagnosis not present

## 2017-08-20 DIAGNOSIS — Y999 Unspecified external cause status: Secondary | ICD-10-CM | POA: Insufficient documentation

## 2017-08-20 DIAGNOSIS — S39012A Strain of muscle, fascia and tendon of lower back, initial encounter: Secondary | ICD-10-CM | POA: Diagnosis not present

## 2017-08-20 DIAGNOSIS — M5412 Radiculopathy, cervical region: Secondary | ICD-10-CM | POA: Diagnosis not present

## 2017-08-20 DIAGNOSIS — Z791 Long term (current) use of non-steroidal anti-inflammatories (NSAID): Secondary | ICD-10-CM | POA: Diagnosis not present

## 2017-08-20 DIAGNOSIS — E119 Type 2 diabetes mellitus without complications: Secondary | ICD-10-CM | POA: Insufficient documentation

## 2017-08-20 DIAGNOSIS — Y939 Activity, unspecified: Secondary | ICD-10-CM | POA: Insufficient documentation

## 2017-08-20 DIAGNOSIS — S199XXA Unspecified injury of neck, initial encounter: Secondary | ICD-10-CM | POA: Diagnosis present

## 2017-08-20 MED ORDER — MELOXICAM 15 MG PO TABS: 15.0000 mg | ORAL_TABLET | Freq: Every day | ORAL | 0 refills | Status: DC

## 2017-08-20 MED ORDER — METHOCARBAMOL 500 MG PO TABS: 500.0000 mg | ORAL_TABLET | Freq: Four times a day (QID) | ORAL | 0 refills | Status: DC

## 2017-08-20 MED ORDER — KETOROLAC TROMETHAMINE 30 MG/ML IJ SOLN
30.0000 mg | Freq: Once | INTRAMUSCULAR | Status: AC
  Administered 2017-08-20: 30 mg via INTRAMUSCULAR
  Filled 2017-08-20: qty 1
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
## 2017-08-23 ENCOUNTER — Emergency Department
Admission: EM | Admit: 2017-08-23 | Discharge: 2017-08-23 | Disposition: A | Discharge status: Home / Self Care | Payer: Self-pay | Attending: Emergency Medicine | Admitting: Emergency Medicine

## 2017-08-23 DIAGNOSIS — G44209 Tension-type headache, unspecified, not intractable: Secondary | ICD-10-CM | POA: Insufficient documentation

## 2017-08-23 DIAGNOSIS — Z7982 Long term (current) use of aspirin: Secondary | ICD-10-CM | POA: Insufficient documentation

## 2017-08-23 DIAGNOSIS — R42 Dizziness and giddiness: Secondary | ICD-10-CM | POA: Insufficient documentation

## 2017-08-23 DIAGNOSIS — E119 Type 2 diabetes mellitus without complications: Secondary | ICD-10-CM | POA: Insufficient documentation

## 2017-08-23 DIAGNOSIS — N3000 Acute cystitis without hematuria: Secondary | ICD-10-CM | POA: Insufficient documentation

## 2017-08-23 DIAGNOSIS — I10 Essential (primary) hypertension: Secondary | ICD-10-CM | POA: Diagnosis not present

## 2017-08-23 DIAGNOSIS — Z79899 Other long term (current) drug therapy: Secondary | ICD-10-CM | POA: Insufficient documentation

## 2017-08-23 LAB — URINALYSIS, ROUTINE W REFLEX MICROSCOPIC
BILIRUBIN URINE: NEGATIVE
HGB URINE DIPSTICK: NEGATIVE
Ketones, ur: NEGATIVE mg/dL
NITRITE: NEGATIVE
Protein, ur: NEGATIVE mg/dL
SPECIFIC GRAVITY, URINE: 1.011 (ref 1.005–1.030)
SQUAMOUS EPITHELIAL / LPF: NONE SEEN
pH: 5 (ref 5.0–8.0)

## 2017-08-23 MED ORDER — CYCLOBENZAPRINE HCL 10 MG PO TABS
5.0000 mg | ORAL_TABLET | Freq: Once | ORAL | Status: AC
  Administered 2017-08-23: 5 mg via ORAL
  Filled 2017-08-23: qty 1

## 2017-08-23 MED ORDER — PREDNISONE 10 MG (21) PO TBPK: ORAL_TABLET | ORAL | 0 refills | Status: DC

## 2017-08-23 MED ORDER — PREDNISONE 20 MG PO TABS
60.0000 mg | ORAL_TABLET | Freq: Once | ORAL | Status: AC
  Administered 2017-08-23: 60 mg via ORAL
  Filled 2017-08-23: qty 3

## 2017-08-23 MED ORDER — CYCLOBENZAPRINE HCL 5 MG PO TABS: 5.0000 mg | ORAL_TABLET | Freq: Three times a day (TID) | ORAL | 0 refills | Status: DC | PRN

## 2017-08-23 MED ORDER — MECLIZINE HCL 12.5 MG PO TABS: 12.5000 mg | ORAL_TABLET | Freq: Three times a day (TID) | ORAL | 0 refills | Status: DC | PRN

## 2017-08-23 NOTE — Discharge Instructions (Signed)
Your exam is essentially normal today. Take the prescription meds as directed. Follow-up with your provider as planned. Return as needed.

## 2017-08-23 NOTE — ED Notes (Signed)
Pt. Verbalizes understanding of d/c instructions, prescriptions, and follow-up. VS stable.Pt. In NAD at time of d/c and denies further concerns regarding this visit. Pt. Stable at the time of departure from the unit, departing unit by the safest and most appropriate manner per that pt condition and limitations. Pt advised to return to the ED at any time for emergent concerns, or for new/worsening symptoms.   
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
## 2017-08-24 ENCOUNTER — Telehealth: Payer: Self-pay | Admitting: Emergency Medicine

## 2017-08-24 NOTE — Telephone Encounter (Addendum)
Called patient and left message asking her to call me.   Patient called me back.  expained need for antibiotic.  Per Dr. Christell Faith keflex 500 mg.  rx called to [address].

## 2017-08-24 NOTE — Telephone Encounter (Signed)
-----   Message from Melvenia Needles, [place] sent at 08/23/2017 11:42 PM EDT ----- Regarding: Need ABX Called to [facility]:  Will you please contact this patient in the morning. She needs a RX for Keflex (#14, Sig: 1 tab BID)  Her urine came back, but I had forgotten to look at it before her discharge.   I also have a culture pending.   Thanks, [employer]

## 2017-08-26 LAB — URINE CULTURE
Culture: >=100000 — AB
Special Requests: NORMAL

## 2017-09-07 ENCOUNTER — Encounter: Payer: Self-pay | Admitting: Emergency Medicine

## 2017-09-07 DIAGNOSIS — Z8249 Family history of ischemic heart disease and other diseases of the circulatory system: Secondary | ICD-10-CM

## 2017-09-07 DIAGNOSIS — Z9049 Acquired absence of other specified parts of digestive tract: Secondary | ICD-10-CM

## 2017-09-07 DIAGNOSIS — N10 Acute tubulo-interstitial nephritis: Secondary | ICD-10-CM | POA: Diagnosis present

## 2017-09-07 DIAGNOSIS — B9620 Unspecified Escherichia coli [E. coli] as the cause of diseases classified elsewhere: Secondary | ICD-10-CM | POA: Diagnosis present

## 2017-09-07 DIAGNOSIS — Z885 Allergy status to narcotic agent status: Secondary | ICD-10-CM

## 2017-09-07 DIAGNOSIS — E871 Hypo-osmolality and hyponatremia: Secondary | ICD-10-CM | POA: Diagnosis present

## 2017-09-07 DIAGNOSIS — M1990 Unspecified osteoarthritis, unspecified site: Secondary | ICD-10-CM | POA: Diagnosis present

## 2017-09-07 DIAGNOSIS — Z7982 Long term (current) use of aspirin: Secondary | ICD-10-CM

## 2017-09-07 DIAGNOSIS — E0590 Thyrotoxicosis, unspecified without thyrotoxic crisis or storm: Secondary | ICD-10-CM | POA: Diagnosis present

## 2017-09-07 DIAGNOSIS — I10 Essential (primary) hypertension: Secondary | ICD-10-CM | POA: Diagnosis present

## 2017-09-07 DIAGNOSIS — Z841 Family history of disorders of kidney and ureter: Secondary | ICD-10-CM

## 2017-09-07 DIAGNOSIS — Z9851 Tubal ligation status: Secondary | ICD-10-CM

## 2017-09-07 DIAGNOSIS — Z79899 Other long term (current) drug therapy: Secondary | ICD-10-CM

## 2017-09-07 DIAGNOSIS — A419 Sepsis, unspecified organism: Principal | ICD-10-CM | POA: Diagnosis present

## 2017-09-07 DIAGNOSIS — Z791 Long term (current) use of non-steroidal anti-inflammatories (NSAID): Secondary | ICD-10-CM

## 2017-09-07 DIAGNOSIS — E119 Type 2 diabetes mellitus without complications: Secondary | ICD-10-CM | POA: Diagnosis present

## 2017-09-07 LAB — URINALYSIS, COMPLETE (UACMP) WITH MICROSCOPIC
BILIRUBIN URINE: NEGATIVE
Glucose, UA: >=500 mg/dL — AB
KETONES UR: 80 mg/dL — AB
NITRITE: POSITIVE — AB
Protein, ur: 100 mg/dL — AB
Specific Gravity, Urine: 1.015 (ref 1.005–1.030)
pH: 5 (ref 5.0–8.0)
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
## 2017-09-08 ENCOUNTER — Encounter: Payer: Self-pay | Admitting: Internal Medicine

## 2017-09-08 ENCOUNTER — Inpatient Hospital Stay
Admission: EM | Admit: 2017-09-08 | Discharge: 2017-09-11 | DRG: 872 | Disposition: A | Discharge status: Home / Self Care | Payer: Self-pay | Attending: Internal Medicine | Admitting: Internal Medicine

## 2017-09-08 DIAGNOSIS — N12 Tubulo-interstitial nephritis, not specified as acute or chronic: Secondary | ICD-10-CM

## 2017-09-08 DIAGNOSIS — A419 Sepsis, unspecified organism: Secondary | ICD-10-CM | POA: Diagnosis present

## 2017-09-08 LAB — LACTIC ACID, PLASMA: LACTIC ACID, VENOUS: 1.7 mmol/L (ref 0.5–1.9)

## 2017-09-08 LAB — BLOOD CULTURE ID PANEL (REFLEXED)
ACINETOBACTER BAUMANNII: NOT DETECTED
CANDIDA ALBICANS: NOT DETECTED
CANDIDA GLABRATA: NOT DETECTED
CANDIDA PARAPSILOSIS: NOT DETECTED
CANDIDA TROPICALIS: NOT DETECTED
Candida krusei: NOT DETECTED
Carbapenem resistance: NOT DETECTED
ENTEROBACTER CLOACAE COMPLEX: NOT DETECTED
ESCHERICHIA COLI: DETECTED — AB
Enterobacteriaceae species: DETECTED — AB
Enterococcus species: NOT DETECTED
HAEMOPHILUS INFLUENZAE: NOT DETECTED
KLEBSIELLA PNEUMONIAE: NOT DETECTED
Klebsiella oxytoca: NOT DETECTED
Listeria monocytogenes: NOT DETECTED
Neisseria meningitidis: NOT DETECTED
PROTEUS SPECIES: NOT DETECTED
Pseudomonas aeruginosa: NOT DETECTED
Serratia marcescens: NOT DETECTED
Staphylococcus aureus (BCID): NOT DETECTED
Staphylococcus species: NOT DETECTED
Streptococcus agalactiae: NOT DETECTED
Streptococcus pneumoniae: NOT DETECTED
Streptococcus pyogenes: NOT DETECTED
Streptococcus species: NOT DETECTED

## 2017-09-08 LAB — BASIC METABOLIC PANEL
Anion gap: 12 (ref 5–15)
Anion gap: 13 (ref 5–15)
BUN: 9 mg/dL (ref 6–20)
BUN: 9 mg/dL (ref 6–20)
CALCIUM: 8.6 mg/dL — AB (ref 8.9–10.3)
CALCIUM: 9.3 mg/dL (ref 8.9–10.3)
CHLORIDE: 99 mmol/L — AB (ref 101–111)
CO2: 22 mmol/L (ref 22–32)
CO2: 26 mmol/L (ref 22–32)
CREATININE: 0.56 mg/dL (ref 0.44–1.00)
CREATININE: 0.59 mg/dL (ref 0.44–1.00)
Chloride: 93 mmol/L — ABNORMAL LOW (ref 101–111)
GFR calc Af Amer: >60 mL/min (ref >60)
GFR calc Af Amer: >60 mL/min (ref >60)
GFR calc non Af Amer: >60 mL/min (ref >60)
GLUCOSE: 266 mg/dL — AB (ref 65–99)
Glucose, Bld: 263 mg/dL — ABNORMAL HIGH (ref 65–99)
Potassium: 3.6 mmol/L (ref 3.5–5.1)
Potassium: 4.1 mmol/L (ref 3.5–5.1)
SODIUM: 134 mmol/L — AB (ref 135–145)
Sodium: 131 mmol/L — ABNORMAL LOW (ref 135–145)

## 2017-09-08 LAB — CBC WITH DIFFERENTIAL/PLATELET
BASOS ABS: 0 10*3/uL (ref 0–0.1)
BASOS PCT: 0 %
EOS ABS: 0 10*3/uL (ref 0–0.7)
EOS PCT: 0 %
HEMATOCRIT: 44.5 % (ref 35.0–47.0)
Hemoglobin: 15.8 g/dL (ref 12.0–16.0)
Lymphocytes Relative: 11 %
Lymphs Abs: 1.1 10*3/uL (ref 1.0–3.6)
MCH: 27.7 pg (ref 26.0–34.0)
MCHC: 35.4 g/dL (ref 32.0–36.0)
MCV: 78.2 fL — ABNORMAL LOW (ref 80.0–100.0)
MONO ABS: 1.2 10*3/uL — AB (ref 0.2–0.9)
MONOS PCT: 11 %
NEUTROS ABS: 8.4 10*3/uL — AB (ref 1.4–6.5)
Neutrophils Relative %: 78 %
PLATELETS: 215 10*3/uL (ref 150–440)
RBC: 5.69 MIL/uL — ABNORMAL HIGH (ref 3.80–5.20)
RDW: 13.9 % (ref 11.5–14.5)
WBC: 10.7 10*3/uL (ref 3.6–11.0)

## 2017-09-08 LAB — HEMOGLOBIN A1C
HEMOGLOBIN A1C: 9.6 % — AB (ref 4.8–5.6)
Mean Plasma Glucose: 228.82 mg/dL

## 2017-09-08 LAB — GLUCOSE, CAPILLARY
GLUCOSE-CAPILLARY: 239 mg/dL — AB (ref 65–99)
GLUCOSE-CAPILLARY: 278 mg/dL — AB (ref 65–99)

## 2017-09-08 LAB — CBC
HCT: 40.5 % (ref 35.0–47.0)
HEMOGLOBIN: 13.9 g/dL (ref 12.0–16.0)
MCH: 27.5 pg (ref 26.0–34.0)
MCHC: 34.5 g/dL (ref 32.0–36.0)
MCV: 79.8 fL — ABNORMAL LOW (ref 80.0–100.0)
Platelets: 187 10*3/uL (ref 150–440)
RBC: 5.07 MIL/uL (ref 3.80–5.20)
RDW: 13.9 % (ref 11.5–14.5)
WBC: 8.8 10*3/uL (ref 3.6–11.0)

## 2017-09-08 LAB — GLUCOSE, POCT (MANUAL RESULT ENTRY): POC GLUCOSE: 239 mg/dL — AB (ref 70–99)

## 2017-09-08 MED ORDER — PNEUMOCOCCAL VAC POLYVALENT 25 MCG/0.5ML IJ INJ: 0.5000 mL | INJECTION | INTRAMUSCULAR | Status: DC

## 2017-09-08 MED ORDER — DEXTROSE 5 % IV SOLN
2.0000 g | INTRAVENOUS | Status: DC
  Administered 2017-09-09: 02:00:00 2 g via INTRAVENOUS
  Filled 2017-09-08 (×2): qty 2

## 2017-09-08 MED ORDER — IBUPROFEN 600 MG PO TABS
ORAL_TABLET | ORAL | Status: AC
  Administered 2017-09-08: 600 mg
  Filled 2017-09-08: qty 1

## 2017-09-08 MED ORDER — SODIUM CHLORIDE 0.9 % IV BOLUS (SEPSIS)
1000.0000 mL | Freq: Once | INTRAVENOUS | Status: AC
  Administered 2017-09-08: 1000 mL via INTRAVENOUS

## 2017-09-08 MED ORDER — ASPIRIN EC 81 MG PO TBEC
81.0000 mg | DELAYED_RELEASE_TABLET | Freq: Every day | ORAL | Status: DC
  Administered 2017-09-08 – 2017-09-11 (×4): 81 mg via ORAL
  Filled 2017-09-08 (×4): qty 1

## 2017-09-08 MED ORDER — ACETAMINOPHEN 650 MG RE SUPP: 650.0000 mg | Freq: Four times a day (QID) | RECTAL | Status: DC | PRN

## 2017-09-08 MED ORDER — ASPIRIN EC 81 MG PO TBEC
DELAYED_RELEASE_TABLET | ORAL | Status: AC
  Administered 2017-09-08: 81 mg via ORAL
  Filled 2017-09-08: qty 1

## 2017-09-08 MED ORDER — METFORMIN HCL 500 MG PO TABS
500.0000 mg | ORAL_TABLET | Freq: Two times a day (BID) | ORAL | Status: DC
  Filled 2017-09-08: qty 1

## 2017-09-08 MED ORDER — INSULIN ASPART 100 UNIT/ML ~~LOC~~ SOLN
0.0000 [IU] | Freq: Three times a day (TID) | SUBCUTANEOUS | Status: DC
  Administered 2017-09-08: 17:00:00 3 [IU] via SUBCUTANEOUS
  Administered 2017-09-09: 17:00:00 2 [IU] via SUBCUTANEOUS
  Administered 2017-09-09: 5 [IU] via SUBCUTANEOUS
  Administered 2017-09-09: 1 [IU] via SUBCUTANEOUS
  Administered 2017-09-10 – 2017-09-11 (×5): 2 [IU] via SUBCUTANEOUS
  Filled 2017-09-08 (×9): qty 1

## 2017-09-08 MED ORDER — HEPARIN SODIUM (PORCINE) 5000 UNIT/ML IJ SOLN
5000.0000 [IU] | Freq: Three times a day (TID) | INTRAMUSCULAR | Status: DC
  Administered 2017-09-08 – 2017-09-11 (×8): 5000 [IU] via SUBCUTANEOUS
  Filled 2017-09-08 (×8): qty 1

## 2017-09-08 MED ORDER — CYCLOBENZAPRINE HCL 10 MG PO TABS
5.0000 mg | ORAL_TABLET | Freq: Three times a day (TID) | ORAL | Status: DC | PRN
  Administered 2017-09-09 – 2017-09-10 (×3): 5 mg via ORAL
  Filled 2017-09-08 (×3): qty 1

## 2017-09-08 MED ORDER — METOPROLOL TARTRATE 25 MG PO TABS
ORAL_TABLET | ORAL | Status: AC
  Administered 2017-09-08: 25 mg via ORAL
  Filled 2017-09-08: qty 1

## 2017-09-08 MED ORDER — ONDANSETRON HCL 4 MG PO TABS: 4.0000 mg | ORAL_TABLET | Freq: Four times a day (QID) | ORAL | Status: DC | PRN

## 2017-09-08 MED ORDER — SODIUM CHLORIDE 0.9 % IV BOLUS (SEPSIS)
500.0000 mL | Freq: Once | INTRAVENOUS | Status: AC
  Administered 2017-09-08: 500 mL via INTRAVENOUS

## 2017-09-08 MED ORDER — CEFTRIAXONE SODIUM 2 G IJ SOLR
2.0000 g | Freq: Once | INTRAMUSCULAR | Status: AC
  Administered 2017-09-08: 2 g via INTRAVENOUS
  Filled 2017-09-08: qty 2

## 2017-09-08 MED ORDER — METOPROLOL TARTRATE 25 MG PO TABS
25.0000 mg | ORAL_TABLET | Freq: Two times a day (BID) | ORAL | Status: DC
  Administered 2017-09-08 – 2017-09-11 (×7): 25 mg via ORAL
  Filled 2017-09-08 (×6): qty 1

## 2017-09-08 MED ORDER — ALBUTEROL SULFATE (2.5 MG/3ML) 0.083% IN NEBU: 3.0000 mL | INHALATION_SOLUTION | RESPIRATORY_TRACT | Status: DC | PRN

## 2017-09-08 MED ORDER — SODIUM CHLORIDE 0.9 % IV SOLN
INTRAVENOUS | Status: DC
  Administered 2017-09-08 – 2017-09-10 (×7): via INTRAVENOUS

## 2017-09-08 MED ORDER — TRAMADOL HCL 50 MG PO TABS
50.0000 mg | ORAL_TABLET | Freq: Four times a day (QID) | ORAL | Status: DC | PRN
  Administered 2017-09-08 – 2017-09-09 (×4): 50 mg via ORAL
  Filled 2017-09-08 (×4): qty 1

## 2017-09-08 MED ORDER — INSULIN ASPART 100 UNIT/ML ~~LOC~~ SOLN
0.0000 [IU] | Freq: Every day | SUBCUTANEOUS | Status: DC
Start: 2017-09-08 — End: 2017-09-11
  Administered 2017-09-08: 22:00:00 3 [IU] via SUBCUTANEOUS
  Administered 2017-09-09: 22:00:00 2 [IU] via SUBCUTANEOUS
  Filled 2017-09-08 (×2): qty 1

## 2017-09-08 MED ORDER — METFORMIN HCL 500 MG PO TABS
500.0000 mg | ORAL_TABLET | Freq: Once | ORAL | Status: AC
  Administered 2017-09-08: 500 mg via ORAL
  Filled 2017-09-08: qty 1

## 2017-09-08 MED ORDER — METFORMIN HCL 500 MG PO TABS
ORAL_TABLET | ORAL | Status: AC
  Filled 2017-09-08: qty 1

## 2017-09-08 MED ORDER — ACETAMINOPHEN 325 MG PO TABS
650.0000 mg | ORAL_TABLET | Freq: Four times a day (QID) | ORAL | Status: DC | PRN
  Administered 2017-09-08 – 2017-09-11 (×10): 650 mg via ORAL
  Filled 2017-09-08 (×10): qty 2

## 2017-09-08 MED ORDER — METHIMAZOLE 10 MG PO TABS
10.0000 mg | ORAL_TABLET | Freq: Two times a day (BID) | ORAL | Status: DC
  Administered 2017-09-08 – 2017-09-11 (×7): 10 mg via ORAL
  Filled 2017-09-08 (×7): qty 1

## 2017-09-08 MED ORDER — SENNOSIDES-DOCUSATE SODIUM 8.6-50 MG PO TABS: 1.0000 | ORAL_TABLET | Freq: Every evening | ORAL | Status: DC | PRN

## 2017-09-08 MED ORDER — ONDANSETRON HCL 4 MG/2ML IJ SOLN: 4.0000 mg | Freq: Four times a day (QID) | INTRAMUSCULAR | Status: DC | PRN

## 2017-09-08 MED ORDER — ACETAMINOPHEN 500 MG PO TABS
ORAL_TABLET | ORAL | Status: AC
  Administered 2017-09-08: 1000 mg
  Filled 2017-09-08: qty 2

## 2017-09-08 MED ORDER — DEXTROSE 5 % IV SOLN
2.0000 g | INTRAVENOUS | Status: DC
  Administered 2017-09-08: 2 g via INTRAVENOUS
  Filled 2017-09-08: qty 2

## 2017-09-08 MED ORDER — INFLUENZA VAC SPLIT QUAD 0.5 ML IM SUSY: 0.5000 mL | PREFILLED_SYRINGE | INTRAMUSCULAR | Status: DC

## 2017-09-08 NOTE — ED Notes (Signed)
Pt transported to room 116. 

## 2017-09-08 NOTE — ED Notes (Signed)
Patient is resting comfortably. Pt given wet rag to place on forehead and back of the neck

## 2017-09-08 NOTE — Progress Notes (Signed)
Pt admitted from ED accompanied by her fiance. Reports feels hot and perspiring with HA improved from 10/10 and left flank pain from 8/10 to 6/10 with tylenol/tramadol given in ED.  States she just wants to rest now. IVF's continued. Low grade fever.
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
## 2017-09-08 NOTE — Progress Notes (Signed)
63- Dr. Bridgett Larsson notified of pt continued pain level and current medication orders and times of administration- no new orders.

## 2017-09-08 NOTE — ED Notes (Signed)
Patient is resting comfortably. 

## 2017-09-08 NOTE — ED Notes (Signed)
Family at bedside. 

## 2017-09-08 NOTE — ED Notes (Signed)
Temp now 100.8, Pt is up to the bedside commode. Pt is sweating and is given a gown to change her clothes. Pt given wet rags to cover her face and neck.

## 2017-09-08 NOTE — ED Notes (Signed)
Pt awaiting bed placement.  Pt alert and oriented, skin warm and dry.  No fever noted at this time. See VS.  Pt Iv in place no redness, or infiltration noted, SL at this time.  Pt NSR on monitor.  Changed bed linens for patient comfort.  Will continue to monitor.

## 2017-09-08 NOTE — ED Notes (Signed)
Pt given tylenol for headache and fever. Pt given a sheet to cover with and a cool rag for head. Pt is ambulatory to the bed side commode.

## 2017-09-08 NOTE — ED Notes (Signed)
Pt continues to wait for bed assignment.  Resting in bed.  No sweating noted at this time, VSS.  NSR on monitor.
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
## 2017-09-08 NOTE — Progress Notes (Signed)
Pharmacy Antibiotic Note  Kristin Wilson is a 56 y.o. female admitted on 09/08/2017 with UTI.  Pharmacy has been consulted for ceftriaxone dosing.  Plan: Will start patient on ceftriaxone 2g IV daily for 5 days.     Temp (24hrs), Avg:100.8 F (38.2 C), Min:100 F (37.8 C), Max:101.5 F (38.6 C)   Recent Labs Lab 09/08/17 0016 09/08/17 0059  WBC  --  10.7  CREATININE 0.56  --   LATICACIDVEN 1.7  --     CrCl cannot be calculated (Unknown ideal weight.).    Allergies  Allergen Reactions  . Percocet [Oxycodone-Acetaminophen] Itching    Thank you for allowing pharmacy to be a part of this patient's care.  Tobie Lords, PharmD, BCPS Clinical Pharmacist 09/08/2017

## 2017-09-08 NOTE — H&P (Signed)
Kristin Wilson at [place] NAME: Kristin Wilson    MR#:  [id number]  DATE OF BIRTH:  03/31/1961  DATE OF ADMISSION:  09/08/2017  PRIMARY CARE PHYSICIAN: Inge Rise, NP   REQUESTING/REFERRING PHYSICIAN:   CHIEF COMPLAINT:   Chief Complaint  Patient presents with  . Back Pain  . Dysuria    HISTORY OF PRESENT ILLNESS: Kristin Wilson  is a 56 y.o. female with a known history of hypertension, diabetes mellitus type 2, osteoarthritis presented to the emergency room with left flank pain since last 2 days.Patient also has fever and increased urinary frequency. Complaints of dysuria.patient was evaluated in he emergency room was found to have urinary tract infection and sepsis. Patient was started on IV fluids based on sepsis protocol and was given IV antibiotics.no complaints of any chest pain, shortness of breath. Hospitalist service was consulted for further care.  PAST MEDICAL HISTORY:   Past Medical History:  Diagnosis Date  . Diabetes mellitus without complication ([place])   . Hypertension   . Hyperthyroidism   . Osteoarthritis   . Sciatica     PAST SURGICAL HISTORY: Past Surgical History:  Procedure Laterality Date  . cholecystecomy    . CHOLECYSTECTOMY    . TUBAL LIGATION      SOCIAL HISTORY:  Social History  Substance Use Topics  . Smoking status: Never Smoker  . Smokeless tobacco: Never Used  . Alcohol use No    FAMILY HISTORY:  Family History  Problem Relation Age of Onset  . Heart failure Mother   . Diabetic kidney disease Paternal Grandmother   . CAD Paternal Grandmother     DRUG ALLERGIES:  Allergies  Allergen Reactions  . Percocet [Oxycodone-Acetaminophen] Itching    REVIEW OF SYSTEMS:   CONSTITUTIONAL: Has fever,no fatigue or weakness.  EYES: No blurred or double vision.  EARS, NOSE, AND THROAT: No tinnitus or ear pain.  RESPIRATORY: No cough, shortness of breath, wheezing or hemoptysis.   CARDIOVASCULAR: No chest pain, orthopnea, edema.  GASTROINTESTINAL: No nausea, vomiting, diarrhea or abdominal pain.  Left flank pain present GENITOURINARY: Has dysuria, no hematuria.  ENDOCRINE: No polyuria, nocturia,  HEMATOLOGY: No anemia, easy bruising or bleeding SKIN: No rash or lesion. MUSCULOSKELETAL: No joint pain or arthritis.   NEUROLOGIC: No tingling, numbness, weakness.  PSYCHIATRY: No anxiety or depression.   MEDICATIONS AT HOME:  Prior to Admission medications   Medication Sig Start Date End Date Taking? Authorizing Provider  albuterol (PROVENTIL HFA;VENTOLIN HFA) 108 (90 Base) MCG/ACT inhaler Inhale 2 puffs into the lungs every 4 (four) hours as needed for wheezing or shortness of breath. 03/26/16   Paulette Blanch, MD  aspirin EC 81 MG EC tablet Take 1 tablet (81 mg total) by mouth daily. 10/08/15   [name], [name], MD  butalbital-acetaminophen-caffeine (FIORICET, ESGIC) [phone] MG tablet Take 1-2 tablets by mouth every 6 (six) hours as needed for headache. 02/04/17 02/04/18  Loney Hering, MD  chlorpheniramine-HYDROcodone [facility] PENNKINETIC ER) 10-8 MG/5ML SUER Take 5 mLs by mouth 2 (two) times daily. 03/26/16   Paulette Blanch, MD  cyclobenzaprine (FLEXERIL) 5 MG tablet Take 1 tablet (5 mg total) by mouth 3 (three) times daily as needed for muscle spasms. 08/23/17   [name], [name], PA-C  diclofenac (VOLTAREN) 50 MG EC tablet Take 1 tablet (50 mg total) by mouth 2 (two) times daily. 06/06/17   [name], [name], PA-C  meclizine (ANTIVERT) 12.5 MG tablet  Take 1 tablet (12.5 mg total) by mouth 3 (three) times daily as needed for dizziness. 08/23/17   [name], [name], PA-C  meloxicam (MOBIC) 15 MG tablet Take 1 tablet (15 mg total) by mouth daily. 08/20/17   [name], [name], PA-C  methimazole (TAPAZOLE) 10 MG tablet Take 1 tablet (10 mg total) by mouth 2 (two) times daily. 10/08/15   [name], [name], MD  methocarbamol (ROBAXIN) 500 MG tablet  Take 1 tablet (500 mg total) by mouth 4 (four) times daily. 08/20/17   [name], [name], PA-C  metoprolol tartrate (LOPRESSOR) 25 MG tablet Take 1 tablet (25 mg total) by mouth 2 (two) times daily. 10/08/15   [name], [name], MD  phenazopyridine (PYRIDIUM) 100 MG tablet Take 1 tablet (100 mg total) by mouth 3 (three) times daily as needed for pain. 10/22/16 10/22/17  [name], PA-C  predniSONE (STERAPRED UNI-PAK 21 TAB) 10 MG (21) TBPK tablet 6-day taper as directed. 08/23/17   [name], [name], PA-C  sulfamethoxazole-trimethoprim (BACTRIM DS,SEPTRA DS) 800-160 MG tablet Take 1 tablet by mouth 2 (two) times daily. 10/22/16   [name], PA-C  traMADol (ULTRAM) 50 MG tablet Take 1 tablet (50 mg total) by mouth every 6 (six) hours as needed. 01/19/16   Loney Hering, MD      PHYSICAL EXAMINATION:   VITAL SIGNS: Blood pressure (!) 145/66, pulse (!) 107, temperature 100 F (37.8 C), temperature source Oral, resp. rate (!) 24, last menstrual period 10/22/2016, SpO2 96 %.  GENERAL:  56 y.o.-year-old patient lying in the bed with no acute distress.  EYES: Pupils equal, round, reactive to light and accommodation. No scleral icterus. Extraocular muscles intact.  HEENT: Head atraumatic, normocephalic. Oropharynx and nasopharynx clear.  NECK:  Supple, no jugular venous distention. No thyroid enlargement, no tenderness.  LUNGS: Normal breath sounds bilaterally, no wheezing, rales,rhonchi or crepitation. No use of accessory muscles of respiration.  CARDIOVASCULAR: S1, S2 normal. No murmurs, rubs, or gallops.  ABDOMEN: Soft, nontender, nondistended. Bowel sounds present. No organomegaly or mass.  Left CVA tenderness noted. EXTREMITIES: No pedal edema, cyanosis, or clubbing.  NEUROLOGIC: Cranial nerves II through XII are intact. Muscle strength 5/5 in all extremities. Sensation intact. Gait not checked.  PSYCHIATRIC: The patient is alert and oriented x 3.  SKIN: No obvious  rash, lesion, or ulcer.   LABORATORY PANEL:   CBC  Recent Labs Lab 09/08/17 0059  WBC 10.7  HGB 15.8  HCT 44.5  PLT 215  MCV 78.2*  MCH 27.7  MCHC 35.4  RDW 13.9  LYMPHSABS 1.1  MONOABS 1.2*  EOSABS 0.0  BASOSABS 0.0   ------------------------------------------------------------------------------------------------------------------  Chemistries   Recent Labs Lab 09/08/17 0016  NA 131*  K 4.1  CL 93*  CO2 26  GLUCOSE 266*  BUN 9  CREATININE 0.56  CALCIUM 9.3   ------------------------------------------------------------------------------------------------------------------ CrCl cannot be calculated (Unknown ideal weight.). ------------------------------------------------------------------------------------------------------------------ No results for input(s): TSH, T4TOTAL, T3FREE, THYROIDAB in the last 72 hours.  Invalid input(s): FREET3   Coagulation profile No results for input(s): INR, PROTIME in the last 168 hours. ------------------------------------------------------------------------------------------------------------------- No results for input(s): DDIMER in the last 72 hours. -------------------------------------------------------------------------------------------------------------------  Cardiac Enzymes No results for input(s): CKMB, TROPONINI, MYOGLOBIN in the last 168 hours.  Invalid input(s): CK ------------------------------------------------------------------------------------------------------------------ Invalid input(s): POCBNP  ---------------------------------------------------------------------------------------------------------------  Urinalysis    Component Value Date/Time   COLORURINE AMBER (A) 09/07/2017 2121   APPEARANCEUR CLOUDY (A) 09/07/2017 2121   APPEARANCEUR Clear 08/25/2014 2328  LABSPEC 1.015 09/07/2017 2121   LABSPEC 1.014 08/25/2014 2328   PHURINE 5.0 09/07/2017 2121   GLUCOSEU >=500 (A) 09/07/2017 2121    GLUCOSEU 50 mg/dL 08/25/2014 2328   HGBUR MODERATE (A) 09/07/2017 2121   BILIRUBINUR NEGATIVE 09/07/2017 2121   BILIRUBINUR Negative 08/25/2014 2328   KETONESUR 80 (A) 09/07/2017 2121   PROTEINUR 100 (A) 09/07/2017 2121   NITRITE POSITIVE (A) 09/07/2017 2121   LEUKOCYTESUR LARGE (A) 09/07/2017 2121   LEUKOCYTESUR 2+ 08/25/2014 2328     RADIOLOGY: No results found.  EKG: Orders placed or performed during the hospital encounter of 09/08/17  . ED EKG 12-Lead  . ED EKG 12-Lead  . EKG 12-Lead  . EKG 12-Lead    IMPRESSION AND PLAN: 56 year old female patient with history of hypertension, diabetes mellitus presented to the emergency room with left flank pain and fever.  Admitting diagnosis 1. Acute pyelonephritis 2. Sepsis 3.Hyponatremia 4. Type 2 diabetes mellitus Treatment plan Admit patient to medical floor IV fluid hydration Start patient on IV Rocephin antibiotic Pain management with IV Toradol Follow-up electrolytes All the records are reviewed and case discussed with ED provider. Management plans discussed with the patient, family and they are in agreement.  CODE STATUS:FULL CODE Code Status History    Date Active Date Inactive Code Status Order ID Comments User Context   10/08/2015  4:43 AM 10/08/2015  7:21 PM Full Code [id number]  Rise Patience, MD Inpatient       TOTAL TIME TAKING CARE OF THIS PATIENT: 50 minutes.    Saundra Shelling M.D on 09/08/2017 at 3:23 AM  Between 7am to 6pm - Pager - [phone]  After 6pm go to [email or web address] - password [email or web address] [facility]  Office  [phone]  CC: Primary care physician; Inge Rise, NP

## 2017-09-08 NOTE — ED Provider Notes (Signed)
[facility] Emergency Department Provider Note   First MD Initiated Contact with Patient 09/08/17 0050     (approximate)  I have reviewed the triage vital signs and the nursing notes.   HISTORY  Chief Complaint Back Pain and Dysuria   HPI Kristin Wilson is a 56 y.o. female with below list of chronic medical conditions presents to the emergency department with urinary urgency, frequency and 7 out of 10 left back pain 4 days. Patient with some nausea no vomiting. Patient denies any diarrhea or constipation. Patient admits to fever at home febrile on arrival to the emergency department.   Past Medical History:  Diagnosis Date  . Diabetes mellitus without complication ([place])   . Hypertension   . Hyperthyroidism   . Osteoarthritis   . Sciatica     Patient Active Problem List   Diagnosis Date Noted  . Sepsis ([place]) 09/08/2017  . Chest discomfort 10/08/2015  . DOE (dyspnea on exertion) 10/08/2015  . Hyperglycemia 10/08/2015    Past Surgical History:  Procedure Laterality Date  . cholecystecomy    . CHOLECYSTECTOMY    . TUBAL LIGATION      Prior to Admission medications   Medication Sig Start Date End Date Taking? Authorizing Provider  albuterol (PROVENTIL HFA;VENTOLIN HFA) 108 (90 Base) MCG/ACT inhaler Inhale 2 puffs into the lungs every 4 (four) hours as needed for wheezing or shortness of breath. 03/26/16   Paulette Blanch, MD  aspirin EC 81 MG EC tablet Take 1 tablet (81 mg total) by mouth daily. 10/08/15   [name], [name], MD  butalbital-acetaminophen-caffeine (FIORICET, ESGIC) [phone] MG tablet Take 1-2 tablets by mouth every 6 (six) hours as needed for headache. 02/04/17 02/04/18  Loney Hering, MD  chlorpheniramine-HYDROcodone [facility] PENNKINETIC ER) 10-8 MG/5ML SUER Take 5 mLs by mouth 2 (two) times daily. 03/26/16   Paulette Blanch, MD  cyclobenzaprine (FLEXERIL) 5 MG tablet Take 1 tablet (5 mg total) by mouth 3 (three) times daily as  needed for muscle spasms. 08/23/17   [name], [name], PA-C  diclofenac (VOLTAREN) 50 MG EC tablet Take 1 tablet (50 mg total) by mouth 2 (two) times daily. 06/06/17   [name], [name], PA-C  meclizine (ANTIVERT) 12.5 MG tablet Take 1 tablet (12.5 mg total) by mouth 3 (three) times daily as needed for dizziness. 08/23/17   [name], [name], PA-C  meloxicam (MOBIC) 15 MG tablet Take 1 tablet (15 mg total) by mouth daily. 08/20/17   [name], [name], PA-C  methimazole (TAPAZOLE) 10 MG tablet Take 1 tablet (10 mg total) by mouth 2 (two) times daily. 10/08/15   [name], [name], MD  methocarbamol (ROBAXIN) 500 MG tablet Take 1 tablet (500 mg total) by mouth 4 (four) times daily. 08/20/17   [name], [name], PA-C  metoprolol tartrate (LOPRESSOR) 25 MG tablet Take 1 tablet (25 mg total) by mouth 2 (two) times daily. 10/08/15   [name], [name], MD  phenazopyridine (PYRIDIUM) 100 MG tablet Take 1 tablet (100 mg total) by mouth 3 (three) times daily as needed for pain. 10/22/16 10/22/17  [name], PA-C  predniSONE (STERAPRED UNI-PAK 21 TAB) 10 MG (21) TBPK tablet 6-day taper as directed. 08/23/17   [name], [name], PA-C  sulfamethoxazole-trimethoprim (BACTRIM DS,SEPTRA DS) 800-160 MG tablet Take 1 tablet by mouth 2 (two) times daily. 10/22/16   [name], PA-C  traMADol (ULTRAM) 50 MG tablet Take 1 tablet (50 mg  total) by mouth every 6 (six) hours as needed. 01/19/16   Loney Hering, MD    Allergies Percocet [oxycodone-acetaminophen]  Family History  Problem Relation Age of Onset  . Heart failure Mother   . Diabetic kidney disease Paternal Grandmother   . CAD Paternal Grandmother     Social History Social History  Substance Use Topics  . Smoking status: Never Smoker  . Smokeless tobacco: Never Used  . Alcohol use No    Review of Systems Constitutional: positive for fever/chills Eyes: No visual changes. ENT: No sore  throat. Cardiovascular: Denies chest pain. Respiratory: Denies shortness of breath. Gastrointestinal: No abdominal pain.  No nausea, no vomiting.  No diarrhea.  No constipation.positive for left flank pain Genitourinary: Negative for dysuria.positive for urinary frequency and urgency Musculoskeletal: Negative for neck pain.  Negative for back pain. Integumentary: Negative for rash. Neurological: Negative for headaches, focal weakness or numbness.   ____________________________________________   PHYSICAL EXAM:  VITAL SIGNS: ED Triage Vitals  Enc Vitals Group     BP 09/07/17 2116 (!) 164/88     Pulse Rate 09/07/17 2116 (!) 106     Resp 09/07/17 2116 18     Temp 09/07/17 2116 100 F (37.8 C)     Temp Source 09/07/17 2116 Oral     SpO2 09/07/17 2116 97 %     Weight --      Height --      Head Circumference --      Peak Flow --      Pain Score 09/07/17 2115 7     Pain Loc --      Pain Edu? --      Excl. in [place]? --     Constitutional: Alert and oriented. Well appearing and in no acute distress. Eyes: Conjunctivae are normal.  Head: Atraumatic. Mouth/Throat: Mucous membranes are moist.  Oropharynx non-erythematous. Neck: No stridor.   Cardiovascular:tachycardia, regular rhythm. Good peripheral circulation. Grossly normal heart sounds. Respiratory: Normal respiratory effort.  No retractions. Lungs CTAB. Gastrointestinal: Soft and nontender. No distention. positive for left CVA tenderness Musculoskeletal: No lower extremity tenderness nor edema. No gross deformities of extremities. Neurologic:  Normal speech and language. No gross focal neurologic deficits are appreciated.  Skin:  Skin is warm, dry and intact. No rash noted. Psychiatric: Mood and affect are normal. Speech and behavior are normal.  ____________________________________________   LABS (all labs ordered are listed, but only abnormal results are displayed)  Labs Reviewed  URINALYSIS, COMPLETE (UACMP) WITH  MICROSCOPIC - Abnormal; Notable for the following:       Result Value   Color, Urine AMBER (*)    APPearance CLOUDY (*)    Glucose, UA >=500 (*)    Hgb urine dipstick MODERATE (*)    Ketones, ur 80 (*)    Protein, ur 100 (*)    Nitrite POSITIVE (*)    Leukocytes, UA LARGE (*)    Bacteria, UA MANY (*)    Squamous Epithelial / LPF 0-5 (*)    All other components within normal limits  BASIC METABOLIC PANEL - Abnormal; Notable for the following:    Sodium 131 (*)    Chloride 93 (*)    Glucose, Bld 266 (*)    All other components within normal limits  CBC WITH DIFFERENTIAL/PLATELET - Abnormal; Notable for the following:    RBC 5.69 (*)    MCV 78.2 (*)    Neutro Abs 8.4 (*)    Monocytes Absolute 1.2 (*)  All other components within normal limits  CULTURE, BLOOD (ROUTINE X 2)  CULTURE, BLOOD (ROUTINE X 2)  URINE CULTURE  LACTIC ACID, PLASMA  CBC WITH DIFFERENTIAL/PLATELET   ____________________________________________  EKG  ED ECG REPORT I, [place] N Rachelle Edwards, the attending physician, personally viewed and interpreted this ECG.   Date: 09/08/2017  EKG Time: 2:09 AM  Rate: 105  Rhythm: sinus tachycardia  Axis: normal  Intervals:normal  ST&T Change: none    PROCEDURES  Critical Care performed: CRITICAL CARE Performed by: Gregor Hams   Total critical care time: 30 minutes  Critical care time was exclusive of separately billable procedures and treating other patients.  Critical care was necessary to treat or prevent imminent or life-threatening deterioration.  Critical care was time spent personally by me on the following activities: development of treatment plan with patient and/or surrogate as well as nursing, discussions with consultants, evaluation of patient's response to treatment, examination of patient, obtaining history from patient or surrogate, ordering and performing treatments and interventions, ordering and review of laboratory studies, ordering and  review of radiographic studies, pulse oximetry and re-evaluation of patient's condition.  Procedures   ____________________________________________   INITIAL IMPRESSION / ASSESSMENT AND PLAN / ED COURSE  As part of my medical decision making, I reviewed the following data within the electronic MEDICAL RECORD NUMBER 56 year old female presenting to the emergency department history and physical exam consistent with possible sepsis secondary to pyelonephritis. Patient to be tachycardic and febrile.laboratory data consistent with urinary tract infection. sepsis protocol initiated patient given IV normal saline, appropriate IV antibiotics. Patient also given Tylenol ibuprofen. Patient discussed with Dr.huglemeyer for hospital admission further evaluation and management.        ____________________________________________  FINAL CLINICAL IMPRESSION(S) / ED DIAGNOSES  Final diagnoses:  Sepsis, due to unspecified organism [facility])  Pyelonephritis     MEDICATIONS GIVEN DURING THIS VISIT:  Medications  sodium chloride 0.9 % bolus 1,000 mL (0 mLs Intravenous Stopped 09/08/17 0230)    And  sodium chloride 0.9 % bolus 1,000 mL (0 mLs Intravenous Stopped 09/08/17 0356)    And  sodium chloride 0.9 % bolus 500 mL (500 mLs Intravenous New Bag/Given 09/08/17 0357)  cefTRIAXone (ROCEPHIN) 2 g in dextrose 5 % 50 mL IVPB (0 g Intravenous Stopped 09/08/17 0236)  acetaminophen (TYLENOL) 500 MG tablet (1,000 mg  Given 09/08/17 0356)     NEW OUTPATIENT MEDICATIONS STARTED DURING THIS VISIT:  New Prescriptions   No medications on file    Modified Medications   No medications on file    Discontinued Medications   No medications on file     Note:  This document was prepared using Dragon voice recognition software and may include unintentional dictation errors.    Gregor Hams, MD 09/08/17 [phone]

## 2017-09-09 LAB — GLUCOSE, CAPILLARY
GLUCOSE-CAPILLARY: 239 mg/dL — AB (ref 65–99)
GLUCOSE-CAPILLARY: 162 mg/dL — AB (ref 65–99)
GLUCOSE-CAPILLARY: 211 mg/dL — AB (ref 65–99)

## 2017-09-09 LAB — GLUCOSE, POCT (MANUAL RESULT ENTRY)
POC Glucose: 144 mg/dl — AB (ref 70–99)
POC Glucose: 275 mg/dl — AB (ref 70–99)

## 2017-09-09 MED ORDER — MORPHINE SULFATE (PF) 2 MG/ML IV SOLN
1.0000 mg | INTRAVENOUS | Status: DC | PRN
  Administered 2017-09-09: 1 mg via INTRAVENOUS
  Filled 2017-09-09: qty 1

## 2017-09-09 MED ORDER — SODIUM CHLORIDE 0.9 % IV SOLN
1.0000 g | Freq: Three times a day (TID) | INTRAVENOUS | Status: DC
  Administered 2017-09-09 – 2017-09-10 (×3): 1 g via INTRAVENOUS
  Filled 2017-09-09 (×5): qty 1

## 2017-09-09 MED ORDER — INFLUENZA VAC SPLIT QUAD 0.5 ML IM SUSY: 0.5000 mL | PREFILLED_SYRINGE | INTRAMUSCULAR | Status: DC

## 2017-09-09 MED ORDER — INSULIN ASPART 100 UNIT/ML ~~LOC~~ SOLN
4.0000 [IU] | Freq: Three times a day (TID) | SUBCUTANEOUS | Status: DC
  Administered 2017-09-09 – 2017-09-11 (×9): 4 [IU] via SUBCUTANEOUS
  Filled 2017-09-09 (×8): qty 1

## 2017-09-09 MED ORDER — PNEUMOCOCCAL VAC POLYVALENT 25 MCG/0.5ML IJ INJ: 0.5000 mL | INJECTION | INTRAMUSCULAR | Status: DC

## 2017-09-09 MED ORDER — INSULIN GLARGINE 100 UNIT/ML ~~LOC~~ SOLN
15.0000 [IU] | Freq: Every day | SUBCUTANEOUS | Status: DC
  Administered 2017-09-09 – 2017-09-11 (×3): 15 [IU] via SUBCUTANEOUS
  Filled 2017-09-09 (×4): qty 0.15

## 2017-09-09 MED ORDER — DIPHENHYDRAMINE HCL 25 MG PO CAPS: 25.0000 mg | ORAL_CAPSULE | Freq: Four times a day (QID) | ORAL | Status: DC | PRN
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
## 2017-09-09 NOTE — Progress Notes (Addendum)
Kristin Wilson at [place] NAME: Kristin Wilson    MR#:  [id number]  DATE OF BIRTH:  1961/04/06  SUBJECTIVE:  CHIEF COMPLAINT:   Chief Complaint  Patient presents with  . Back Pain  . Dysuria     Fever 103 and chills this morning. No appetite. REVIEW OF SYSTEMS:  Review of Systems  Constitutional: Positive for malaise/fatigue. Negative for chills and fever.  HENT: Negative for sore throat.   Eyes: Negative for blurred vision and double vision.  Respiratory: Negative for cough, hemoptysis, shortness of breath, wheezing and stridor.   Cardiovascular: Negative for chest pain, palpitations, orthopnea and leg swelling.  Gastrointestinal: Negative for abdominal pain, blood in stool, diarrhea, melena, nausea and vomiting.  Genitourinary: Positive for flank pain. Negative for dysuria and hematuria.  Musculoskeletal: Negative for back pain and joint pain.  Skin: Negative for rash.  Neurological: Negative for dizziness, sensory change, focal weakness, seizures, loss of consciousness, weakness and headaches.  Endo/Heme/Allergies: Negative for polydipsia.  Psychiatric/Behavioral: Negative for depression. The patient is not nervous/anxious.     DRUG ALLERGIES:   Allergies  Allergen Reactions  . Percocet [Oxycodone-Acetaminophen] Itching   VITALS:  Blood pressure 133/65, pulse 100, temperature (!) 100.5 F (38.1 C), temperature source Oral, resp. rate 20, height 5\' 2"  (1.575 m), weight 183 lb (83 kg), last menstrual period 10/22/2016, SpO2 96 %. PHYSICAL EXAMINATION:  Physical Exam  Constitutional: She is oriented to person, place, and time and well-developed, well-nourished, and in no distress.  HENT:  Head: Normocephalic.  Mouth/Throat: Oropharynx is clear and moist.  Eyes: Pupils are equal, round, and reactive to light. Conjunctivae and EOM are normal. No scleral icterus.  Neck: Normal range of motion. Neck supple. No JVD present. No  tracheal deviation present.  Cardiovascular: Normal rate, regular rhythm and normal heart sounds.  Exam reveals no gallop.   No murmur heard. Pulmonary/Chest: Effort normal and breath sounds normal. No respiratory distress. She has no wheezes. She has no rales.  Abdominal: Soft. Bowel sounds are normal. She exhibits no distension. There is no tenderness. There is no rebound.  Musculoskeletal: Normal range of motion. She exhibits no edema or tenderness.  Neurological: She is alert and oriented to person, place, and time. No cranial nerve deficit.  Skin: No rash noted. No erythema.  Psychiatric: Affect normal.   LABORATORY PANEL:  Female CBC  Recent Labs Lab 09/08/17 1137  WBC 8.8  HGB 13.9  HCT 40.5  PLT 187   ------------------------------------------------------------------------------------------------------------------ Chemistries   Recent Labs Lab 09/08/17 1137  NA 134*  K 3.6  CL 99*  CO2 22  GLUCOSE 263*  BUN 9  CREATININE 0.59  CALCIUM 8.6*   RADIOLOGY:  No results found. ASSESSMENT AND PLAN:   56 year old female patient with history of hypertension, diabetes mellitus presented to the emergency room with left flank pain and fever.  1. Sepsis due to acute pyelonephritis and Escherichia coli bacteremia Discontinued IV Rocephin, changed to meropenem and follow-up urine culture and blood culture sensitivity. ID consult. Pain control. 2.Hyponatremia. Improved. 3. Type 2 diabetes mellitus Start Lantus, continue sliding scale. 4. Hypertension. Continue Lopressor.  All the records are reviewed and case discussed with Care Management/Social Worker. Management plans discussed with the patient, family and they are in agreement.  CODE STATUS: Full Code  TOTAL TIME TAKING CARE OF THIS PATIENT: 37 minutes.   More than 50% of the time was spent in counseling/coordination of care: YES  POSSIBLE D/C  IN 2 DAYS, DEPENDING ON CLINICAL CONDITION.   Demetrios Loll M.D on  09/09/2017 at 12:30 PM  Between 7am to 6pm - Pager - [phone]  After 6pm go to [email or web address] - [facility]

## 2017-09-09 NOTE — Plan of Care (Signed)
Problem: Physical Regulation: Goal: Ability to maintain clinical measurements within normal limits will improve Outcome: Not Progressing Pt continues to have elevated temperature. Tylenol given with some improvement noted.

## 2017-09-10 ENCOUNTER — Inpatient Hospital Stay: Payer: Self-pay

## 2017-09-10 LAB — URINE CULTURE: Culture: >=100000 — AB

## 2017-09-10 LAB — CULTURE, BLOOD (ROUTINE X 2): SPECIAL REQUESTS: ADEQUATE

## 2017-09-10 LAB — CBC
HCT: 34.9 % — ABNORMAL LOW (ref 35.0–47.0)
Hemoglobin: 12.3 g/dL (ref 12.0–16.0)
MCH: 27.8 pg (ref 26.0–34.0)
MCHC: 35.3 g/dL (ref 32.0–36.0)
MCV: 78.7 fL — AB (ref 80.0–100.0)
PLATELETS: 206 10*3/uL (ref 150–440)
RBC: 4.44 MIL/uL (ref 3.80–5.20)
RDW: 13.9 % (ref 11.5–14.5)
WBC: 5.3 10*3/uL (ref 3.6–11.0)

## 2017-09-10 LAB — BASIC METABOLIC PANEL
Anion gap: 10 (ref 5–15)
BUN: 6 mg/dL (ref 6–20)
CO2: 24 mmol/L (ref 22–32)
Calcium: 8.1 mg/dL — ABNORMAL LOW (ref 8.9–10.3)
Chloride: 101 mmol/L (ref 101–111)
Creatinine, Ser: 0.47 mg/dL (ref 0.44–1.00)
GLUCOSE: 166 mg/dL — AB (ref 65–99)
POTASSIUM: 2.9 mmol/L — AB (ref 3.5–5.1)
Sodium: 135 mmol/L (ref 135–145)

## 2017-09-10 LAB — GLUCOSE, CAPILLARY
GLUCOSE-CAPILLARY: 159 mg/dL — AB (ref 65–99)
GLUCOSE-CAPILLARY: 158 mg/dL — AB (ref 65–99)
GLUCOSE-CAPILLARY: 152 mg/dL — AB (ref 65–99)
GLUCOSE-CAPILLARY: 180 mg/dL — AB (ref 65–99)

## 2017-09-10 LAB — HIV ANTIBODY (ROUTINE TESTING W REFLEX): HIV SCREEN 4TH GENERATION: NONREACTIVE

## 2017-09-10 MED ORDER — CEFAZOLIN SODIUM-DEXTROSE 1-4 GM/50ML-% IV SOLN
1.0000 g | Freq: Three times a day (TID) | INTRAVENOUS | Status: DC
  Administered 2017-09-10 – 2017-09-11 (×3): 1 g via INTRAVENOUS
  Filled 2017-09-10 (×4): qty 50

## 2017-09-10 MED ORDER — POTASSIUM CHLORIDE CRYS ER 20 MEQ PO TBCR
40.0000 meq | EXTENDED_RELEASE_TABLET | ORAL | Status: AC
  Administered 2017-09-10 (×2): 40 meq via ORAL
  Filled 2017-09-10 (×2): qty 2
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
## 2017-09-10 NOTE — Progress Notes (Signed)
[facility] NAME: Levie Owensby    MR#:  [id number]  DATE OF BIRTH:  [birth year]  SUBJECTIVE:   Having chills. Had low grade fever today REVIEW OF SYSTEMS:   Review of Systems  Constitutional: Positive for chills and fever. Negative for weight loss.  HENT: Negative for ear discharge, ear pain and nosebleeds.   Eyes: Negative for blurred vision, pain and discharge.  Respiratory: Negative for sputum production, shortness of breath, wheezing and stridor.   Cardiovascular: Negative for chest pain, palpitations, orthopnea and PND.  Gastrointestinal: Negative for abdominal pain, diarrhea, nausea and vomiting.  Genitourinary: Negative for frequency and urgency.  Musculoskeletal: Negative for back pain and joint pain.  Neurological: Positive for weakness. Negative for sensory change, speech change and focal weakness.  Psychiatric/Behavioral: Negative for depression and hallucinations. The patient is not nervous/anxious.    Tolerating Diet:yes Tolerating PT: not needed  DRUG ALLERGIES:   Allergies  Allergen Reactions  . Percocet [Oxycodone-Acetaminophen] Itching    VITALS:  Blood pressure 133/66, pulse 98, temperature 98.6 F (37 C), temperature source Oral, resp. rate 18, height 5\' 2"  (1.575 m), weight 83 kg (183 lb), last menstrual period 06/21/2017, SpO2 97 %.  PHYSICAL EXAMINATION:   Physical Exam  GENERAL:  56 y.o.-year-old patient lying in the bed with no acute distress. obese EYES: Pupils equal, round, reactive to light and accommodation. No scleral icterus. Extraocular muscles intact.  HEENT: Head atraumatic, normocephalic. Oropharynx and nasopharynx clear.  NECK:  Supple, no jugular venous distention. No thyroid enlargement, no tenderness.  LUNGS: Normal breath sounds bilaterally, no wheezing, rales, rhonchi. No use of accessory muscles of respiration.  CARDIOVASCULAR: S1, S2 normal. No murmurs, rubs, or  gallops.  ABDOMEN: Soft, nontender, nondistended. Bowel sounds present. No organomegaly or mass.  EXTREMITIES: No cyanosis, clubbing or edema b/l.    NEUROLOGIC: Cranial nerves II through XII are intact. No focal Motor or sensory deficits b/l.   PSYCHIATRIC:  patient is alert and oriented x 3.  SKIN: No obvious rash, lesion, or ulcer.   LABORATORY PANEL:  CBC  Recent Labs Lab 09/10/17 0517  WBC 5.3  HGB 12.3  HCT 34.9*  PLT 206    Chemistries   Recent Labs Lab 09/10/17 0517  NA 135  K 2.9*  CL 101  CO2 24  GLUCOSE 166*  BUN 6  CREATININE 0.47  CALCIUM 8.1*   Cardiac Enzymes No results for input(s): TROPONINI in the last 168 hours. RADIOLOGY:  Ct Renal Stone Study  Result Date: 09/10/2017 CLINICAL DATA:  Left flank pain for 6 days. Dysuria. History of tubal ligation and cholecystectomy. EXAM: CT ABDOMEN AND PELVIS WITHOUT CONTRAST TECHNIQUE: Multidetector CT imaging of the abdomen and pelvis was performed following the standard protocol without IV contrast. COMPARISON:  CT 04/14/2012. FINDINGS: Lower chest: Clear lung bases. No significant pleural or pericardial effusion. Hepatobiliary: The liver demonstrates diffusely decreased density consistent with steatosis. No focal lesions are identified on noncontrast imaging. No significant biliary dilatation status post cholecystectomy. Pancreas: Unremarkable. No pancreatic ductal dilatation or surrounding inflammatory changes. Spleen: Normal in size without focal abnormality. Adrenals/Urinary Tract: Both adrenal glands appear normal. Both kidneys are within normal limits for size, although have mildly enlarged compared with the prior study. In addition, there is new perinephric soft tissue stranding bilaterally. No evidence of focal renal mass, perinephric fluid collection, hydronephrosis or urinary tract calculus. The bladder appears normal. Stomach/Bowel: No evidence of bowel wall thickening, distention or  surrounding inflammatory  change. There are fairly extensive diverticular changes throughout the colon. The appendix appears normal. Vascular/Lymphatic: Minimal aortoiliac atherosclerosis. No acute vascular findings are apparent on noncontrast imaging. Multiple mildly prominent lymph nodes are present within the porta hepatis and retroperitoneum. There is a 15 mm portacaval node on image 30 and a 12 mm left periaortic node on image 40. Reproductive: The uterus is mildly enlarged. There is a partially calcified 5.3 cm central uterine mass (sagittal image 113), consistent with a fibroid. No suspicious adnexal findings. Other: Tiny umbilical hernia containing only fat. Stable asymmetric fat in the left inguinal canal. As above, there is perinephric soft tissue stranding extending into the pelvis. No focal extraluminal fluid collections are seen. Musculoskeletal: No acute or significant osseous findings. Stable fatty atrophy within the rectus femoris muscles bilaterally. IMPRESSION: 1. Interval development of bilateral nephromegaly and perinephric soft tissue stranding. No hydronephrosis or urinary tract calculus demonstrated. These findings are nonspecific but may reflect diabetic nephropathy, acute interstitial nephritis or vasculitis. Correlate clinically. 2. Nonspecific mildly prominent retroperitoneal and porta hepatis lymphadenopathy, likely reactive. 3. Hepatic steatosis. 4. Colonic diverticulosis and uterine fibroid noted. Electronically Signed   By: Richardean Sale M.D.   On: 09/10/2017 14:52   ASSESSMENT AND PLAN:  56 year old female patient with history of hypertension, diabetes mellitus presented to the emergency room with left flank pain and fever.  1. Sepsis due to acute left pyelonephritis and Escherichia coli bacteremia -pt was on IV meropenem ---UC and BC ecoli pansensitive--change to IV cefazolin.  2.Hyponatremia. Improved.  3. Type 2 diabetes mellitus Start Lantus, continue sliding scale.  4. Hypertension.  Continue Lopressor.  If continues to improve d/c in am Case discussed with Care Management/Social Worker. Management plans discussed with the patient, family and they are in agreement.  CODE STATUS: full  DVT Prophylaxis: lovenox  TOTAL TIME TAKING CARE OF THIS PATIENT: **30 minutes.  >50% time spent on counselling and coordination of care  POSSIBLE D/C IN 1 DAYS, DEPENDING ON CLINICAL CONDITION.  Note: This dictation was prepared with Dragon dictation along with smaller phrase technology. Any transcriptional errors that result from this process are unintentional.  Maleyah Evans M.D on 09/10/2017 at 5:01 PM  Between 7am to 6pm - Pager - [phone]  After 6pm go to [email or web address] - password [email or web address]  [facility]  Office  [phone]  CC: Primary care physician; Inge Rise, NPPatient ID: Aura Dials, female   DOB: 08/22/1961, 56 y.o.   MRN: [id number]

## 2017-09-11 LAB — GLUCOSE, CAPILLARY
GLUCOSE-CAPILLARY: 171 mg/dL — AB (ref 65–99)
GLUCOSE-CAPILLARY: 187 mg/dL — AB (ref 65–99)

## 2017-09-11 LAB — CULTURE, BLOOD (ROUTINE X 2): Special Requests: ADEQUATE

## 2017-09-11 MED ORDER — CEPHALEXIN 500 MG PO CAPS
500.0000 mg | ORAL_CAPSULE | Freq: Three times a day (TID) | ORAL | Status: DC
  Administered 2017-09-11: 500 mg via ORAL
  Filled 2017-09-11: qty 1

## 2017-09-11 MED ORDER — CIPROFLOXACIN HCL 500 MG PO TABS: 500.0000 mg | ORAL_TABLET | Freq: Two times a day (BID) | ORAL | 0 refills | Status: DC

## 2017-09-11 MED ORDER — CIPROFLOXACIN HCL 500 MG PO TABS
500.0000 mg | ORAL_TABLET | Freq: Two times a day (BID) | ORAL | Status: DC
  Administered 2017-09-11: 12:00:00 500 mg via ORAL
  Filled 2017-09-11: qty 1

## 2017-09-11 NOTE — Progress Notes (Signed)
Received MD order to discharge patient to home reviewed home meds, prescriptions and discharge instructions with patient and patient verbalized understanding.

## 2017-09-11 NOTE — Progress Notes (Signed)
[facility] [facility] [place] NAME: Lesia Monica    MR#:  [id number]  DATE OF BIRTH:  1961-06-27  SUBJECTIVE:    Had low grade fever today No flank pain REVIEW OF SYSTEMS:   Review of Systems  Constitutional: Positive for fever. Negative for chills and weight loss.  HENT: Negative for ear discharge, ear pain and nosebleeds.   Eyes: Negative for blurred vision, pain and discharge.  Respiratory: Negative for sputum production, shortness of breath, wheezing and stridor.   Cardiovascular: Negative for chest pain, palpitations, orthopnea and PND.  Gastrointestinal: Negative for abdominal pain, diarrhea, nausea and vomiting.  Genitourinary: Negative for frequency and urgency.  Musculoskeletal: Negative for back pain and joint pain.  Neurological: Positive for weakness. Negative for sensory change, speech change and focal weakness.  Psychiatric/Behavioral: Negative for depression and hallucinations. The patient is not nervous/anxious.    Tolerating Diet:yes Tolerating PT: not needed  DRUG ALLERGIES:   Allergies  Allergen Reactions  . Percocet [Oxycodone-Acetaminophen] Itching    VITALS:  Blood pressure (!) 144/81, pulse 98, temperature 99.5 F (37.5 C), temperature source Oral, resp. rate 15, height 5\' 2"  (1.575 m), weight 83 kg (183 lb), last menstrual period 06/21/2017, SpO2 91 %.  PHYSICAL EXAMINATION:   Physical Exam  GENERAL:  56 y.o.-year-old patient lying in the bed with no acute distress. obese EYES: Pupils equal, round, reactive to light and accommodation. No scleral icterus. Extraocular muscles intact.  HEENT: Head atraumatic, normocephalic. Oropharynx and nasopharynx clear.  NECK:  Supple, no jugular venous distention. No thyroid enlargement, no tenderness.  LUNGS: Normal breath sounds bilaterally, no wheezing, rales, rhonchi. No use of accessory muscles of respiration.  CARDIOVASCULAR: S1, S2 normal. No murmurs, rubs, or  gallops.  ABDOMEN: Soft, nontender, nondistended. Bowel sounds present. No organomegaly or mass.  EXTREMITIES: No cyanosis, clubbing or edema b/l.    NEUROLOGIC: Cranial nerves II through XII are intact. No focal Motor or sensory deficits b/l.   PSYCHIATRIC:  patient is alert and oriented x 3.  SKIN: No obvious rash, lesion, or ulcer.   LABORATORY PANEL:  CBC  Recent Labs Lab 09/10/17 0517  WBC 5.3  HGB 12.3  HCT 34.9*  PLT 206    Chemistries   Recent Labs Lab 09/10/17 0517  NA 135  K 2.9*  CL 101  CO2 24  GLUCOSE 166*  BUN 6  CREATININE 0.47  CALCIUM 8.1*   Cardiac Enzymes No results for input(s): TROPONINI in the last 168 hours. RADIOLOGY:  Ct Renal Stone Study  Result Date: 09/10/2017 CLINICAL DATA:  Left flank pain for 6 days. Dysuria. History of tubal ligation and cholecystectomy. EXAM: CT ABDOMEN AND PELVIS WITHOUT CONTRAST TECHNIQUE: Multidetector CT imaging of the abdomen and pelvis was performed following the standard protocol without IV contrast. COMPARISON:  CT 04/14/2012. FINDINGS: Lower chest: Clear lung bases. No significant pleural or pericardial effusion. Hepatobiliary: The liver demonstrates diffusely decreased density consistent with steatosis. No focal lesions are identified on noncontrast imaging. No significant biliary dilatation status post cholecystectomy. Pancreas: Unremarkable. No pancreatic ductal dilatation or surrounding inflammatory changes. Spleen: Normal in size without focal abnormality. Adrenals/Urinary Tract: Both adrenal glands appear normal. Both kidneys are within normal limits for size, although have mildly enlarged compared with the prior study. In addition, there is new perinephric soft tissue stranding bilaterally. No evidence of focal renal mass, perinephric fluid collection, hydronephrosis or urinary tract calculus. The bladder appears normal. Stomach/Bowel: No evidence of bowel wall  thickening, distention or surrounding inflammatory  change. There are fairly extensive diverticular changes throughout the colon. The appendix appears normal. Vascular/Lymphatic: Minimal aortoiliac atherosclerosis. No acute vascular findings are apparent on noncontrast imaging. Multiple mildly prominent lymph nodes are present within the porta hepatis and retroperitoneum. There is a 15 mm portacaval node on image 30 and a 12 mm left periaortic node on image 40. Reproductive: The uterus is mildly enlarged. There is a partially calcified 5.3 cm central uterine mass (sagittal image 113), consistent with a fibroid. No suspicious adnexal findings. Other: Tiny umbilical hernia containing only fat. Stable asymmetric fat in the left inguinal canal. As above, there is perinephric soft tissue stranding extending into the pelvis. No focal extraluminal fluid collections are seen. Musculoskeletal: No acute or significant osseous findings. Stable fatty atrophy within the rectus femoris muscles bilaterally. IMPRESSION: 1. Interval development of bilateral nephromegaly and perinephric soft tissue stranding. No hydronephrosis or urinary tract calculus demonstrated. These findings are nonspecific but may reflect diabetic nephropathy, acute interstitial nephritis or vasculitis. Correlate clinically. 2. Nonspecific mildly prominent retroperitoneal and porta hepatis lymphadenopathy, likely reactive. 3. Hepatic steatosis. 4. Colonic diverticulosis and uterine fibroid noted. Electronically Signed   By: Richardean Sale M.D.   On: 09/10/2017 14:52   ASSESSMENT AND PLAN:  56 year old female patient with history of hypertension, diabetes mellitus presented to the emergency room with left flank pain and fever.  1. Sepsis due to acute left pyelonephritis and Escherichia coli bacteremia -pt was on IV meropenem ---UC and BC ecoli pansensitive--change to IV cefazolin. -cont with low grade fever CT abdomen negative for stones, shows bilateral perinephric stranding  2.Hyponatremia.  Improved.  3. Type 2 diabetes mellitus Start Lantus, continue sliding scale.  4. Hypertension. Continue Lopressor.  If continues to improve d/c in am Case discussed with Care Management/Social Worker. Management plans discussed with the patient, family and they are in agreement.  CODE STATUS: full  DVT Prophylaxis: lovenox  TOTAL TIME TAKING CARE OF THIS PATIENT: **30 minutes.  >50% time spent on counselling and coordination of care  POSSIBLE D/C IN 1 DAYS, DEPENDING ON CLINICAL CONDITION.  Note: This dictation was prepared with Dragon dictation along with smaller phrase technology. Any transcriptional errors that result from this process are unintentional.  Jia Dottavio M.D on 09/11/2017 at 11:39 AM  Between 7am to 6pm - Pager - [phone]  After 6pm go to [email or web address] - password [email or web address]  [facility]  Office  [phone]  CC: Primary care physician; Inge Rise, NPPatient ID: Aura Dials, female   DOB: 02-27-1961, 56 y.o.   MRN: [id number]

## 2017-09-11 NOTE — Discharge Summary (Signed)
[facility] [place] [facility] [place] NAME: Kristin Wilson    MR#:  [id number]  DATE OF BIRTH:  01-01-1961  DATE OF ADMISSION:  09/08/2017 ADMITTING PHYSICIAN: Saundra Shelling, MD  DATE OF DISCHARGE: 09/11/17  PRIMARY CARE PHYSICIAN: Inge Rise, NP    ADMISSION DIAGNOSIS:  Pyelonephritis [N12] Sepsis, due to unspecified organism ([place]) [A41.9]  DISCHARGE DIAGNOSIS:  Sepsis due to Acute pyelonephritis   SECONDARY DIAGNOSIS:   Past Medical History:  Diagnosis Date  . Diabetes mellitus without complication ([place])   . Hypertension   . Hyperthyroidism   . Osteoarthritis   . Sciatica     HOSPITAL COURSE:   56 year old female patient with history of hypertension, diabetes mellitus presented to the emergency room with left flank pain and fever.  1. Sepsis due to acute left pyelonephritisand Escherichia coli bacteremia -pt was on IV meropenem ---UC and BC ecoli pansensitive--changed to IV cefazolin---cipro bid for 14 days. -cont with low grade fever which is expected (diseas course), tolerating po diet CT abdomen negative for stones, shows bilateral perinephric stranding  2.Hyponatremia. Improved.  3. Type 2 diabetes mellitus Start Lantus, continue sliding scale.  4. Hypertension. Continue Lopressor.  Pt overall feels much better. Ok to d/c later tonite. F/u with PCP W/f fever and call pcp or come to ER--pt advised CONSULTS OBTAINED:  Treatment Team:  Leonel Ramsay, MD  DRUG ALLERGIES:   Allergies  Allergen Reactions  . Percocet [Oxycodone-Acetaminophen] Itching    DISCHARGE MEDICATIONS:   Current Discharge Medication List    START taking these medications   Details  ciprofloxacin (CIPRO) 500 MG tablet Take 1 tablet (500 mg total) by mouth 2 (two) times daily. Qty: 28 tablet, Refills: 0      CONTINUE these medications which have NOT CHANGED   Details  cyclobenzaprine (FLEXERIL) 5 MG tablet Take 1 tablet  (5 mg total) by mouth 3 (three) times daily as needed for muscle spasms. Qty: 15 tablet, Refills: 0    gabapentin (NEURONTIN) 300 MG capsule Take 300 mg by mouth daily as needed. Refills: 1    metFORMIN (GLUCOPHAGE) 500 MG tablet Take 500 mg by mouth 2 (two) times daily.    methimazole (TAPAZOLE) 10 MG tablet Take 1 tablet (10 mg total) by mouth 2 (two) times daily. Qty: 60 tablet, Refills: 0    methocarbamol (ROBAXIN) 500 MG tablet Take 1 tablet (500 mg total) by mouth 4 (four) times daily. Qty: 16 tablet, Refills: 0    metoprolol tartrate (LOPRESSOR) 25 MG tablet Take 1 tablet (25 mg total) by mouth 2 (two) times daily. Qty: 60 tablet, Refills: 0    albuterol (PROVENTIL HFA;VENTOLIN HFA) 108 (90 Base) MCG/ACT inhaler Inhale 2 puffs into the lungs every 4 (four) hours as needed for wheezing or shortness of breath. Qty: 1 Inhaler, Refills: 0    meclizine (ANTIVERT) 12.5 MG tablet Take 1 tablet (12.5 mg total) by mouth 3 (three) times daily as needed for dizziness. Qty: 15 tablet, Refills: 0    traMADol (ULTRAM) 50 MG tablet Take 1 tablet (50 mg total) by mouth every 6 (six) hours as needed. Qty: 12 tablet, Refills: 0      STOP taking these medications     aspirin EC 81 MG EC tablet      butalbital-acetaminophen-caffeine (FIORICET, ESGIC) 50-325-40 MG tablet      chlorpheniramine-HYDROcodone (TUSSIONEX PENNKINETIC ER) 10-8 MG/5ML SUER      diclofenac (VOLTAREN) 50 MG EC tablet  meloxicam (MOBIC) 15 MG tablet      phenazopyridine (PYRIDIUM) 100 MG tablet      predniSONE (STERAPRED UNI-PAK 21 TAB) 10 MG (21) TBPK tablet      sulfamethoxazole-trimethoprim (BACTRIM DS,SEPTRA DS) 800-160 MG tablet         If you experience worsening of your admission symptoms, develop shortness of breath, life threatening emergency, suicidal or homicidal thoughts you must seek medical attention immediately by calling 911 or calling your MD immediately  if symptoms less severe.  You  Must read complete instructions/literature along with all the possible adverse reactions/side effects for all the Medicines you take and that have been prescribed to you. Take any new Medicines after you have completely understood and accept all the possible adverse reactions/side effects.   Please note  You were cared for by a hospitalist during your hospital stay. If you have any questions about your discharge medications or the care you received while you were in the hospital after you are discharged, you can call the unit and asked to speak with the hospitalist on call if the hospitalist that took care of you is not available. Once you are discharged, your primary care physician will handle any further medical issues. Please note that NO REFILLS for any discharge medications will be authorized once you are discharged, as it is imperative that you return to your primary care physician (or establish a relationship with a primary care physician if you do not have one) for your aftercare needs so that they can reassess your need for medications and monitor your lab values.  DATA REVIEW:   CBC   Recent Labs Lab 09/10/17 0517  WBC 5.3  HGB 12.3  HCT 34.9*  PLT 206    Chemistries   Recent Labs Lab 09/10/17 0517  NA 135  K 2.9*  CL 101  CO2 24  GLUCOSE 166*  BUN 6  CREATININE 0.47  CALCIUM 8.1*    Microbiology Results   Recent Results (from the past 240 hour(s))  Urine culture     Status: Abnormal   Collection Time: 09/07/17  9:21 PM  Result Value Ref Range Status   Specimen Description URINE, RANDOM  Final   Special Requests NONE  Final   Culture >=100,000 COLONIES/mL ESCHERICHIA COLI (A)  Final   Report Status 09/10/2017 FINAL  Final   Organism ID, Bacteria ESCHERICHIA COLI (A)  Final      Susceptibility   Escherichia coli - MIC*    AMPICILLIN <=2 SENSITIVE Sensitive     CEFAZOLIN <=4 SENSITIVE Sensitive     CEFTRIAXONE <=1 SENSITIVE Sensitive     CIPROFLOXACIN <=0.25  SENSITIVE Sensitive     GENTAMICIN <=1 SENSITIVE Sensitive     IMIPENEM <=0.25 SENSITIVE Sensitive     NITROFURANTOIN <=16 SENSITIVE Sensitive     TRIMETH/SULFA <=20 SENSITIVE Sensitive     AMPICILLIN/SULBACTAM <=2 SENSITIVE Sensitive     PIP/TAZO <=4 SENSITIVE Sensitive     Extended ESBL NEGATIVE Sensitive     * >=100,000 COLONIES/mL ESCHERICHIA COLI  Blood culture (routine x 2)     Status: Abnormal   Collection Time: 09/08/17 12:16 AM  Result Value Ref Range Status   Specimen Description BLOOD LEFT ANTECUBITAL  Final   Special Requests   Final    BOTTLES DRAWN AEROBIC AND ANAEROBIC Blood Culture adequate volume   Culture  Setup Time   Final    GRAM NEGATIVE RODS IN BOTH AEROBIC AND ANAEROBIC BOTTLES CRITICAL RESULT CALLED TO,  READ BACK BY AND VERIFIED WITH: Hank Zompa @ [phone] 09/08/17 [place]    Culture ESCHERICHIA COLI (A)  Final   Report Status 09/11/2017 FINAL  Final   Organism ID, Bacteria ESCHERICHIA COLI  Final      Susceptibility   Escherichia coli - MIC*    AMPICILLIN <=2 SENSITIVE Sensitive     CEFAZOLIN <=4 SENSITIVE Sensitive     CEFEPIME <=1 SENSITIVE Sensitive     CEFTAZIDIME <=1 SENSITIVE Sensitive     CEFTRIAXONE <=1 SENSITIVE Sensitive     CIPROFLOXACIN <=0.25 SENSITIVE Sensitive     GENTAMICIN <=1 SENSITIVE Sensitive     IMIPENEM <=0.25 SENSITIVE Sensitive     TRIMETH/SULFA <=20 SENSITIVE Sensitive     AMPICILLIN/SULBACTAM <=2 SENSITIVE Sensitive     PIP/TAZO <=4 SENSITIVE Sensitive     Extended ESBL NEGATIVE Sensitive     * ESCHERICHIA COLI  Blood Culture ID Panel (Reflexed)     Status: Abnormal   Collection Time: 09/08/17 12:16 AM  Result Value Ref Range Status   Enterococcus species NOT DETECTED NOT DETECTED Final   Listeria monocytogenes NOT DETECTED NOT DETECTED Final   Staphylococcus species NOT DETECTED NOT DETECTED Final   Staphylococcus aureus NOT DETECTED NOT DETECTED Final   Streptococcus species NOT DETECTED NOT DETECTED Final   Streptococcus  agalactiae NOT DETECTED NOT DETECTED Final   Streptococcus pneumoniae NOT DETECTED NOT DETECTED Final   Streptococcus pyogenes NOT DETECTED NOT DETECTED Final   Acinetobacter baumannii NOT DETECTED NOT DETECTED Final   Enterobacteriaceae species DETECTED (A) NOT DETECTED Final    Comment: Enterobacteriaceae represent a large family of gram-negative bacteria, not a single organism. CRITICAL RESULT CALLED TO, READ BACK BY AND VERIFIED WITH: Hank Zompa @ [phone] 09/08/17 [place]    Enterobacter cloacae complex NOT DETECTED NOT DETECTED Final   Escherichia coli DETECTED (A) NOT DETECTED Final    Comment: CRITICAL RESULT CALLED TO, READ BACK BY AND VERIFIED WITH: Hank Zompa @ 0254 09/08/17 [place]    Klebsiella oxytoca NOT DETECTED NOT DETECTED Final   Klebsiella pneumoniae NOT DETECTED NOT DETECTED Final   Proteus species NOT DETECTED NOT DETECTED Final   Serratia marcescens NOT DETECTED NOT DETECTED Final   Carbapenem resistance NOT DETECTED NOT DETECTED Final   Haemophilus influenzae NOT DETECTED NOT DETECTED Final   Neisseria meningitidis NOT DETECTED NOT DETECTED Final   Pseudomonas aeruginosa NOT DETECTED NOT DETECTED Final   Candida albicans NOT DETECTED NOT DETECTED Final   Candida glabrata NOT DETECTED NOT DETECTED Final   Candida krusei NOT DETECTED NOT DETECTED Final   Candida parapsilosis NOT DETECTED NOT DETECTED Final   Candida tropicalis NOT DETECTED NOT DETECTED Final  Blood culture (routine x 2)     Status: Abnormal   Collection Time: 09/08/17 12:59 AM  Result Value Ref Range Status   Specimen Description BLOOD RIGHT ANTECUBITAL  Final   Special Requests   Final    BOTTLES DRAWN AEROBIC AND ANAEROBIC Blood Culture adequate volume   Culture  Setup Time   Final    GRAM NEGATIVE RODS IN BOTH AEROBIC AND ANAEROBIC BOTTLES CRITICAL VALUE NOTED.  VALUE IS CONSISTENT WITH PREVIOUSLY REPORTED AND CALLED VALUE.    Culture (A)  Final    ESCHERICHIA COLI SUSCEPTIBILITIES PERFORMED ON  PREVIOUS CULTURE WITHIN THE LAST 5 DAYS.    Report Status 09/10/2017 FINAL  Final    RADIOLOGY:  Ct Renal Stone Study  Result Date: 09/10/2017 CLINICAL DATA:  Left flank pain for 6 days. Dysuria.  History of tubal ligation and cholecystectomy. EXAM: CT ABDOMEN AND PELVIS WITHOUT CONTRAST TECHNIQUE: Multidetector CT imaging of the abdomen and pelvis was performed following the standard protocol without IV contrast. COMPARISON:  CT 04/14/2012. FINDINGS: Lower chest: Clear lung bases. No significant pleural or pericardial effusion. Hepatobiliary: The liver demonstrates diffusely decreased density consistent with steatosis. No focal lesions are identified on noncontrast imaging. No significant biliary dilatation status post cholecystectomy. Pancreas: Unremarkable. No pancreatic ductal dilatation or surrounding inflammatory changes. Spleen: Normal in size without focal abnormality. Adrenals/Urinary Tract: Both adrenal glands appear normal. Both kidneys are within normal limits for size, although have mildly enlarged compared with the prior study. In addition, there is new perinephric soft tissue stranding bilaterally. No evidence of focal renal mass, perinephric fluid collection, hydronephrosis or urinary tract calculus. The bladder appears normal. Stomach/Bowel: No evidence of bowel wall thickening, distention or surrounding inflammatory change. There are fairly extensive diverticular changes throughout the colon. The appendix appears normal. Vascular/Lymphatic: Minimal aortoiliac atherosclerosis. No acute vascular findings are apparent on noncontrast imaging. Multiple mildly prominent lymph nodes are present within the porta hepatis and retroperitoneum. There is a 15 mm portacaval node on image 30 and a 12 mm left periaortic node on image 40. Reproductive: The uterus is mildly enlarged. There is a partially calcified 5.3 cm central uterine mass (sagittal image 113), consistent with a fibroid. No suspicious  adnexal findings. Other: Tiny umbilical hernia containing only fat. Stable asymmetric fat in the left inguinal canal. As above, there is perinephric soft tissue stranding extending into the pelvis. No focal extraluminal fluid collections are seen. Musculoskeletal: No acute or significant osseous findings. Stable fatty atrophy within the rectus femoris muscles bilaterally. IMPRESSION: 1. Interval development of bilateral nephromegaly and perinephric soft tissue stranding. No hydronephrosis or urinary tract calculus demonstrated. These findings are nonspecific but may reflect diabetic nephropathy, acute interstitial nephritis or vasculitis. Correlate clinically. 2. Nonspecific mildly prominent retroperitoneal and porta hepatis lymphadenopathy, likely reactive. 3. Hepatic steatosis. 4. Colonic diverticulosis and uterine fibroid noted. Electronically Signed   By: Richardean Sale M.D.   On: 09/10/2017 14:52     Management plans discussed with the patient, family and they are in agreement.  CODE STATUS:     Code Status Orders        Start     Ordered   09/08/17 1118  Full code  Continuous     09/08/17 1117    Code Status History    Date Active Date Inactive Code Status Order ID Comments User Context   10/08/2015  4:43 AM 10/08/2015  7:21 PM Full Code [id number]  Rise Patience, MD Inpatient      TOTAL TIME TAKING CARE OF THIS PATIENT: 40 minutes.    Zac Torti M.D on 09/11/2017 at 3:43 PM  Between 7am to 6pm - Pager - [phone] After 6pm go to [email or web address] - password [email or web address] [facility]  Office  [phone]  CC: Primary care physician; Inge Rise, NP

## 2017-09-11 NOTE — Progress Notes (Signed)
Inpatient Diabetes Program Recommendations  AACE/ADA: New Consensus Statement on Inpatient Glycemic Control (2015)  Target Ranges:  Prepandial:   less than 140 mg/dL      Peak postprandial:   less than 180 mg/dL (1-2 hours)      Critically ill patients:  140 - 180 mg/dL   Results for [facility], Kristin Wilson (MRN [id number]) as of 09/11/2017 14:53  Ref. Range 09/10/2017 07:40 09/10/2017 12:01 09/10/2017 16:44 09/10/2017 20:30 09/11/2017 07:25 09/11/2017 11:39  Glucose-Capillary Latest Ref Range: 65 - 99 mg/dL 159 (H) 158 (H) 152 (H) 180 (H) 171 (H) 187 (H)  Results for [facility], Kristin Wilson (MRN [id number]) as of 09/11/2017 14:53  Ref. Range 09/08/2017 11:37  Hemoglobin A1C Latest Ref Range: 4.8 - 5.6 % 9.6 (H)   Review of Glycemic Control  Diabetes history: DM2 Outpatient Diabetes medications: Metformin 500 mg BID Current orders for Inpatient glycemic control: Lantus 15 units daily, Novolog 4 units TID with meals, Novolog 0-9 units TID with meals, Novolog 0-5 units QHS  Inpatient Diabetes Program Recommendations: HgbA1C: A1C 9.6% on 09/08/17 indicating an average glucose of 229 mg/dl over the past 2-3 months.   NOTE: Spoke with patient (over phone) about diabetes and home regimen for diabetes control. Patient reports that she is followed by PCP for diabetes management and currently she takes Metformin 500 mg BID as an outpatient for diabetes control. Patient reports that she is taking Metformin as prescribed. Patient reports that her PCP prescribed another oral DM medication Wilson few days ago and it was called in to [facility] but patient has not had an opportunity to pick it up yet. Patient states that she is not sure of the name of the medication but "it starts with Wilson 'G'". Patient states that she checks her glucose 1 time per day and that it is usually in the 200's mg/d.  Inquired about prior A1C and patient reports that her last A1C was in the 9% range. Discussed A1C results (9.6% on 09/08/17) and  explained that her current A1C indicates an average glucose of 229 mg/dl over the past 2-3 months. Discussed glucose and A1C goals. Discussed importance of checking CBGs and maintaining good CBG control to prevent long-term and short-term complications. Explained how hyperglycemia leads to damage within blood vessels which lead to the common complications seen with uncontrolled diabetes. Stressed to the patient the importance of improving glycemic control to prevent further complications from uncontrolled diabetes. Discussed impact of nutrition, exercise, stress, sickness, and medications on diabetes control. Discussed oral medications versus insulin for DM control.  Encouraged patient to pick up new oral DM medication from pharmacy and encouraged patient to check her glucose at least 2-3 times per day.  Explained that her PCP can use glucose trends to help with adjustments of DM medications if needed.  Patient verbalized understanding of information discussed and she states that she has no further questions at this time related to diabetes. CBGs are trending fair at this time. Will continue to follow along while inpatient.  Thanks, Barnie Alderman, RN, MSN, CDE Diabetes Coordinator Inpatient Diabetes Program [phone] (Team Pager)
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
## 2017-10-19 ENCOUNTER — Encounter: Payer: Self-pay | Admitting: *Deleted

## 2017-10-19 ENCOUNTER — Emergency Department: Payer: Self-pay

## 2017-10-19 ENCOUNTER — Other Ambulatory Visit: Payer: Self-pay

## 2017-10-19 ENCOUNTER — Emergency Department
Admission: EM | Admit: 2017-10-19 | Discharge: 2017-10-20 | Disposition: A | Discharge status: Home / Self Care | Payer: Self-pay | Attending: Emergency Medicine | Admitting: Emergency Medicine

## 2017-10-19 DIAGNOSIS — E119 Type 2 diabetes mellitus without complications: Secondary | ICD-10-CM | POA: Insufficient documentation

## 2017-10-19 DIAGNOSIS — Z79899 Other long term (current) drug therapy: Secondary | ICD-10-CM | POA: Insufficient documentation

## 2017-10-19 DIAGNOSIS — Z7984 Long term (current) use of oral hypoglycemic drugs: Secondary | ICD-10-CM | POA: Insufficient documentation

## 2017-10-19 DIAGNOSIS — I10 Essential (primary) hypertension: Secondary | ICD-10-CM | POA: Insufficient documentation

## 2017-10-19 DIAGNOSIS — R059 Cough, unspecified: Secondary | ICD-10-CM

## 2017-10-19 DIAGNOSIS — R509 Fever, unspecified: Secondary | ICD-10-CM | POA: Insufficient documentation

## 2017-10-19 DIAGNOSIS — R05 Cough: Secondary | ICD-10-CM | POA: Insufficient documentation

## 2017-10-19 HISTORY — DX: Urinary tract infection, site not specified: N39.0

## 2017-10-19 HISTORY — DX: Tubulo-interstitial nephritis, not specified as acute or chronic: N12

## 2017-10-19 LAB — CBC
HEMATOCRIT: 42 % (ref 35.0–47.0)
HEMOGLOBIN: 14.6 g/dL (ref 12.0–16.0)
MCH: 27 pg (ref 26.0–34.0)
MCHC: 34.8 g/dL (ref 32.0–36.0)
MCV: 77.8 fL — ABNORMAL LOW (ref 80.0–100.0)
Platelets: 298 10*3/uL (ref 150–440)
RBC: 5.4 MIL/uL — AB (ref 3.80–5.20)
RDW: 13.7 % (ref 11.5–14.5)
WBC: 6.3 10*3/uL (ref 3.6–11.0)

## 2017-10-19 LAB — COMPREHENSIVE METABOLIC PANEL
ALBUMIN: 4.2 g/dL (ref 3.5–5.0)
ALT: 20 U/L (ref 14–54)
ANION GAP: 13 (ref 5–15)
AST: 24 U/L (ref 15–41)
Alkaline Phosphatase: 77 U/L (ref 38–126)
BILIRUBIN TOTAL: 0.5 mg/dL (ref 0.3–1.2)
BUN: 11 mg/dL (ref 6–20)
CO2: 24 mmol/L (ref 22–32)
Calcium: 9.7 mg/dL (ref 8.9–10.3)
Chloride: 101 mmol/L (ref 101–111)
Creatinine, Ser: 0.55 mg/dL (ref 0.44–1.00)
GFR calc Af Amer: >60 mL/min (ref >60)
GFR calc non Af Amer: >60 mL/min (ref >60)
GLUCOSE: 183 mg/dL — AB (ref 65–99)
POTASSIUM: 3.7 mmol/L (ref 3.5–5.1)
Sodium: 138 mmol/L (ref 135–145)
TOTAL PROTEIN: 7.8 g/dL (ref 6.5–8.1)

## 2017-10-19 MED ORDER — ALBUTEROL SULFATE (2.5 MG/3ML) 0.083% IN NEBU: 5.0000 mg | INHALATION_SOLUTION | Freq: Once | RESPIRATORY_TRACT | Status: DC

## 2017-10-19 MED ORDER — PREDNISONE 20 MG PO TABS
30.0000 mg | ORAL_TABLET | Freq: Once | ORAL | Status: AC
  Administered 2017-10-19: 30 mg via ORAL
  Filled 2017-10-19: qty 1

## 2017-10-19 MED ORDER — HYDROCOD POLST-CPM POLST ER 10-8 MG/5ML PO SUER
5.0000 mL | Freq: Once | ORAL | Status: AC
  Administered 2017-10-19: 5 mL via ORAL
  Filled 2017-10-19: qty 5

## 2017-10-19 MED ORDER — IPRATROPIUM-ALBUTEROL 0.5-2.5 (3) MG/3ML IN SOLN
3.0000 mL | Freq: Once | RESPIRATORY_TRACT | Status: AC
  Administered 2017-10-19: 3 mL via RESPIRATORY_TRACT
  Filled 2017-10-19: qty 3

## 2017-10-19 NOTE — ED Triage Notes (Signed)
Pt reports cough and mild fever. Pt has persistent cough during triage. Pt denies shortness of breath, but sounds as if she is dyspneic. Pt having difficulty completing sentences due to coughing and subsequent shortness of breath. Pt state cough x 2 weeks, worsening in past 3-4 days.

## 2017-10-19 NOTE — ED Provider Notes (Signed)
[facility] Emergency Department Provider Note   ____________________________________________   First MD Initiated Contact with Patient 10/19/17 2314     (approximate)  I have reviewed the triage vital signs and the nursing notes.   HISTORY  Chief Complaint Cough and Fever    HPI Kristin Wilson is a 56 y.o. female who presents to the ED from home with a chief complaint of fever and cough.  Patient reports a one-week history of low-grade fever (T-max 99 F), cough productive of yellow or clear sputum, runny nose, congestion, posttussive emesis.  Denies associated chest pain, shortness of breath, abdominal pain, nausea, vomiting, diarrhea.  Denies recent travel or trauma.  Has been taking OTC cough medicines without relief of symptoms.   Past Medical History:  Diagnosis Date  . Diabetes mellitus without complication ([place])   . Hypertension   . Hyperthyroidism   . Osteoarthritis   . Pyelonephritis   . Sciatica   . Urinary tract infection     Patient Active Problem List   Diagnosis Date Noted  . Sepsis ([place]) 09/08/2017  . Chest discomfort 10/08/2015  . DOE (dyspnea on exertion) 10/08/2015  . Hyperglycemia 10/08/2015    Past Surgical History:  Procedure Laterality Date  . cholecystecomy    . CHOLECYSTECTOMY    . TUBAL LIGATION      Prior to Admission medications   Medication Sig Start Date End Date Taking? Authorizing Provider  albuterol (PROVENTIL HFA;VENTOLIN HFA) 108 (90 Base) MCG/ACT inhaler Inhale 2 puffs into the lungs every 4 (four) hours as needed for wheezing or shortness of breath. Patient not taking: Reported on 09/08/2017 03/26/16   Paulette Blanch, MD  ciprofloxacin (CIPRO) 500 MG tablet Take 1 tablet (500 mg total) by mouth 2 (two) times daily. 09/11/17   Fritzi Mandes, MD  cyclobenzaprine (FLEXERIL) 5 MG tablet Take 1 tablet (5 mg total) by mouth 3 (three) times daily as needed for muscle spasms. 08/23/17   [name], [name],  PA-C  gabapentin (NEURONTIN) 300 MG capsule Take 300 mg by mouth daily as needed. 06/25/17   [provider]  meclizine (ANTIVERT) 12.5 MG tablet Take 1 tablet (12.5 mg total) by mouth 3 (three) times daily as needed for dizziness. Patient not taking: Reported on 09/08/2017 08/23/17   [name], [name], PA-C  metFORMIN (GLUCOPHAGE) 500 MG tablet Take 500 mg by mouth 2 (two) times daily. 10/27/16 10/27/17  [provider]  methimazole (TAPAZOLE) 10 MG tablet Take 1 tablet (10 mg total) by mouth 2 (two) times daily. 10/08/15   [name], [name], MD  methocarbamol (ROBAXIN) 500 MG tablet Take 1 tablet (500 mg total) by mouth 4 (four) times daily. 08/20/17   [name], [name], PA-C  metoprolol tartrate (LOPRESSOR) 25 MG tablet Take 1 tablet (25 mg total) by mouth 2 (two) times daily. 10/08/15   [name], [name], MD  traMADol (ULTRAM) 50 MG tablet Take 1 tablet (50 mg total) by mouth every 6 (six) hours as needed. Patient not taking: Reported on 09/08/2017 01/19/16   Loney Hering, MD    Allergies Patient has no active allergies.  Family History  Problem Relation Age of Onset  . Heart failure Mother   . Diabetic kidney disease Paternal Grandmother   . CAD Paternal Grandmother     Social History Social History   Tobacco Use  . Smoking status: Never Smoker  . Smokeless tobacco: Never Used  Substance Use Topics  . Alcohol use:  No  . Drug use: No    Review of Systems  Constitutional: Positive for low-grade fevers. Eyes: No visual changes. ENT: No sore throat. Cardiovascular: Denies chest pain. Respiratory: Positive for cough.  Denies shortness of breath. Gastrointestinal: No abdominal pain.  Positive for posttussive emesis.  No diarrhea.  No constipation. Genitourinary: Negative for dysuria. Musculoskeletal: Negative for back pain. Skin: Negative for rash. Neurological: Negative for headaches, focal weakness or  numbness.   ____________________________________________   PHYSICAL EXAM:  VITAL SIGNS: ED Triage Vitals  Enc Vitals Group     BP 10/19/17 2126 (!) 188/110     Pulse Rate 10/19/17 2126 (!) 112     Resp 10/19/17 2126 20     Temp 10/19/17 2126 98.4 F (36.9 C)     Temp Source 10/19/17 2126 Oral     SpO2 10/19/17 2126 98 %     Weight 10/19/17 2127 170 lb (77.1 kg)     Height 10/19/17 2127 5\' 2"  (1.575 m)     Head Circumference --      Peak Flow --      Pain Score --      Pain Loc --      Pain Edu? --      Excl. in [place]? --     Constitutional: Alert and oriented. Well appearing and in no acute distress. Eyes: Conjunctivae are normal. PERRL. EOMI. Head: Atraumatic. Nose: Congestion/rhinnorhea. Mouth/Throat: Mucous membranes are moist.  Oropharynx mildly erythematous. Neck: No stridor.  Supple neck without meningismus. Cardiovascular: Normal rate, regular rhythm. Grossly normal heart sounds.  Good peripheral circulation. Respiratory: Normal respiratory effort.  No retractions. Lungs with scattered rhonchi which clear with breathing.  Slightly diminished bibasilarly. Gastrointestinal: Soft and nontender. No distention. No abdominal bruits. No CVA tenderness. Musculoskeletal: No lower extremity tenderness nor edema.  No joint effusions. Neurologic:  Normal speech and language. No gross focal neurologic deficits are appreciated.  Skin:  Skin is warm, dry and intact. No rash noted. Psychiatric: Mood and affect are normal. Speech and behavior are normal.  ____________________________________________   LABS (all labs ordered are listed, but only abnormal results are displayed)  Labs Reviewed  COMPREHENSIVE METABOLIC PANEL - Abnormal; Notable for the following components:      Result Value   Glucose, Bld 183 (*)    All other components within normal limits  CBC - Abnormal; Notable for the following components:   RBC 5.40 (*)    MCV 77.8 (*)    All other components within  normal limits  URINALYSIS, ROUTINE W REFLEX MICROSCOPIC   ____________________________________________  EKG  ED ECG REPORT I, SUNG,JADE J, the attending physician, personally viewed and interpreted this ECG.   Date: 10/19/2017  EKG Time: 2145  Rate: 99  Rhythm: normal EKG, normal sinus rhythm  Axis: Normal  Intervals:none  ST&T Change: Nonspecific  ____________________________________________  RADIOLOGY  Dg Chest 2 View  Result Date: 10/19/2017 CLINICAL DATA:  Cough for 2 weeks EXAM: CHEST  2 VIEW COMPARISON:  Chest radiograph 08/05/2017 FINDINGS: The heart size and mediastinal contours are within normal limits. Both lungs are clear. The visualized skeletal structures are unremarkable. IMPRESSION: No active cardiopulmonary disease. Electronically Signed   By: Ulyses Jarred M.D.   On: 10/19/2017 22:25    ____________________________________________   PROCEDURES  Procedure(s) performed: None  Procedures  Critical Care performed: No  ____________________________________________   INITIAL IMPRESSION / ASSESSMENT AND PLAN / ED COURSE  As part of my medical decision making, I reviewed the  following data within the [place] History obtained from family, Nursing notes reviewed and incorporated, Labs reviewed, EKG interpreted, Radiograph reviewed  and Notes from prior ED visits.   56 year old female who presents with a one-week history of cold like symptoms. Differential includes, but is not limited to, viral syndrome, bronchitis including COPD exacerbation, pneumonia, reactive airway disease including asthma, CHF including exacerbation with or without pulmonary/interstitial edema, pneumothorax, ACS, thoracic trauma, and pulmonary embolism.   Labwork and CXR unremarkable.  Will administer DuoNeb, initiate Medrol Dosepak, Tussionex and reassess.  Clinical Course as of Oct 20 41  Sat Oct 20, 2017  0041 Lungs clear to auscultation after nebulizer  treatment.  Room air saturations 96%.  Will discharge home with prescriptions for albuterol inhaler, Medrol Dosepak and Tussionex.  Strict return precautions given.  Patient and spouse verbalize understanding and agree with plan of care.  [JS]    Clinical Course User Index [JS] Paulette Blanch, MD     ____________________________________________   FINAL CLINICAL IMPRESSION(S) / ED DIAGNOSES  Final diagnoses:  Cough  Fever, unspecified fever cause     ED Discharge Orders    None       Note:  This document was prepared using Dragon voice recognition software and may include unintentional dictation errors.    Paulette Blanch, MD 10/20/17 [phone]

## 2017-10-20 MED ORDER — ALBUTEROL SULFATE HFA 108 (90 BASE) MCG/ACT IN AERS: 2.0000 | INHALATION_SPRAY | RESPIRATORY_TRACT | 0 refills | Status: DC | PRN

## 2017-10-20 MED ORDER — HYDROCOD POLST-CPM POLST ER 10-8 MG/5ML PO SUER: 5.0000 mL | Freq: Two times a day (BID) | ORAL | 0 refills | Status: DC

## 2017-10-20 MED ORDER — METHYLPREDNISOLONE 4 MG PO TBPK: ORAL_TABLET | ORAL | 0 refills | Status: DC

## 2017-10-20 NOTE — Discharge Instructions (Signed)
1.  You may alternate Tylenol and ibuprofen every 4 hours as needed for fever greater than 100.4 F. 2.  Take Medrol Dosepak as instructed. 3.  You may take cough medicine as needed (Tussionex). 4.  Use albuterol inhaler 2 puffs every 4 hours as needed for coughing/wheezing/difficulty breathing. 5.  Return to the ER for worsening symptoms, persistent vomiting, difficulty breathing or other concerns.

## 2017-10-20 NOTE — ED Notes (Signed)

## 2018-08-06 DIAGNOSIS — H524 Presbyopia: Secondary | ICD-10-CM | POA: Diagnosis not present

## 2018-08-06 DIAGNOSIS — E119 Type 2 diabetes mellitus without complications: Secondary | ICD-10-CM | POA: Diagnosis not present

## 2018-09-04 ENCOUNTER — Emergency Department
Admission: EM | Admit: 2018-09-04 | Discharge: 2018-09-05 | Disposition: A | Discharge status: Home / Self Care | Payer: Medicare HMO | Attending: Emergency Medicine | Admitting: Emergency Medicine

## 2018-09-04 DIAGNOSIS — R109 Unspecified abdominal pain: Secondary | ICD-10-CM

## 2018-09-04 DIAGNOSIS — I10 Essential (primary) hypertension: Secondary | ICD-10-CM | POA: Insufficient documentation

## 2018-09-04 DIAGNOSIS — Z7984 Long term (current) use of oral hypoglycemic drugs: Secondary | ICD-10-CM | POA: Diagnosis not present

## 2018-09-04 DIAGNOSIS — D259 Leiomyoma of uterus, unspecified: Secondary | ICD-10-CM | POA: Insufficient documentation

## 2018-09-04 DIAGNOSIS — E119 Type 2 diabetes mellitus without complications: Secondary | ICD-10-CM | POA: Insufficient documentation

## 2018-09-04 DIAGNOSIS — Z79899 Other long term (current) drug therapy: Secondary | ICD-10-CM | POA: Diagnosis not present

## 2018-09-04 LAB — CBC
HEMATOCRIT: 42 % (ref 36.0–46.0)
Hemoglobin: 14.6 g/dL (ref 12.0–15.0)
MCH: 26.9 pg (ref 26.0–34.0)
MCHC: 34.8 g/dL (ref 30.0–36.0)
MCV: 77.5 fL — AB (ref 80.0–100.0)
Platelets: 260 10*3/uL (ref 150–400)
RBC: 5.42 MIL/uL — ABNORMAL HIGH (ref 3.87–5.11)
RDW: 12.4 % (ref 11.5–15.5)
WBC: 6.9 10*3/uL (ref 4.0–10.5)
nRBC: 0 % (ref 0.0–0.2)

## 2018-09-04 LAB — BASIC METABOLIC PANEL
Anion gap: 8 (ref 5–15)
BUN: 17 mg/dL (ref 6–20)
CHLORIDE: 103 mmol/L (ref 98–111)
CO2: 26 mmol/L (ref 22–32)
CREATININE: 0.39 mg/dL — AB (ref 0.44–1.00)
Calcium: 9.5 mg/dL (ref 8.9–10.3)
GFR calc Af Amer: >60 mL/min (ref >60)
GFR calc non Af Amer: >60 mL/min (ref >60)
Glucose, Bld: 163 mg/dL — ABNORMAL HIGH (ref 70–99)
Potassium: 4.3 mmol/L (ref 3.5–5.1)
SODIUM: 137 mmol/L (ref 135–145)

## 2018-09-04 LAB — URINALYSIS, COMPLETE (UACMP) WITH MICROSCOPIC
BILIRUBIN URINE: NEGATIVE
Glucose, UA: NEGATIVE mg/dL
HGB URINE DIPSTICK: NEGATIVE
KETONES UR: NEGATIVE mg/dL
Leukocytes, UA: NEGATIVE
NITRITE: NEGATIVE
Protein, ur: NEGATIVE mg/dL
Specific Gravity, Urine: 1.003 — ABNORMAL LOW (ref 1.005–1.030)
WBC, UA: NONE SEEN WBC/hpf (ref 0–5)
pH: 6 (ref 5.0–8.0)

## 2018-09-04 NOTE — ED Triage Notes (Signed)
Patient c/o left flank pain, radiating to left abdomen. Patient reports frequent urination, with intermittently decreased output.

## 2018-09-04 NOTE — ED Provider Notes (Signed)
[facility] Emergency Department Provider Note  ____________________________________________   First MD Initiated Contact with Patient 09/04/18 2346     (approximate)  I have reviewed the triage vital signs and the nursing notes.   HISTORY  Chief Complaint Flank Pain   HPI  Kristin Wilson is a 57 y.o. female self presents to the emergency department with left flank pain radiating towards her left abdomen.  She also reports dysuria and difficulty initiating urination.  No fevers or chills.  Her pain came on gradually.  It is moderate severity.  Seems to be somewhat worse with movement somewhat improved with rest.  She has no history of kidney stones.    Past Medical History:  Diagnosis Date  . Diabetes mellitus without complication ([place])   . Hypertension   . Hyperthyroidism   . Osteoarthritis   . Pyelonephritis   . Sciatica   . Urinary tract infection     Patient Active Problem List   Diagnosis Date Noted  . Sepsis ([place]) 09/08/2017  . Chest discomfort 10/08/2015  . DOE (dyspnea on exertion) 10/08/2015  . Hyperglycemia 10/08/2015    Past Surgical History:  Procedure Laterality Date  . cholecystecomy    . CHOLECYSTECTOMY    . TUBAL LIGATION      Prior to Admission medications   Medication Sig Start Date End Date Taking? Authorizing Provider  albuterol (PROVENTIL HFA;VENTOLIN HFA) 108 (90 Base) MCG/ACT inhaler Inhale 2 puffs into the lungs every 4 (four) hours as needed for wheezing or shortness of breath. 10/20/17   Paulette Blanch, MD  chlorpheniramine-HYDROcodone (TUSSIONEX PENNKINETIC ER) 10-8 MG/5ML SUER Take 5 mLs by mouth 2 (two) times daily. 10/20/17   Paulette Blanch, MD  ciprofloxacin (CIPRO) 500 MG tablet Take 1 tablet (500 mg total) by mouth 2 (two) times daily. 09/11/17   Fritzi Mandes, MD  cyclobenzaprine (FLEXERIL) 5 MG tablet Take 1 tablet (5 mg total) by mouth 3 (three) times daily as needed for muscle spasms. 08/23/17   [name],  [name], PA-C  gabapentin (NEURONTIN) 300 MG capsule Take 300 mg by mouth daily as needed. 06/25/17   [provider]  ibuprofen (ADVIL,MOTRIN) 600 MG tablet Take 1 tablet (600 mg total) by mouth every 8 (eight) hours as needed. 09/05/18   Darel Hong, MD  meclizine (ANTIVERT) 12.5 MG tablet Take 1 tablet (12.5 mg total) by mouth 3 (three) times daily as needed for dizziness. Patient not taking: Reported on 09/08/2017 08/23/17   [name], [name], PA-C  metFORMIN (GLUCOPHAGE) 500 MG tablet Take 500 mg by mouth 2 (two) times daily. 10/27/16 10/27/17  [provider]  methimazole (TAPAZOLE) 10 MG tablet Take 1 tablet (10 mg total) by mouth 2 (two) times daily. 10/08/15   [name], [name], MD  methocarbamol (ROBAXIN) 500 MG tablet Take 1 tablet (500 mg total) by mouth 4 (four) times daily. 08/20/17   [name], [name], PA-C  methylPREDNISolone (MEDROL DOSEPAK) 4 MG TBPK tablet Take as directed 10/20/17   Paulette Blanch, MD  metoprolol tartrate (LOPRESSOR) 25 MG tablet Take 1 tablet (25 mg total) by mouth 2 (two) times daily. 10/08/15   [name], [name], MD  oxyCODONE-acetaminophen (PERCOCET/ROXICET) 5-325 MG tablet Take 1 tablet by mouth every 4 (four) hours as needed for severe pain. 09/05/18   Darel Hong, MD  traMADol (ULTRAM) 50 MG tablet Take 1 tablet (50 mg total) by mouth every 6 (six) hours as needed. Patient not taking: Reported  on 09/08/2017 01/19/16   Loney Hering, MD    Allergies Patient has no known allergies.  Family History  Problem Relation Age of Onset  . Heart failure Mother   . Diabetic kidney disease Paternal Grandmother   . CAD Paternal Grandmother     Social History Social History   Tobacco Use  . Smoking status: Never Smoker  . Smokeless tobacco: Never Used  Substance Use Topics  . Alcohol use: No  . Drug use: No    Review of Systems Constitutional: No fever/chills Eyes: No visual changes. ENT: No sore  throat. Cardiovascular: Denies chest pain. Respiratory: Denies shortness of breath. Gastrointestinal: Positive for abdominal pain.  No nausea, no vomiting.  No diarrhea.  No constipation. Genitourinary: Positive for dysuria. Musculoskeletal: Positive for back pain. Skin: Negative for rash. Neurological: Negative for headaches, focal weakness or numbness.   ____________________________________________   PHYSICAL EXAM:  VITAL SIGNS: ED Triage Vitals  Enc Vitals Group     BP 09/04/18 2326 (!) 182/77     Pulse Rate 09/04/18 2326 80     Resp 09/04/18 2326 18     Temp 09/04/18 2326 97.7 F (36.5 C)     Temp Source 09/04/18 2326 Oral     SpO2 09/04/18 2326 98 %     Weight 09/04/18 2327 171 lb 15.3 oz (78 kg)     Height 09/04/18 2327 5\' 2"  (1.575 m)     Head Circumference --      Peak Flow --      Pain Score 09/04/18 2326 7     Pain Loc --      Pain Edu? --      Excl. in [place]? --     Constitutional: Alert and oriented x4 appears somewhat uncomfortable nontoxic no diaphoresis speaks in full clear sentences Eyes: PERRL EOMI. Head: Atraumatic. Nose: No congestion/rhinnorhea. Mouth/Throat: No trismus Neck: No stridor.   Cardiovascular: Normal rate, regular rhythm. Grossly normal heart sounds.  Good peripheral circulation. Respiratory: Normal respiratory effort.  No retractions. Lungs CTAB and moving good air Gastrointestinal: Soft nondistended nontender no rebound or guarding no peritonitis no costovertebral tenderness Musculoskeletal: No lower extremity edema   Neurologic:  Normal speech and language. No gross focal neurologic deficits are appreciated. Skin:  Skin is warm, dry and intact. No rash noted. Psychiatric: Mood and affect are normal. Speech and behavior are normal.    ____________________________________________   DIFFERENTIAL includes but not limited to  Renal colic, pyelonephritis, urinary tract infection, bowel  obstruction ____________________________________________   LABS (all labs ordered are listed, but only abnormal results are displayed)  Labs Reviewed  URINALYSIS, COMPLETE (UACMP) WITH MICROSCOPIC - Abnormal; Notable for the following components:      Result Value   Color, Urine COLORLESS (*)    APPearance CLEAR (*)    Specific Gravity, Urine 1.003 (*)    Bacteria, UA RARE (*)    All other components within normal limits  BASIC METABOLIC PANEL - Abnormal; Notable for the following components:   Glucose, Bld 163 (*)    Creatinine, Ser 0.39 (*)    All other components within normal limits  CBC - Abnormal; Notable for the following components:   RBC 5.42 (*)    MCV 77.5 (*)    All other components within normal limits  HEPATIC FUNCTION PANEL  LIPASE, BLOOD    Lab work reviewed by me shows rare bacteria but otherwise unremarkable __________________________________________  EKG   ____________________________________________  RADIOLOGY  CT abdomen  pelvis reviewed by me shows large leiomyoma on the left ____________________________________________   PROCEDURES  Procedure(s) performed: no  Procedures  Critical Care performed: no  ____________________________________________   INITIAL IMPRESSION / ASSESSMENT AND PLAN / ED COURSE  Pertinent labs & imaging results that were available during my care of the patient were reviewed by me and considered in my medical decision making (see chart for details).   As part of my medical decision making, I reviewed the following data within the [place] History obtained from family if available, nursing notes, old chart and ekg, as well as notes from prior ED visits.  The patient comes the emergency department quite uncomfortable appearing.  8 mg of IV morphine along with 4 mg of IV Zofran given with some improvement in her symptoms.  Lab work is unremarkable but given her flank pain and abdominal tenderness sent  to CT scan shows no stone or infection but is consistent with a large fibroid.  On further questioning the patient says she knew about this however has not followed up with [facility] gynecology.  Given 15 more milligrams of ketorolac with near complete resolution of her symptoms.  I will give her a short course of ibuprofen and Percocet and refer her to [facility] gynecology as an outpatient.  Strict return precautions have been given.      ____________________________________________   FINAL CLINICAL IMPRESSION(S) / ED DIAGNOSES  Final diagnoses:  Flank pain  Uterine leiomyoma, unspecified location      NEW MEDICATIONS STARTED DURING THIS VISIT:  Discharge Medication List as of 09/05/2018  1:12 AM    START taking these medications   Details  ibuprofen (ADVIL,MOTRIN) 600 MG tablet Take 1 tablet (600 mg total) by mouth every 8 (eight) hours as needed., Starting Thu 09/05/2018, Print    oxyCODONE-acetaminophen (PERCOCET/ROXICET) 5-325 MG tablet Take 1 tablet by mouth every 4 (four) hours as needed for severe pain., Starting Thu 09/05/2018, Print         Note:  This document was prepared using Dragon voice recognition software and may include unintentional dictation errors.     Darel Hong, MD 09/09/18 [phone]

## 2018-09-05 ENCOUNTER — Emergency Department: Payer: Medicare HMO

## 2018-09-05 ENCOUNTER — Encounter: Payer: Self-pay | Admitting: Radiology

## 2018-09-05 DIAGNOSIS — D259 Leiomyoma of uterus, unspecified: Secondary | ICD-10-CM | POA: Diagnosis not present

## 2018-09-05 LAB — HEPATIC FUNCTION PANEL
ALBUMIN: 4.1 g/dL (ref 3.5–5.0)
ALT: 35 U/L (ref 0–44)
AST: 26 U/L (ref 15–41)
Alkaline Phosphatase: 86 U/L (ref 38–126)
BILIRUBIN TOTAL: 0.8 mg/dL (ref 0.3–1.2)
Bilirubin, Direct: 0.1 mg/dL (ref 0.0–0.2)
Indirect Bilirubin: 0.7 mg/dL (ref 0.3–0.9)
TOTAL PROTEIN: 7.4 g/dL (ref 6.5–8.1)

## 2018-09-05 LAB — LIPASE, BLOOD: LIPASE: 28 U/L (ref 11–51)

## 2018-09-05 MED ORDER — ONDANSETRON HCL 4 MG/2ML IJ SOLN
4.0000 mg | Freq: Once | INTRAMUSCULAR | Status: AC
  Administered 2018-09-05: 4 mg via INTRAVENOUS
  Filled 2018-09-05: qty 2

## 2018-09-05 MED ORDER — KETOROLAC TROMETHAMINE 30 MG/ML IJ SOLN
15.0000 mg | Freq: Once | INTRAMUSCULAR | Status: AC
  Administered 2018-09-05: 15 mg via INTRAVENOUS
  Filled 2018-09-05: qty 1

## 2018-09-05 MED ORDER — MORPHINE SULFATE (PF) 4 MG/ML IV SOLN
8.0000 mg | Freq: Once | INTRAVENOUS | Status: AC
  Administered 2018-09-05: 8 mg via INTRAVENOUS
  Filled 2018-09-05: qty 2

## 2018-09-05 MED ORDER — OXYCODONE-ACETAMINOPHEN 5-325 MG PO TABS: 1.0000 | ORAL_TABLET | ORAL | 0 refills | Status: DC | PRN

## 2018-09-05 MED ORDER — IOPAMIDOL (ISOVUE-300) INJECTION 61%
100.0000 mL | Freq: Once | INTRAVENOUS | Status: AC | PRN
  Administered 2018-09-05: 100 mL via INTRAVENOUS

## 2018-09-05 MED ORDER — IBUPROFEN 600 MG PO TABS: 600.0000 mg | ORAL_TABLET | Freq: Three times a day (TID) | ORAL | 0 refills | Status: DC | PRN

## 2018-09-05 NOTE — Discharge Instructions (Signed)
Fortunately today your lab work was reassuring.  Your CT scan today shows quite a large fibroid uterus and I think your pain is largely related to your fibroids.  Please make an appointment to establish care with the [facility] gynecologist to discuss her further options.  Return to the emergency department sooner for any concerns.  It was a pleasure to take care of you today, and thank you for coming to our emergency department.  If you have any questions or concerns before leaving please ask the nurse to grab me and I'm more than happy to go through your aftercare instructions again.  If you were prescribed any opioid pain medication today such as Norco, Vicodin, Percocet, morphine, hydrocodone, or oxycodone please make sure you do not drive when you are taking this medication as it can alter your ability to drive safely.  If you have any concerns once you are home that you are not improving or are in fact getting worse before you can make it to your follow-up appointment, please do not hesitate to call 911 and come back for further evaluation.  Darel Hong, MD  Results for orders placed or performed during the hospital encounter of 09/04/18  Urinalysis, Complete w Microscopic  Result Value Ref Range   Color, Urine COLORLESS (A) YELLOW   APPearance CLEAR (A) CLEAR   Specific Gravity, Urine 1.003 (L) 1.005 - 1.030   pH 6.0 5.0 - 8.0   Glucose, UA NEGATIVE NEGATIVE mg/dL   Hgb urine dipstick NEGATIVE NEGATIVE   Bilirubin Urine NEGATIVE NEGATIVE   Ketones, ur NEGATIVE NEGATIVE mg/dL   Protein, ur NEGATIVE NEGATIVE mg/dL   Nitrite NEGATIVE NEGATIVE   Leukocytes, UA NEGATIVE NEGATIVE   WBC, UA NONE SEEN 0 - 5 WBC/hpf   Bacteria, UA RARE (A) NONE SEEN   Squamous Epithelial / LPF 0-5 0 - 5   Mucus PRESENT   Basic metabolic panel  Result Value Ref Range   Sodium 137 135 - 145 mmol/L   Potassium 4.3 3.5 - 5.1 mmol/L   Chloride 103 98 - 111 mmol/L   CO2 26 22 - 32 mmol/L   Glucose, Bld 163 (H)  70 - 99 mg/dL   BUN 17 6 - 20 mg/dL   Creatinine, Ser 0.39 (L) 0.44 - 1.00 mg/dL   Calcium 9.5 8.9 - 10.3 mg/dL   GFR calc non Af Amer >60 >60 mL/min   GFR calc Af Amer >60 >60 mL/min   Anion gap 8 5 - 15  CBC  Result Value Ref Range   WBC 6.9 4.0 - 10.5 K/uL   RBC 5.42 (H) 3.87 - 5.11 MIL/uL   Hemoglobin 14.6 12.0 - 15.0 g/dL   HCT 42.0 36.0 - 46.0 %   MCV 77.5 (L) 80.0 - 100.0 fL   MCH 26.9 26.0 - 34.0 pg   MCHC 34.8 30.0 - 36.0 g/dL   RDW 12.4 11.5 - 15.5 %   Platelets 260 150 - 400 K/uL   nRBC 0.0 0.0 - 0.2 %  Hepatic function panel  Result Value Ref Range   Total Protein 7.4 6.5 - 8.1 g/dL   Albumin 4.1 3.5 - 5.0 g/dL   AST 26 15 - 41 U/L   ALT 35 0 - 44 U/L   Alkaline Phosphatase 86 38 - 126 U/L   Total Bilirubin 0.8 0.3 - 1.2 mg/dL   Bilirubin, Direct 0.1 0.0 - 0.2 mg/dL   Indirect Bilirubin 0.7 0.3 - 0.9 mg/dL  Lipase, blood  Result Value Ref Range   Lipase 28 11 - 51 U/L   Ct Abdomen Pelvis W Contrast  Result Date: 09/05/2018 CLINICAL DATA:  Left flank pain. EXAM: CT ABDOMEN AND PELVIS WITH CONTRAST TECHNIQUE: Multidetector CT imaging of the abdomen and pelvis was performed using the standard protocol following bolus administration of intravenous contrast. CONTRAST:  166mL ISOVUE-300 IOPAMIDOL (ISOVUE-300) INJECTION 61% COMPARISON:  CT abdomen pelvis 09/10/2017 FINDINGS: LOWER CHEST: There is no basilar pleural or apical pericardial effusion. HEPATOBILIARY: The hepatic contours and density are normal. There is no intra- or extrahepatic biliary dilatation. Status post cholecystectomy. PANCREAS: The pancreatic parenchymal contours are normal and there is no ductal dilatation. There is no peripancreatic fluid collection. SPLEEN: Normal. ADRENALS/URINARY TRACT: --Adrenal glands: Normal. --Right kidney/ureter: No hydronephrosis, nephroureterolithiasis, perinephric stranding or solid renal mass. --Left kidney/ureter: No hydronephrosis, nephroureterolithiasis, perinephric  stranding or solid renal mass. --Urinary bladder: Normal for degree of distention STOMACH/BOWEL: --Stomach/Duodenum: There is no hiatal hernia or other gastric abnormality. The duodenal course and caliber are normal. --Small bowel: No dilatation or inflammation. --Colon: No focal abnormality. --Appendix: Normal. VASCULAR/LYMPHATIC: Normal course and caliber of the major abdominal vessels. Decreased size of multiple subcentimeter retroperitoneal lymph nodes. REPRODUCTIVE: There is a large uterine fibroid that measures up to 7.2 cm and contains calcification. The enhancing component at the posterior aspect of the fibroid measures up to 5.9 cm, showing increased enhancement relative to the 06/19/2010 study. There is a 2 cm left ovarian cyst. Right ovary is normal. MUSCULOSKELETAL. No bony spinal canal stenosis or focal osseous abnormality. OTHER: None. IMPRESSION: 1. No obstructive uropathy or nephrolithiasis. 2. Redemonstration of large uterine fibroid located at the dorsal uterine corpus. Increased contrast enhancement of part of the fibroid compared to 94/85/4627 is of uncertain significance and may be secondary to vascular changes related to degeneration over time or differences in contrast phase timing between the 2 scans. Electronically Signed   By: Ulyses Jarred M.D.   On: 09/05/2018 00:32

## 2018-09-05 NOTE — ED Notes (Signed)
Pt states left flank pain of "6/10" that radiates to left stomach for the last couple of days.

## 2018-09-09 ENCOUNTER — Ambulatory Visit (INDEPENDENT_AMBULATORY_CARE_PROVIDER_SITE_OTHER): Payer: Medicare HMO | Admitting: Obstetrics & Gynecology

## 2018-09-09 ENCOUNTER — Encounter: Payer: Self-pay | Admitting: Obstetrics & Gynecology

## 2018-09-09 VITALS — BP 138/80 | Ht 62.0 in | Wt 183.0 lb

## 2018-09-09 DIAGNOSIS — N816 Rectocele: Secondary | ICD-10-CM

## 2018-09-09 DIAGNOSIS — R102 Pelvic and perineal pain: Secondary | ICD-10-CM

## 2018-09-09 DIAGNOSIS — D219 Benign neoplasm of connective and other soft tissue, unspecified: Secondary | ICD-10-CM

## 2018-09-09 DIAGNOSIS — Z23 Encounter for immunization: Secondary | ICD-10-CM

## 2018-09-09 NOTE — Patient Instructions (Addendum)
Total Laparoscopic Hysterectomy A total laparoscopic hysterectomy is a minimally invasive surgery to remove your uterus and cervix. This surgery is performed by making several small cuts (incisions) in your abdomen. It can also be done with a thin, lighted tube (laparoscope) inserted into two small incisions in your lower abdomen. Your fallopian tubes and ovaries can be removed (bilateral salpingo-oophorectomy) during this surgery as well.Benefits of minimally invasive surgery include:  Less pain.  Less risk of blood loss.  Less risk of infection.  Quicker return to normal activities.  Tell a health care provider about:  Any allergies you have.  All medicines you are taking, including vitamins, herbs, eye drops, creams, and over-the-counter medicines.  Any problems you or family members have had with anesthetic medicines.  Any blood disorders you have.  Any surgeries you have had.  Any medical conditions you have. What are the risks? Generally, this is a safe procedure. However, as with any procedure, complications can occur. Possible complications include:  Bleeding.  Blood clots in the legs or lung.  Infection.  Injury to surrounding organs.  Problems with anesthesia.  Early menopause symptoms (hot flashes, night sweats, insomnia).  Risk of conversion to an open abdominal incision.  What happens before the procedure?  Ask your health care provider about changing or stopping your regular medicines.  Do not take aspirin or blood thinners (anticoagulants) for 1 week before the surgery or as told by your health care provider.  Do not eat or drink anything for 8 hours before the surgery or as told by your health care provider.  Quit smoking if you smoke.  Arrange for a ride home after surgery and for someone to help you at home during recovery. What happens during the procedure?  You will be given antibiotic medicine.  An IV tube will be placed in your arm. You  will be given medicine to make you sleep (general anesthetic).  A gas (carbon dioxide) will be used to inflate your abdomen. This will allow your surgeon to look inside your abdomen, perform your surgery, and treat any other problems found if necessary.  Three or four small incisions (often less than 1/2 inch) will be made in your abdomen. One of these incisions will be made in the area of your belly button (navel). The laparoscope will be inserted into the incision. Your surgeon will look through the laparoscope while doing your procedure.  Other surgical instruments will be inserted through the other incisions.  Your uterus may be removed through your vagina or cut into small pieces and removed through the small incisions.  Your incisions will be closed. What happens after the procedure?  The gas will be released from inside your abdomen.  You will be taken to the recovery area where a nurse will watch and check your progress. Once you are awake, stable, and taking fluids well, without other problems, you will return to your room or be allowed to go home.  There is usually minimal discomfort following the surgery because the incisions are so small.  You will be given pain medicine while you are in the hospital and for when you go home.   Anterior and Posterior Colporrhaphy Anterior or posterior colporrhaphy is surgery to fix a prolapse of organs in the genital tract. Prolapse means the falling down, bulging, dropping, or drooping of an organ. Organs that commonly prolapse include the rectum, bladder, vagina, and uterus. Prolapse can affect a single organ or several organs at the same time. This  often worsens when women stop having their monthly periods (menopause) because estrogen loss weakens the muscles and tissues in the genital tract. In addition, prolapse happens when the organs are damaged or weakened. This commonly happens after childbirth and as a result of aging. Surgery is often  done for severe prolapses. The type of colporrhaphy done depends on the type of genital prolapse. Types of genital prolapse include the following:  Cystocele. This is a prolapse of the upper (anterior) wall of the vagina. The anterior wall bulges into the vagina and brings the bladder with it.  Rectocele. This is a prolapse of the lower (posterior) wall of the vagina. The posterior vaginal wall bulges into the vagina and brings the rectum with it.  Enterocele. This is a prolapse of part of the pelvic organs called the pouch of Douglas. It also involves a portion of the small bowel. It appears as a bulge under the neck of the uterus at the top of the back wall of the vagina.  Procidentia. This is a complete prolapse of the uterus and the cervix. The prolapse can be seen and felt coming out of the vagina.  LET [facility] CARE PROVIDER KNOW ABOUT:  Any allergies you have.  All medicines you are taking, including vitamins, herbs, eye drops, creams, and over-the-counter medicines.  Previous problems you or members of your family have had with the use of anesthetics.  Any blood disorders you have.  Previous surgeries you have had.  Medical conditions you have.  Smoking history or history of alcohol use.  Possibility of pregnancy, if this applies. RISKS AND COMPLICATIONS Generally, anterior or posterior colporrhaphy is a safe procedure. However, as with any procedure, complications can occur. Possible complications include:  Infection.  Damage to other organs during surgery.  Bleeding after surgery.  Problems urinating.  Problems from the anesthetic.  BEFORE THE PROCEDURE  Ask your health care provider about changing or stopping your regular medicines.  Do not eat or drink anything for at least 8 hours before the surgery.  If you smoke, do not smoke for at least 2 weeks before the surgery.  Make plans to have someone drive you home after your hospital stay. Also, arrange  for someone to help you with activities during recovery. PROCEDURE You may be given medicine to help you relax before the surgery (sedative). During the surgery you will be given medicine to make you sleep through the procedure (general anesthetic) or medicine to numb you from the waist down (spinal anesthetic). This medicine will be given through an intravenous (IV) access tube that is put into one of your veins. The procedure will vary depending on the type of repair:  Anterior repair. A cut (incision) is made in the midline section of the front part of the vaginal wall. A triangular-shaped piece of vaginal tissue is removed, and the stronger, healthier tissue is sewn together in order to support and suspend the bladder.  Posterior repair. An incision is made midline on the back wall of the vagina. A triangular portion of vaginal skin is removed to expose the muscle. Excess tissue is removed, and stronger, healthier muscle and ligament tissue is sewn together to support the rectum.  Anterior and posterior repair. Both procedures are done during the same surgery.  What to expect after the procedure You will be taken to a recovery area. Your blood pressure, pulse, breathing, and temperature (vital signs) will be monitored. This is done until you are stable. Then you will  be transferred to a hospital room. After surgery, you will have a small rubber tube in place to drain your bladder (urinary catheter). This will be in place for 2 to 7 days or until your bladder is working properly on its own. The IV access tube will be removed in 1 to 3 days. You may have a gauze packing in your vagina to prevent bleeding. This will be removed 2 or 3 days after the surgery. You will likely need to stay in the hospital for 3 to 5 days. This information is not intended to replace advice given to you by your health care provider. Make sure you discuss any questions you have with your health care provider. Document  Released: 02/03/2004 Document Revised: 04/20/2016 Document Reviewed: 04/04/2013 Elsevier Interactive Patient Education  2017 [employer].

## 2018-09-09 NOTE — Progress Notes (Signed)
Uterine Fibroids Patient presents with uterine fibroids. Pt has known about small fibroid several years ago, did not have surgery then even though she wanted to.  She more recently (ober the past year) has noted worsening pelvic pressure and low back pain that radiates to the suprapubic region.  Also feels vaginal bulge or pressure at times.  Pain is mild to moderate without other associated sx's.  Walking and sitting make it worse.  Nothing relieves pain. Periods are absent for one year (menopause). No intermenstrual bleeding, spotting, or discharge.    PMHx: She  has a past medical history of Diabetes mellitus without complication ([place]), Hypertension, Hyperthyroidism, Osteoarthritis, Pyelonephritis, Sciatica, and Urinary tract infection. Also,  has a past surgical history that includes cholecystecomy; Tubal ligation; and Cholecystectomy., family history includes CAD in her paternal grandmother; Diabetic kidney disease in her paternal grandmother; Heart failure in her mother.,  reports that she has never smoked. She has never used smokeless tobacco. She reports that she does not drink alcohol or use drugs.  She has a current medication list which includes the following prescription(s): albuterol, chlorpheniramine-hydrocodone, ciprofloxacin, cyclobenzaprine, gabapentin, ibuprofen, meclizine, methimazole, methocarbamol, methylprednisolone, metoprolol tartrate, oxycodone-acetaminophen, tramadol, and metformin. Also, has No Known Allergies.  Review of Systems  Constitutional: Negative for chills, fever and malaise/fatigue.  HENT: Negative for congestion, sinus pain and sore throat.   Eyes: Negative for blurred vision and pain.  Respiratory: Negative for cough and wheezing.   Cardiovascular: Negative for chest pain and leg swelling.  Gastrointestinal: Negative for abdominal pain, constipation, diarrhea, heartburn, nausea and vomiting.  Genitourinary: Negative for dysuria, frequency, hematuria and  urgency.  Musculoskeletal: Negative for back pain, joint pain, myalgias and neck pain.  Skin: Negative for itching and rash.  Neurological: Negative for dizziness, tremors and weakness.  Endo/Heme/Allergies: Does not bruise/bleed easily.  Psychiatric/Behavioral: Negative for depression. The patient is not nervous/anxious and does not have insomnia.    Objective: BP 138/80   Ht 5\' 2"  (1.575 m)   Wt 183 lb (83 kg)   LMP 06/21/2017   BMI 33.47 kg/m  Physical Exam  Constitutional: She is oriented to person, place, and time. She appears well-developed and well-nourished. No distress.  Genitourinary: Rectum normal and vagina normal. Pelvic exam was performed with patient supine. There is no rash or lesion on the right labia. There is no rash or lesion on the left labia. Vagina exhibits no lesion. No bleeding in the vagina. Right adnexum does not display mass and does not display tenderness. Left adnexum does not display mass and does not display tenderness. Cervix does not exhibit motion tenderness, lesion, friability or polyp.   Uterus is enlarged, tender, mobile and midaxial. Uterus is not exhibiting a mass.  Genitourinary Comments: Gr 2 rectocele, Min cystocele  HENT:  Head: Normocephalic and atraumatic. Head is without laceration.  Right Ear: Hearing normal.  Left Ear: Hearing normal.  Nose: No epistaxis.  No foreign bodies.  Mouth/Throat: Uvula is midline, oropharynx is clear and moist and mucous membranes are normal.  Eyes: Pupils are equal, round, and reactive to light.  Neck: Normal range of motion. Neck supple. No thyromegaly present.  Cardiovascular: Normal rate and regular rhythm. Exam reveals no gallop and no friction rub.  No murmur heard. Pulmonary/Chest: Effort normal and breath sounds normal. No respiratory distress. She has no wheezes.  Abdominal: Soft. Bowel sounds are normal. She exhibits no distension. There is no tenderness. There is no rebound.  Musculoskeletal: Normal  range of motion.  Neurological:  She is alert and oriented to person, place, and time. No cranial nerve deficit.  Skin: Skin is warm and dry.  Psychiatric: She has a normal mood and affect. Judgment normal.  Vitals reviewed.  ASSESSMENT/PLAN:   Problem List Items Addressed This Visit    Rectocele   Fibroid - Primary   Pelvic pain    Other Visit Diagnoses    Need for immunization against influenza       Relevant Orders   Flu Vaccine QUAD 36+ mos IM (Completed)    Fibroid treatment such as [place], Lupron, Myomectomy, and Hysterectomy discussed in detail, with the pros and cons of each choice counseled.  No treatment as an option also discussed, as well as control of symptoms alone with hormone therapy. Information provided to the patient.  Pt desires hysterectomy after full discussion.  Ovaries too.  Vaginal repair discussed and planned also.  Pt desires Oct 29th to schedule surgery.   Barnett Applebaum, MD, [facility], [facility] 09/09/2018  11:00 AM

## 2018-09-10 ENCOUNTER — Telehealth: Payer: Self-pay | Admitting: Obstetrics & Gynecology

## 2018-09-10 NOTE — Telephone Encounter (Signed)
-----   Message from Gae Dry, MD sent at 09/09/2018 10:56 AM EDT ----- Regarding: SURGERY Surgery Booking Request Patient Full Name:  Kristin Wilson  MRN: [id number]  DOB: [birth year]  Surgeon: Hoyt Koch, MD  Requested Surgery Date and Time: 09/24/18 Primary Diagnosis AND Code: Fibroid Uterus, Pelvic pressure, Rectocele Secondary Diagnosis and Code:  Surgical Procedure: TLH BSO ANTERIOR/POSTERIOR REPAIR CYSTOSCOPY L&D Notification: No Admission Status: same day surgery Length of Surgery: 1.5 hr Special Case Needs: no H&P: yes (date) Phone Interview???: yes Interpreter: Language:  Medical Clearance: no Special Scheduling Instructions: on

## 2018-09-10 NOTE — Telephone Encounter (Signed)
Patient is aware of H&P at [facility] on 09/18/18 @ 11:20am w/ Dr Kenton Kingfisher, [place] phone interview to be scheduled, and OR on 09/24/18. Patient is aware she may receive calls from the [place] [facility] and [facility]. Patient confirmed [facility], and secondary Medicaid.

## 2018-09-13 DIAGNOSIS — E0590 Thyrotoxicosis, unspecified without thyrotoxic crisis or storm: Secondary | ICD-10-CM | POA: Diagnosis not present

## 2018-09-13 DIAGNOSIS — I119 Hypertensive heart disease without heart failure: Secondary | ICD-10-CM | POA: Diagnosis not present

## 2018-09-13 DIAGNOSIS — K5792 Diverticulitis of intestine, part unspecified, without perforation or abscess without bleeding: Secondary | ICD-10-CM | POA: Diagnosis not present

## 2018-09-16 DIAGNOSIS — M545 Low back pain: Secondary | ICD-10-CM | POA: Diagnosis not present

## 2018-09-16 DIAGNOSIS — E0590 Thyrotoxicosis, unspecified without thyrotoxic crisis or storm: Secondary | ICD-10-CM | POA: Diagnosis not present

## 2018-09-16 DIAGNOSIS — E119 Type 2 diabetes mellitus without complications: Secondary | ICD-10-CM | POA: Diagnosis not present

## 2018-09-16 DIAGNOSIS — I10 Essential (primary) hypertension: Secondary | ICD-10-CM | POA: Diagnosis not present

## 2018-09-16 DIAGNOSIS — G8929 Other chronic pain: Secondary | ICD-10-CM | POA: Diagnosis not present

## 2018-09-18 ENCOUNTER — Encounter: Payer: Self-pay | Admitting: Obstetrics & Gynecology

## 2018-09-18 ENCOUNTER — Ambulatory Visit (INDEPENDENT_AMBULATORY_CARE_PROVIDER_SITE_OTHER): Payer: Medicare HMO | Admitting: Obstetrics & Gynecology

## 2018-09-18 VITALS — BP 138/80 | Ht 62.0 in | Wt 183.0 lb

## 2018-09-18 DIAGNOSIS — D219 Benign neoplasm of connective and other soft tissue, unspecified: Secondary | ICD-10-CM | POA: Diagnosis not present

## 2018-09-18 DIAGNOSIS — N816 Rectocele: Secondary | ICD-10-CM | POA: Diagnosis not present

## 2018-09-18 DIAGNOSIS — R102 Pelvic and perineal pain: Secondary | ICD-10-CM | POA: Diagnosis not present

## 2018-09-18 NOTE — H&P (View-Only) (Signed)
PRE-OPERATIVE HISTORY AND PHYSICAL EXAM  HPI:  Kristin Wilson is a 57 y.o. [id number] Patient's last menstrual period was 06/21/2017.; she is being admitted for surgery related to fibroids, pelvic pain and pelvic relaxation. Pt has known about small fibroid several years ago, did not have surgery then even though she wanted to.  She more recently (over the past year) has noted worsening pelvic pressure and low back pain that radiates to the suprapubic region.  Also feels vaginal bulge or pressure at times.  Pain is mild to moderate without other associated sx's.  Walking and sitting make it worse.  Nothing relieves pain. Periods are absent for one year (menopause). No intermenstrual bleeding, spotting, or discharge.  History of 7 cm fibroid by CT.  PMHx: Past Medical History:  Diagnosis Date  . Diabetes mellitus without complication ([place])   . Hypertension   . Hyperthyroidism   . Osteoarthritis   . Pyelonephritis   . Sciatica   . Urinary tract infection    Past Surgical History:  Procedure Laterality Date  . cholecystecomy    . CHOLECYSTECTOMY    . TUBAL LIGATION     Family History  Problem Relation Age of Onset  . Heart failure Mother   . Diabetic kidney disease Paternal Grandmother   . CAD Paternal Grandmother    Social History   Tobacco Use  . Smoking status: Never Smoker  . Smokeless tobacco: Never Used  Substance Use Topics  . Alcohol use: No  . Drug use: No    Current Outpatient Medications:  .  albuterol (PROVENTIL HFA;VENTOLIN HFA) 108 (90 Base) MCG/ACT inhaler, Inhale 2 puffs into the lungs every 4 (four) hours as needed for wheezing or shortness of breath., Disp: 1 Inhaler, Rfl: 0 .  chlorpheniramine-HYDROcodone (TUSSIONEX PENNKINETIC ER) 10-8 MG/5ML SUER, Take 5 mLs by mouth 2 (two) times daily., Disp: 70 mL, Rfl: 0 .  ciprofloxacin (CIPRO) 500 MG tablet, Take 1 tablet (500 mg total) by mouth 2 (two) times daily., Disp: 28 tablet, Rfl: 0 .  cyclobenzaprine  (FLEXERIL) 5 MG tablet, Take 1 tablet (5 mg total) by mouth 3 (three) times daily as needed for muscle spasms., Disp: 15 tablet, Rfl: 0 .  glipiZIDE (GLUCOTROL XL) 10 MG 24 hr tablet, Take 10 mg by mouth daily., Disp: , Rfl:  .  ibuprofen (ADVIL,MOTRIN) 600 MG tablet, Take 1 tablet (600 mg total) by mouth every 8 (eight) hours as needed., Disp: 30 tablet, Rfl: 0 .  ibuprofen (ADVIL,MOTRIN) 800 MG tablet, Take 800 mg by mouth every 8 (eight) hours as needed., Disp: , Rfl:  .  meclizine (ANTIVERT) 12.5 MG tablet, Take 1 tablet (12.5 mg total) by mouth 3 (three) times daily as needed for dizziness., Disp: 15 tablet, Rfl: 0 .  methimazole (TAPAZOLE) 10 MG tablet, Take 1 tablet (10 mg total) by mouth 2 (two) times daily., Disp: 60 tablet, Rfl: 0 .  methocarbamol (ROBAXIN) 500 MG tablet, Take 1 tablet (500 mg total) by mouth 4 (four) times daily., Disp: 16 tablet, Rfl: 0 .  methylPREDNISolone (MEDROL DOSEPAK) 4 MG TBPK tablet, Take as directed, Disp: 21 tablet, Rfl: 0 .  metoprolol tartrate (LOPRESSOR) 25 MG tablet, Take 1 tablet (25 mg total) by mouth 2 (two) times daily., Disp: 60 tablet, Rfl: 0 .  traMADol (ULTRAM) 50 MG tablet, Take 1 tablet (50 mg total) by mouth every 6 (six) hours as needed., Disp: 12 tablet, Rfl: 0 .  metFORMIN (GLUCOPHAGE) 500 MG tablet,  Take 500 mg by mouth 2 (two) times daily., Disp: , Rfl:  .  oxyCODONE-acetaminophen (PERCOCET/ROXICET) 5-325 MG tablet, Take 1 tablet by mouth every 4 (four) hours as needed for severe pain. (Patient not taking: Reported on 09/12/2018), Disp: 7 tablet, Rfl: 0 Allergies: Patient has no known allergies.  Review of Systems  Constitutional: Negative for chills, fever and malaise/fatigue.  HENT: Negative for congestion, sinus pain and sore throat.   Eyes: Negative for blurred vision and pain.  Respiratory: Negative for cough and wheezing.   Cardiovascular: Negative for chest pain and leg swelling.  Gastrointestinal: Negative for abdominal pain,  constipation, diarrhea, heartburn, nausea and vomiting.  Genitourinary: Negative for dysuria, frequency, hematuria and urgency.  Musculoskeletal: Negative for back pain, joint pain, myalgias and neck pain.  Skin: Negative for itching and rash.  Neurological: Negative for dizziness, tremors and weakness.  Endo/Heme/Allergies: Does not bruise/bleed easily.  Psychiatric/Behavioral: Negative for depression. The patient is not nervous/anxious and does not have insomnia.     Objective: BP 138/80   Ht 5\' 2"  (1.575 m)   Wt 183 lb (83 kg)   LMP 06/21/2017   BMI 33.47 kg/m   Filed Weights   09/18/18 1125  Weight: 183 lb (83 kg)   Physical Exam  Constitutional: She is oriented to person, place, and time. She appears well-developed and well-nourished. No distress.  HENT:  Head: Normocephalic and atraumatic. Head is without laceration.  Right Ear: Hearing normal.  Left Ear: Hearing normal.  Nose: No epistaxis.  No foreign bodies.  Mouth/Throat: Uvula is midline, oropharynx is clear and moist and mucous membranes are normal.  Eyes: Pupils are equal, round, and reactive to light.  Neck: Normal range of motion. Neck supple. No thyromegaly present.  Cardiovascular: Normal rate and regular rhythm. Exam reveals no gallop and no friction rub.  No murmur heard. Pulmonary/Chest: Effort normal and breath sounds normal. No respiratory distress. She has no wheezes. Right breast exhibits no mass, no skin change and no tenderness. Left breast exhibits no mass, no skin change and no tenderness.  Abdominal: Soft. Bowel sounds are normal. She exhibits no distension. There is no tenderness. There is no rebound.  Musculoskeletal: Normal range of motion.  Neurological: She is alert and oriented to person, place, and time. No cranial nerve deficit.  Skin: Skin is warm and dry.  Psychiatric: She has a normal mood and affect. Judgment normal.  Vitals reviewed.  Assessment: 1. Rectocele   2. Fibroid   3.  Pelvic pain   TLH BSO and Ant post repair scheduled and discussed  I have had a careful discussion with this patient about all the options available and the risk/benefits of each. I have fully informed this patient that surgery may subject her to a variety of discomforts and risks: She understands that most patients have surgery with little difficulty, but problems can happen ranging from minor to fatal. These include nausea, vomiting, pain, bleeding, infection, poor healing, hernia, or formation of adhesions. Unexpected reactions may occur from any drug or anesthetic given. Unintended injury may occur to other pelvic or abdominal structures such as Fallopian tubes, ovaries, bladder, ureter (tube from kidney to bladder), or bowel. Nerves going from the pelvis to the legs may be injured. Any such injury may require immediate or later additional surgery to correct the problem. Excessive blood loss requiring transfusion is very unlikely but possible. Dangerous blood clots may form in the legs or lungs. Physical and sexual activity will be restricted in varying  degrees for an indeterminate period of time but most often 2-6 weeks.  Finally, she understands that it is impossible to list every possible undesirable effect and that the condition for which surgery is done is not always cured or significantly improved, and in rare cases may be even worse.Ample time was given to answer all questions.  Barnett Applebaum, MD, [facility], [facility] 09/18/2018  11:36 AM

## 2018-09-18 NOTE — Patient Instructions (Signed)

## 2018-09-18 NOTE — Progress Notes (Signed)
PRE-OPERATIVE HISTORY AND PHYSICAL EXAM  HPI:  Kristin Wilson is a 57 y.o. [id number] Patient's last menstrual period was 06/21/2017.; she is being admitted for surgery related to fibroids, pelvic pain and pelvic relaxation. Pt has known about small fibroid several years ago, did not have surgery then even though she wanted to.  She more recently (over the past year) has noted worsening pelvic pressure and low back pain that radiates to the suprapubic region.  Also feels vaginal bulge or pressure at times.  Pain is mild to moderate without other associated sx's.  Walking and sitting make it worse.  Nothing relieves pain. Periods are absent for one year (menopause). No intermenstrual bleeding, spotting, or discharge.  History of 7 cm fibroid by CT.  PMHx: Past Medical History:  Diagnosis Date  . Diabetes mellitus without complication ([place])   . Hypertension   . Hyperthyroidism   . Osteoarthritis   . Pyelonephritis   . Sciatica   . Urinary tract infection    Past Surgical History:  Procedure Laterality Date  . cholecystecomy    . CHOLECYSTECTOMY    . TUBAL LIGATION     Family History  Problem Relation Age of Onset  . Heart failure Mother   . Diabetic kidney disease Paternal Grandmother   . CAD Paternal Grandmother    Social History   Tobacco Use  . Smoking status: Never Smoker  . Smokeless tobacco: Never Used  Substance Use Topics  . Alcohol use: No  . Drug use: No    Current Outpatient Medications:  .  albuterol (PROVENTIL HFA;VENTOLIN HFA) 108 (90 Base) MCG/ACT inhaler, Inhale 2 puffs into the lungs every 4 (four) hours as needed for wheezing or shortness of breath., Disp: 1 Inhaler, Rfl: 0 .  chlorpheniramine-HYDROcodone (TUSSIONEX PENNKINETIC ER) 10-8 MG/5ML SUER, Take 5 mLs by mouth 2 (two) times daily., Disp: 70 mL, Rfl: 0 .  ciprofloxacin (CIPRO) 500 MG tablet, Take 1 tablet (500 mg total) by mouth 2 (two) times daily., Disp: 28 tablet, Rfl: 0 .  cyclobenzaprine  (FLEXERIL) 5 MG tablet, Take 1 tablet (5 mg total) by mouth 3 (three) times daily as needed for muscle spasms., Disp: 15 tablet, Rfl: 0 .  glipiZIDE (GLUCOTROL XL) 10 MG 24 hr tablet, Take 10 mg by mouth daily., Disp: , Rfl:  .  ibuprofen (ADVIL,MOTRIN) 600 MG tablet, Take 1 tablet (600 mg total) by mouth every 8 (eight) hours as needed., Disp: 30 tablet, Rfl: 0 .  ibuprofen (ADVIL,MOTRIN) 800 MG tablet, Take 800 mg by mouth every 8 (eight) hours as needed., Disp: , Rfl:  .  meclizine (ANTIVERT) 12.5 MG tablet, Take 1 tablet (12.5 mg total) by mouth 3 (three) times daily as needed for dizziness., Disp: 15 tablet, Rfl: 0 .  methimazole (TAPAZOLE) 10 MG tablet, Take 1 tablet (10 mg total) by mouth 2 (two) times daily., Disp: 60 tablet, Rfl: 0 .  methocarbamol (ROBAXIN) 500 MG tablet, Take 1 tablet (500 mg total) by mouth 4 (four) times daily., Disp: 16 tablet, Rfl: 0 .  methylPREDNISolone (MEDROL DOSEPAK) 4 MG TBPK tablet, Take as directed, Disp: 21 tablet, Rfl: 0 .  metoprolol tartrate (LOPRESSOR) 25 MG tablet, Take 1 tablet (25 mg total) by mouth 2 (two) times daily., Disp: 60 tablet, Rfl: 0 .  traMADol (ULTRAM) 50 MG tablet, Take 1 tablet (50 mg total) by mouth every 6 (six) hours as needed., Disp: 12 tablet, Rfl: 0 .  metFORMIN (GLUCOPHAGE) 500 MG tablet,  Take 500 mg by mouth 2 (two) times daily., Disp: , Rfl:  .  oxyCODONE-acetaminophen (PERCOCET/ROXICET) 5-325 MG tablet, Take 1 tablet by mouth every 4 (four) hours as needed for severe pain. (Patient not taking: Reported on 09/12/2018), Disp: 7 tablet, Rfl: 0 Allergies: Patient has no known allergies.  Review of Systems  Constitutional: Negative for chills, fever and malaise/fatigue.  HENT: Negative for congestion, sinus pain and sore throat.   Eyes: Negative for blurred vision and pain.  Respiratory: Negative for cough and wheezing.   Cardiovascular: Negative for chest pain and leg swelling.  Gastrointestinal: Negative for abdominal pain,  constipation, diarrhea, heartburn, nausea and vomiting.  Genitourinary: Negative for dysuria, frequency, hematuria and urgency.  Musculoskeletal: Negative for back pain, joint pain, myalgias and neck pain.  Skin: Negative for itching and rash.  Neurological: Negative for dizziness, tremors and weakness.  Endo/Heme/Allergies: Does not bruise/bleed easily.  Psychiatric/Behavioral: Negative for depression. The patient is not nervous/anxious and does not have insomnia.     Objective: BP 138/80   Ht 5\' 2"  (1.575 m)   Wt 183 lb (83 kg)   LMP 06/21/2017   BMI 33.47 kg/m   Filed Weights   09/18/18 1125  Weight: 183 lb (83 kg)   Physical Exam  Constitutional: She is oriented to person, place, and time. She appears well-developed and well-nourished. No distress.  HENT:  Head: Normocephalic and atraumatic. Head is without laceration.  Right Ear: Hearing normal.  Left Ear: Hearing normal.  Nose: No epistaxis.  No foreign bodies.  Mouth/Throat: Uvula is midline, oropharynx is clear and moist and mucous membranes are normal.  Eyes: Pupils are equal, round, and reactive to light.  Neck: Normal range of motion. Neck supple. No thyromegaly present.  Cardiovascular: Normal rate and regular rhythm. Exam reveals no gallop and no friction rub.  No murmur heard. Pulmonary/Chest: Effort normal and breath sounds normal. No respiratory distress. She has no wheezes. Right breast exhibits no mass, no skin change and no tenderness. Left breast exhibits no mass, no skin change and no tenderness.  Abdominal: Soft. Bowel sounds are normal. She exhibits no distension. There is no tenderness. There is no rebound.  Musculoskeletal: Normal range of motion.  Neurological: She is alert and oriented to person, place, and time. No cranial nerve deficit.  Skin: Skin is warm and dry.  Psychiatric: She has a normal mood and affect. Judgment normal.  Vitals reviewed.  Assessment: 1. Rectocele   2. Fibroid   3.  Pelvic pain   TLH BSO and Ant post repair scheduled and discussed  I have had a careful discussion with this patient about all the options available and the risk/benefits of each. I have fully informed this patient that surgery may subject her to a variety of discomforts and risks: She understands that most patients have surgery with little difficulty, but problems can happen ranging from minor to fatal. These include nausea, vomiting, pain, bleeding, infection, poor healing, hernia, or formation of adhesions. Unexpected reactions may occur from any drug or anesthetic given. Unintended injury may occur to other pelvic or abdominal structures such as Fallopian tubes, ovaries, bladder, ureter (tube from kidney to bladder), or bowel. Nerves going from the pelvis to the legs may be injured. Any such injury may require immediate or later additional surgery to correct the problem. Excessive blood loss requiring transfusion is very unlikely but possible. Dangerous blood clots may form in the legs or lungs. Physical and sexual activity will be restricted in varying  degrees for an indeterminate period of time but most often 2-6 weeks.  Finally, she understands that it is impossible to list every possible undesirable effect and that the condition for which surgery is done is not always cured or significantly improved, and in rare cases may be even worse.Ample time was given to answer all questions.  Barnett Applebaum, MD, [facility], [facility] 09/18/2018  11:36 AM

## 2018-09-19 ENCOUNTER — Encounter
Admission: RE | Admit: 2018-09-19 | Discharge: 2018-09-19 | Disposition: A | Discharge status: Home / Self Care | Payer: Medicare HMO | Source: Ambulatory Visit | Attending: Obstetrics & Gynecology | Admitting: Obstetrics & Gynecology

## 2018-09-19 ENCOUNTER — Other Ambulatory Visit: Payer: Self-pay

## 2018-09-19 HISTORY — DX: Bronchitis, not specified as acute or chronic: J40

## 2018-09-19 NOTE — Patient Instructions (Signed)
Your procedure is scheduled on: 09-24-18 TUESDAY Report to Same Day Surgery 2nd floor medical mall [facility] Entrance-take elevator on left to 2nd floor.  Check in with surgery information desk.) To find out your arrival time please call [phone] between 1PM - 3PM on 09-23-18 MONDAY  Remember: Instructions that are not followed completely may result in serious medical risk, up to and including death, or upon the discretion of your surgeon and anesthesiologist your surgery may need to be rescheduled.    _x___ 1. Do not eat food after midnight the night before your procedure. You may drink WATER up to 2 hours before you are scheduled to arrive at the hospital for your procedure.  Do not drink WATER within 2 hours of your scheduled arrival to the hospital.  Type 1 and type 2 diabetics should only drink water.   ____Ensure clear carbohydrate drink on the way to the hospital for bariatric patients  ____Ensure clear carbohydrate drink 3 hours before surgery for Dr Dwyane Luo patients if physician instructed.    __x__ 2. No Alcohol for 24 hours before or after surgery.   __x__3. No Smoking or e-cigarettes for 24 prior to surgery.  Do not use any chewable tobacco products for at least 6 hour prior to surgery   ____  4. Bring all medications with you on the day of surgery if instructed.    __x__ 5. Notify your doctor if there is any change in your medical condition     (cold, fever, infections).    x___6. On the morning of surgery brush your teeth with toothpaste and water.  You may rinse your mouth with mouth wash if you wish.  Do not swallow any toothpaste or mouthwash.   Do not wear jewelry, make-up, hairpins, clips or nail polish.  Do not wear lotions, powders, or perfumes. You may wear deodorant.  Do not shave 48 hours prior to surgery. Men may shave face and neck.  Do not bring valuables to the hospital.    [facility] is not responsible for any belongings or valuables.           Contacts, dentures or bridgework may not be worn into surgery.  Leave your suitcase in the car. After surgery it may be brought to your room.  For patients admitted to the hospital, discharge time is determined by your treatment team.  _  Patients discharged the day of surgery will not be allowed to drive home.  You will need someone to drive you home and stay with you the night of your procedure.    Please read over the following fact sheets that you were given:   [facility] Preparing for Surgery  _x___ TAKE THE FOLLOWING MEDICATION THE MORNING OF SURGERY WITH A SMALL SIP OF WATER. These include:  1. METOPROLOL  2. METHIMAZOLE  3. YOU MAY TAKE PERCOCET DAY OF SURGERY IF NEEDED  4.  5.  6.  ____Fleets enema or Magnesium Citrate as directed.   _x___ Use CHG Soap or sage wipes as directed on instruction sheet   _X___ Use inhalers on the day of surgery and bring to hospital day of surgery-BRING YOUR ALBUTEROL [place]  _X___ Stop Metformin 2 days prior to surgery-LAST DOSE ON Saturday October 26 [place]   ____ Take 1/2 of usual insulin dose the night before surgery and none on the morning surgery.   ____ Follow recommendations from Cardiologist, Pulmonologist or PCP regarding stopping Aspirin, Coumadin, Plavix ,Eliquis, Effient, or Pradaxa,  and Pletal.  X____Stop Anti-inflammatories such as Advil, Aleve, Ibuprofen, Motrin, Naproxen, Naprosyn, Goodies powders or aspirin products NOW-OK to take Tylenol, TRAMADOL OR PERCOCET IF NEEDED   ____ Stop supplements until after surgery.    ____ Bring C-Pap to the hospital.

## 2018-09-23 ENCOUNTER — Telehealth: Payer: Self-pay | Admitting: Obstetrics & Gynecology

## 2018-09-23 ENCOUNTER — Encounter
Admission: RE | Admit: 2018-09-23 | Discharge: 2018-09-23 | Disposition: A | Discharge status: Home / Self Care | Payer: Medicare HMO | Source: Ambulatory Visit | Attending: Obstetrics & Gynecology | Admitting: Obstetrics & Gynecology

## 2018-09-23 DIAGNOSIS — D259 Leiomyoma of uterus, unspecified: Secondary | ICD-10-CM | POA: Diagnosis present

## 2018-09-23 DIAGNOSIS — E0590 Thyrotoxicosis, unspecified without thyrotoxic crisis or storm: Secondary | ICD-10-CM | POA: Diagnosis not present

## 2018-09-23 DIAGNOSIS — D251 Intramural leiomyoma of uterus: Secondary | ICD-10-CM | POA: Diagnosis not present

## 2018-09-23 DIAGNOSIS — Z79899 Other long term (current) drug therapy: Secondary | ICD-10-CM | POA: Diagnosis not present

## 2018-09-23 DIAGNOSIS — M1990 Unspecified osteoarthritis, unspecified site: Secondary | ICD-10-CM | POA: Diagnosis not present

## 2018-09-23 DIAGNOSIS — Z7984 Long term (current) use of oral hypoglycemic drugs: Secondary | ICD-10-CM | POA: Diagnosis not present

## 2018-09-23 DIAGNOSIS — R102 Pelvic and perineal pain: Secondary | ICD-10-CM | POA: Diagnosis present

## 2018-09-23 DIAGNOSIS — N72 Inflammatory disease of cervix uteri: Secondary | ICD-10-CM | POA: Diagnosis not present

## 2018-09-23 DIAGNOSIS — Z01818 Encounter for other preprocedural examination: Secondary | ICD-10-CM

## 2018-09-23 DIAGNOSIS — N8301 Follicular cyst of right ovary: Secondary | ICD-10-CM | POA: Diagnosis not present

## 2018-09-23 DIAGNOSIS — N888 Other specified noninflammatory disorders of cervix uteri: Secondary | ICD-10-CM | POA: Diagnosis not present

## 2018-09-23 DIAGNOSIS — E119 Type 2 diabetes mellitus without complications: Secondary | ICD-10-CM | POA: Diagnosis not present

## 2018-09-23 DIAGNOSIS — D250 Submucous leiomyoma of uterus: Secondary | ICD-10-CM | POA: Diagnosis not present

## 2018-09-23 DIAGNOSIS — I10 Essential (primary) hypertension: Secondary | ICD-10-CM

## 2018-09-23 DIAGNOSIS — N736 Female pelvic peritoneal adhesions (postinfective): Secondary | ICD-10-CM | POA: Diagnosis not present

## 2018-09-23 DIAGNOSIS — N816 Rectocele: Secondary | ICD-10-CM | POA: Diagnosis not present

## 2018-09-23 DIAGNOSIS — N8302 Follicular cyst of left ovary: Secondary | ICD-10-CM | POA: Diagnosis not present

## 2018-09-23 LAB — APTT: APTT: 29 s (ref 24–36)

## 2018-09-23 LAB — CBC
HCT: 43.2 % (ref 36.0–46.0)
Hemoglobin: 15 g/dL (ref 12.0–15.0)
MCH: 26.8 pg (ref 26.0–34.0)
MCHC: 34.7 g/dL (ref 30.0–36.0)
MCV: 77.1 fL — ABNORMAL LOW (ref 80.0–100.0)
PLATELETS: 244 10*3/uL (ref 150–400)
RBC: 5.6 MIL/uL — AB (ref 3.87–5.11)
RDW: 12.9 % (ref 11.5–15.5)
WBC: 5.3 10*3/uL (ref 4.0–10.5)
nRBC: 0 % (ref 0.0–0.2)

## 2018-09-23 LAB — TYPE AND SCREEN
ABO/RH(D): A NEG
Antibody Screen: NEGATIVE

## 2018-09-23 LAB — PROTIME-INR
INR: 1.1
PROTHROMBIN TIME: 14.1 s (ref 11.4–15.2)

## 2018-09-23 MED ORDER — SODIUM CHLORIDE 0.9 % IV SOLN
2.0000 g | INTRAVENOUS | Status: AC
  Administered 2018-09-24: 2 g via INTRAVENOUS

## 2018-09-23 NOTE — Telephone Encounter (Signed)
Pt aware.

## 2018-09-23 NOTE — Telephone Encounter (Signed)
Patient is schedule tomorrow for surgery with [facility]. Patient reports vaginal bleeding soaking a pad every 30 minutes. Please advise.

## 2018-09-23 NOTE — Telephone Encounter (Signed)
error 

## 2018-09-23 NOTE — Telephone Encounter (Signed)
OK to still have surgery as this will fix her bleeding problem. No other interventions or medicines today.

## 2018-09-24 ENCOUNTER — Ambulatory Visit: Payer: Medicare HMO | Admitting: Anesthesiology

## 2018-09-24 ENCOUNTER — Ambulatory Visit
Admission: RE | Admit: 2018-09-24 | Discharge: 2018-09-24 | Disposition: A | Discharge status: Home / Self Care | Payer: Medicare HMO | Source: Ambulatory Visit | Attending: Obstetrics & Gynecology | Admitting: Obstetrics & Gynecology

## 2018-09-24 ENCOUNTER — Encounter
Admission: RE | Disposition: A | Discharge status: Home / Self Care | Payer: Self-pay | Source: Ambulatory Visit | Attending: Obstetrics & Gynecology

## 2018-09-24 ENCOUNTER — Other Ambulatory Visit: Payer: Self-pay

## 2018-09-24 DIAGNOSIS — E0590 Thyrotoxicosis, unspecified without thyrotoxic crisis or storm: Secondary | ICD-10-CM | POA: Diagnosis not present

## 2018-09-24 DIAGNOSIS — I10 Essential (primary) hypertension: Secondary | ICD-10-CM | POA: Insufficient documentation

## 2018-09-24 DIAGNOSIS — N72 Inflammatory disease of cervix uteri: Secondary | ICD-10-CM | POA: Insufficient documentation

## 2018-09-24 DIAGNOSIS — N816 Rectocele: Secondary | ICD-10-CM

## 2018-09-24 DIAGNOSIS — N736 Female pelvic peritoneal adhesions (postinfective): Secondary | ICD-10-CM | POA: Diagnosis not present

## 2018-09-24 DIAGNOSIS — R102 Pelvic and perineal pain: Secondary | ICD-10-CM | POA: Diagnosis not present

## 2018-09-24 DIAGNOSIS — D250 Submucous leiomyoma of uterus: Secondary | ICD-10-CM | POA: Insufficient documentation

## 2018-09-24 DIAGNOSIS — R3989 Other symptoms and signs involving the genitourinary system: Secondary | ICD-10-CM | POA: Diagnosis not present

## 2018-09-24 DIAGNOSIS — E119 Type 2 diabetes mellitus without complications: Secondary | ICD-10-CM | POA: Insufficient documentation

## 2018-09-24 DIAGNOSIS — N8301 Follicular cyst of right ovary: Secondary | ICD-10-CM | POA: Diagnosis not present

## 2018-09-24 DIAGNOSIS — N888 Other specified noninflammatory disorders of cervix uteri: Secondary | ICD-10-CM | POA: Diagnosis not present

## 2018-09-24 DIAGNOSIS — Z7984 Long term (current) use of oral hypoglycemic drugs: Secondary | ICD-10-CM | POA: Insufficient documentation

## 2018-09-24 DIAGNOSIS — D259 Leiomyoma of uterus, unspecified: Secondary | ICD-10-CM | POA: Diagnosis not present

## 2018-09-24 DIAGNOSIS — Z79899 Other long term (current) drug therapy: Secondary | ICD-10-CM | POA: Insufficient documentation

## 2018-09-24 DIAGNOSIS — M1990 Unspecified osteoarthritis, unspecified site: Secondary | ICD-10-CM | POA: Insufficient documentation

## 2018-09-24 DIAGNOSIS — D251 Intramural leiomyoma of uterus: Secondary | ICD-10-CM | POA: Insufficient documentation

## 2018-09-24 DIAGNOSIS — N8302 Follicular cyst of left ovary: Secondary | ICD-10-CM | POA: Diagnosis not present

## 2018-09-24 DIAGNOSIS — D219 Benign neoplasm of connective and other soft tissue, unspecified: Secondary | ICD-10-CM | POA: Diagnosis present

## 2018-09-24 HISTORY — PX: CYSTOSCOPY: SHX5120

## 2018-09-24 HISTORY — PX: LAPAROSCOPIC HYSTERECTOMY: SHX1926

## 2018-09-24 HISTORY — PX: ANTERIOR AND POSTERIOR REPAIR WITH SACROSPINOUS FIXATION: SHX6536

## 2018-09-24 LAB — GLUCOSE, CAPILLARY
Glucose-Capillary: 205 mg/dL — ABNORMAL HIGH (ref 70–99)
Glucose-Capillary: 205 mg/dL — ABNORMAL HIGH (ref 70–99)

## 2018-09-24 LAB — POCT PREGNANCY, URINE: Preg Test, Ur: NEGATIVE

## 2018-09-24 LAB — ABO/RH: ABO/RH(D): A NEG

## 2018-09-24 SURGERY — HYSTERECTOMY, TOTAL, LAPAROSCOPIC
Anesthesia: General | Site: Vagina

## 2018-09-24 MED ORDER — FENTANYL CITRATE (PF) 100 MCG/2ML IJ SOLN
25.0000 ug | INTRAMUSCULAR | Status: DC | PRN
  Administered 2018-09-24: 25 ug via INTRAVENOUS

## 2018-09-24 MED ORDER — ONDANSETRON HCL 4 MG/2ML IJ SOLN
INTRAMUSCULAR | Status: DC | PRN
  Administered 2018-09-24: 4 mg via INTRAVENOUS

## 2018-09-24 MED ORDER — FENTANYL CITRATE (PF) 100 MCG/2ML IJ SOLN
INTRAMUSCULAR | Status: AC
  Administered 2018-09-24: 25 ug via INTRAVENOUS
  Filled 2018-09-24: qty 2

## 2018-09-24 MED ORDER — FENTANYL CITRATE (PF) 100 MCG/2ML IJ SOLN
INTRAMUSCULAR | Status: DC | PRN
  Administered 2018-09-24: 50 ug via INTRAVENOUS
  Administered 2018-09-24: 100 ug via INTRAVENOUS
  Administered 2018-09-24: 25 ug via INTRAVENOUS
  Administered 2018-09-24: 50 ug via INTRAVENOUS
  Administered 2018-09-24: 25 ug via INTRAVENOUS

## 2018-09-24 MED ORDER — DEXAMETHASONE SODIUM PHOSPHATE 10 MG/ML IJ SOLN
INTRAMUSCULAR | Status: DC | PRN
  Administered 2018-09-24: 8 mg via INTRAVENOUS

## 2018-09-24 MED ORDER — LACTATED RINGERS IV SOLN: INTRAVENOUS | Status: DC

## 2018-09-24 MED ORDER — DEXAMETHASONE SODIUM PHOSPHATE 10 MG/ML IJ SOLN
INTRAMUSCULAR | Status: AC
  Filled 2018-09-24: qty 1

## 2018-09-24 MED ORDER — PROPOFOL 10 MG/ML IV BOLUS
INTRAVENOUS | Status: DC | PRN
  Administered 2018-09-24: 150 mg via INTRAVENOUS

## 2018-09-24 MED ORDER — ACETAMINOPHEN NICU IV SYRINGE 10 MG/ML
INTRAVENOUS | Status: AC
  Filled 2018-09-24: qty 1

## 2018-09-24 MED ORDER — FAMOTIDINE 20 MG PO TABS
ORAL_TABLET | ORAL | Status: AC
  Filled 2018-09-24: qty 1

## 2018-09-24 MED ORDER — SODIUM CHLORIDE 0.9 % IV SOLN
INTRAVENOUS | Status: DC
  Administered 2018-09-24 (×2): via INTRAVENOUS

## 2018-09-24 MED ORDER — ROCURONIUM BROMIDE 100 MG/10ML IV SOLN
INTRAVENOUS | Status: DC | PRN
  Administered 2018-09-24: 50 mg via INTRAVENOUS
  Administered 2018-09-24: 15 mg via INTRAVENOUS
  Administered 2018-09-24 (×2): 10 mg via INTRAVENOUS

## 2018-09-24 MED ORDER — PROPOFOL 10 MG/ML IV BOLUS
INTRAVENOUS | Status: AC
  Filled 2018-09-24: qty 20

## 2018-09-24 MED ORDER — OXYCODONE HCL 5 MG PO TABS
5.0000 mg | ORAL_TABLET | Freq: Once | ORAL | Status: AC | PRN
  Administered 2018-09-24: 5 mg via ORAL

## 2018-09-24 MED ORDER — ACETAMINOPHEN 10 MG/ML IV SOLN
INTRAVENOUS | Status: DC | PRN
  Administered 2018-09-24: 1000 mg via INTRAVENOUS

## 2018-09-24 MED ORDER — ACETAMINOPHEN 325 MG PO TABS: 650.0000 mg | ORAL_TABLET | ORAL | Status: DC | PRN

## 2018-09-24 MED ORDER — PHENYLEPHRINE HCL 10 MG/ML IJ SOLN
INTRAMUSCULAR | Status: AC
  Filled 2018-09-24: qty 1

## 2018-09-24 MED ORDER — SODIUM CHLORIDE 0.9 % IV SOLN
INTRAVENOUS | Status: AC
  Filled 2018-09-24: qty 2

## 2018-09-24 MED ORDER — PHENYLEPHRINE HCL 10 MG/ML IJ SOLN
INTRAMUSCULAR | Status: DC | PRN
  Administered 2018-09-24 (×8): 100 ug via INTRAVENOUS

## 2018-09-24 MED ORDER — LIDOCAINE HCL (CARDIAC) PF 100 MG/5ML IV SOSY
PREFILLED_SYRINGE | INTRAVENOUS | Status: DC | PRN
  Administered 2018-09-24: 80 mg via INTRAVENOUS

## 2018-09-24 MED ORDER — MEPERIDINE HCL 50 MG/ML IJ SOLN: 6.2500 mg | INTRAMUSCULAR | Status: DC | PRN

## 2018-09-24 MED ORDER — MORPHINE SULFATE (PF) 4 MG/ML IV SOLN: 1.0000 mg | INTRAVENOUS | Status: DC | PRN

## 2018-09-24 MED ORDER — SUGAMMADEX SODIUM 500 MG/5ML IV SOLN
INTRAVENOUS | Status: DC | PRN
  Administered 2018-09-24: 160 mg via INTRAVENOUS

## 2018-09-24 MED ORDER — ACETAMINOPHEN 650 MG RE SUPP
650.0000 mg | RECTAL | Status: DC | PRN
  Filled 2018-09-24: qty 1

## 2018-09-24 MED ORDER — LIDOCAINE-EPINEPHRINE 1 %-1:100000 IJ SOLN
INTRAMUSCULAR | Status: DC | PRN
  Administered 2018-09-24: 14 mL

## 2018-09-24 MED ORDER — KETOROLAC TROMETHAMINE 30 MG/ML IJ SOLN: 30.0000 mg | Freq: Four times a day (QID) | INTRAMUSCULAR | Status: DC

## 2018-09-24 MED ORDER — BUPIVACAINE HCL (PF) 0.5 % IJ SOLN
INTRAMUSCULAR | Status: DC | PRN
  Administered 2018-09-24: 10 mL

## 2018-09-24 MED ORDER — OXYCODONE HCL 5 MG PO TABS
ORAL_TABLET | ORAL | Status: AC
  Filled 2018-09-24: qty 1

## 2018-09-24 MED ORDER — ONDANSETRON HCL 4 MG/2ML IJ SOLN
INTRAMUSCULAR | Status: AC
  Filled 2018-09-24: qty 2

## 2018-09-24 MED ORDER — ROCURONIUM BROMIDE 50 MG/5ML IV SOLN
INTRAVENOUS | Status: AC
  Filled 2018-09-24: qty 2

## 2018-09-24 MED ORDER — OXYCODONE-ACETAMINOPHEN 5-325 MG PO TABS: 1.0000 | ORAL_TABLET | ORAL | 0 refills | Status: DC | PRN

## 2018-09-24 MED ORDER — FAMOTIDINE 20 MG PO TABS
20.0000 mg | ORAL_TABLET | Freq: Once | ORAL | Status: AC
  Administered 2018-09-24: 20 mg via ORAL

## 2018-09-24 MED ORDER — FENTANYL CITRATE (PF) 250 MCG/5ML IJ SOLN
INTRAMUSCULAR | Status: AC
  Filled 2018-09-24: qty 5

## 2018-09-24 MED ORDER — KETOROLAC TROMETHAMINE 30 MG/ML IJ SOLN
INTRAMUSCULAR | Status: DC | PRN
  Administered 2018-09-24: 30 mg via INTRAVENOUS

## 2018-09-24 MED ORDER — MIDAZOLAM HCL 2 MG/2ML IJ SOLN
INTRAMUSCULAR | Status: AC
  Filled 2018-09-24: qty 2

## 2018-09-24 MED ORDER — OXYCODONE-ACETAMINOPHEN 5-325 MG PO TABS: 1.0000 | ORAL_TABLET | ORAL | Status: DC | PRN

## 2018-09-24 MED ORDER — PROMETHAZINE HCL 25 MG/ML IJ SOLN: 6.2500 mg | INTRAMUSCULAR | Status: DC | PRN

## 2018-09-24 MED ORDER — MIDAZOLAM HCL 2 MG/2ML IJ SOLN
INTRAMUSCULAR | Status: DC | PRN
  Administered 2018-09-24: 2 mg via INTRAVENOUS

## 2018-09-24 MED ORDER — OXYCODONE HCL 5 MG/5ML PO SOLN: 5.0000 mg | Freq: Once | ORAL | Status: AC | PRN

## 2018-09-24 MED ORDER — LIDOCAINE HCL (PF) 2 % IJ SOLN
INTRAMUSCULAR | Status: AC
  Filled 2018-09-24: qty 10

## 2018-09-24 SURGICAL SUPPLY — 88 items
BAG COUNTER SPONGE EZ (MISCELLANEOUS) ×4 IMPLANT
BAG URINE DRAINAGE (UROLOGICAL SUPPLIES) ×10 IMPLANT
BLADE SURG 15 STRL LF DISP TIS (BLADE) ×3 IMPLANT
BLADE SURG 15 STRL SS (BLADE) ×2
BLADE SURG SZ10 CARB STEEL (BLADE) ×10 IMPLANT
BLADE SURG SZ11 CARB STEEL (BLADE) ×5 IMPLANT
CANISTER SUCT 1200ML W/VALVE (MISCELLANEOUS) ×10 IMPLANT
CATH FOLEY 2WAY  5CC 16FR (CATHETERS) ×4
CATH URTH 16FR FL 2W BLN LF (CATHETERS) ×6 IMPLANT
CHLORAPREP W/TINT 26ML (MISCELLANEOUS) ×5 IMPLANT
COUNTER SPONGE BAG EZ (MISCELLANEOUS) ×1
COVER LIGHT HANDLE STERIS (MISCELLANEOUS) ×5 IMPLANT
COVER WAND RF STERILE (DRAPES) ×10 IMPLANT
DEFOGGER SCOPE WARMER CLEARIFY (MISCELLANEOUS) ×5 IMPLANT
DERMABOND ADVANCED (GAUZE/BANDAGES/DRESSINGS) ×2
DERMABOND ADVANCED .7 DNX12 (GAUZE/BANDAGES/DRESSINGS) ×3 IMPLANT
DEVICE SUTURE ENDOST 10MM (ENDOMECHANICALS) IMPLANT
DRAPE CAMERA CLOSED 9X96 (DRAPES) ×5 IMPLANT
DRAPE PERI LITHO V/GYN (MISCELLANEOUS) ×10 IMPLANT
DRAPE SHEET LG 3/4 BI-LAMINATE (DRAPES) ×10 IMPLANT
DRAPE UNDER BUTTOCK W/FLU (DRAPES) ×10 IMPLANT
DRSG TEGADERM 2-3/8X2-3/4 SM (GAUZE/BANDAGES/DRESSINGS) ×5 IMPLANT
ELECT REM PT RETURN 9FT ADLT (ELECTROSURGICAL) ×10
ELECTRODE REM PT RTRN 9FT ADLT (ELECTROSURGICAL) ×6 IMPLANT
GAUZE 4X4 16PLY RFD (DISPOSABLE) ×10 IMPLANT
GAUZE PACK 2X3YD (MISCELLANEOUS) ×10 IMPLANT
GLOVE BIO SURGEON STRL SZ 6 (GLOVE) ×15 IMPLANT
GLOVE BIO SURGEON STRL SZ8 (GLOVE) ×35 IMPLANT
GLOVE INDICATOR 8.0 STRL GRN (GLOVE) ×40 IMPLANT
GOWN STRL REUS W/ TWL LRG LVL3 (GOWN DISPOSABLE) ×18 IMPLANT
GOWN STRL REUS W/ TWL XL LVL3 (GOWN DISPOSABLE) ×9 IMPLANT
GOWN STRL REUS W/TWL LRG LVL3 (GOWN DISPOSABLE) ×12
GOWN STRL REUS W/TWL XL LVL3 (GOWN DISPOSABLE) ×6
GRASPER SUT TROCAR 14GX15 (MISCELLANEOUS) ×5 IMPLANT
IRRIGATION STRYKERFLOW (MISCELLANEOUS) ×3 IMPLANT
IRRIGATOR STRYKERFLOW (MISCELLANEOUS) ×5
IV LACTATED RINGERS 1000ML (IV SOLUTION) ×10 IMPLANT
KIT PINK PAD W/HEAD ARE REST (MISCELLANEOUS) ×5
KIT PINK PAD W/HEAD ARM REST (MISCELLANEOUS) ×3 IMPLANT
KIT TURNOVER CYSTO (KITS) ×5 IMPLANT
KIT TURNOVER KIT A (KITS) ×5 IMPLANT
LABEL OR SOLS (LABEL) ×10 IMPLANT
MANIPULATOR VCARE LG CRV RETR (MISCELLANEOUS) IMPLANT
MANIPULATOR VCARE SML CRV RETR (MISCELLANEOUS) IMPLANT
MANIPULATOR VCARE STD CRV RETR (MISCELLANEOUS) ×5 IMPLANT
NDL HPO THNWL 1X22GA REG BVL (NEEDLE) ×6 IMPLANT
NEEDLE HYPO 22GX1.5 SAFETY (NEEDLE) ×5 IMPLANT
NEEDLE SAFETY 22GX1 (NEEDLE) ×4
NEEDLE SPNL 22GX3.5 QUINCKE BK (NEEDLE) ×5 IMPLANT
NEEDLE VERESS 14GA 120MM (NEEDLE) ×5 IMPLANT
NS IRRIG 500ML POUR BTL (IV SOLUTION) ×10 IMPLANT
OCCLUDER COLPOPNEUMO (BALLOONS) ×5 IMPLANT
PACK BASIN MINOR ARMC (MISCELLANEOUS) ×10 IMPLANT
PACK GYN LAPAROSCOPIC (MISCELLANEOUS) ×5 IMPLANT
PAD OB MATERNITY 4.3X12.25 (PERSONAL CARE ITEMS) ×10 IMPLANT
PAD PREP 24X41 OB/GYN DISP (PERSONAL CARE ITEMS) ×10 IMPLANT
PORT ACCESS TROCAR AIRSEAL 12 (TROCAR) ×3 IMPLANT
PORT ACCESS TROCAR AIRSEAL 5M (TROCAR) ×2
SCISSORS METZENBAUM CVD 33 (INSTRUMENTS) ×5 IMPLANT
SET CYSTO W/LG BORE CLAMP LF (SET/KITS/TRAYS/PACK) ×5 IMPLANT
SET TRI-LUMEN FLTR TB AIRSEAL (TUBING) ×5 IMPLANT
SHEARS HARMONIC ACE PLUS 36CM (ENDOMECHANICALS) ×5 IMPLANT
SLEEVE ENDOPATH XCEL 5M (ENDOMECHANICALS) ×5 IMPLANT
SOL PREP PVP 2OZ (MISCELLANEOUS) ×5
SOLUTION PREP PVP 2OZ (MISCELLANEOUS) ×3 IMPLANT
SPONGE GAUZE 2X2 8PLY STER LF (GAUZE/BANDAGES/DRESSINGS) ×1
SPONGE GAUZE 2X2 8PLY STRL LF (GAUZE/BANDAGES/DRESSINGS) ×4 IMPLANT
STRAP SAFETY 5IN WIDE (MISCELLANEOUS) ×5 IMPLANT
SURGILUBE 2OZ TUBE FLIPTOP (MISCELLANEOUS) ×5 IMPLANT
SUT ENDO VLOC 180-0-8IN (SUTURE) ×10 IMPLANT
SUT ETHIBOND NAB CT1 #1 30IN (SUTURE) ×45 IMPLANT
SUT VIC AB 0 CT1 27 (SUTURE) ×4
SUT VIC AB 0 CT1 27XCR 8 STRN (SUTURE) ×6 IMPLANT
SUT VIC AB 0 CT1 36 (SUTURE) ×5 IMPLANT
SUT VIC AB 2-0 CT1 (SUTURE) ×15 IMPLANT
SUT VIC AB 2-0 CT1 27 (SUTURE) ×4
SUT VIC AB 2-0 CT1 TAPERPNT 27 (SUTURE) ×6 IMPLANT
SUT VIC AB 3-0 SH 27 (SUTURE) ×4
SUT VIC AB 3-0 SH 27X BRD (SUTURE) ×6 IMPLANT
SUT VIC AB 4-0 FS2 27 (SUTURE) ×5 IMPLANT
SYR 10ML LL (SYRINGE) ×10 IMPLANT
SYR 50ML LL SCALE MARK (SYRINGE) ×5 IMPLANT
SYR BULB IRRIG 60ML STRL (SYRINGE) ×5 IMPLANT
SYR CONTROL 10ML (SYRINGE) ×10 IMPLANT
SYSTEM WECK SHIELD CLOSURE (TROCAR) ×5 IMPLANT
TROCAR ENDO BLADELESS 11MM (ENDOMECHANICALS) ×5 IMPLANT
TROCAR XCEL NON-BLD 5MMX100MML (ENDOMECHANICALS) ×5 IMPLANT
TUBING INSUF HEATED (TUBING) ×5 IMPLANT

## 2018-09-24 NOTE — Progress Notes (Signed)
Feels like needs to void  Bladder scan shows 39

## 2018-09-24 NOTE — Op Note (Signed)
Operative Report:  PRE-OP DIAGNOSIS: fibroid uterus,pelvic pressure,rectocele   POST-OP DIAGNOSIS: fibroid uterus,pelvic pressure,rectocele   PROCEDURE: Procedure(s): HYSTERECTOMY TOTAL LAPAROSCOPIC WITH BSO POSTERIOR REPAIR CYSTOSCOPY  SURGEON: Barnett Applebaum, MD, FACOG  ASSISTANT: Dr Georgianne Fick, No other capable assistant available, in surgery requiring high level assistant.  ANESTHESIA: General endotracheal anesthesia  ESTIMATED BLOOD LOSS: less than 50   SPECIMENS: Uterus, Tubes, Ovaries.  COMPLICATIONS: None  DISPOSITION: stable to PACU  FINDINGS: Intraabdominal adhesions were not noted.  Large fibroid (Intramural) uterus.  Normal small ovaries. Rectocele was prominent and to level of introitus.  Min cystocele.  PROCEDURE:  The patient was taken to the OR where anesthesia was administed. She was prepped and draped in the normal sterile fashion in the dorsal lithotomy position in the [place] stirrups. A time out was performed. A Graves speculum was inserted, the cervix was grasped with a single tooth tenaculum and the endometrial cavity was sounded. The cervix was progressively dilated to a size 18 [place] with [employer] dilators. A V-Care uterine manipulator was inserted in the usual fashion without incident. Gloves were changed and attention was turned to the abdomen.   An infraumbilical transverse 43mm skin incision was made with the scalpel after local anesthesia applied to the skin. A Veress-step needle was inserted in the usual fashion and confirmed using the hanging drop technique. A pneumoperitoneum was obtained by insufflation of CO2 (opening pressure of 26mmHg) to 30mmHg. A diagnostic laparoscopy was performed yielding the previously described findings. Attention was turned to the left lower quadrant where after visualization of the inferior epigastric vessels a 57mm skin incision was made with the scalpel. A 5 mm laparoscopic port was inserted. The same procedure was repeated in the  right lower quadrant with a 44mm trocar. Attention was turned to the left aspect of the uterus, where after visualization of the ureter, the round ligament was coagulated and transected using the 70mm Harmonic Scapel. The anterior and posterior leafs of the broad ligament were dissected off as the anterior one was coagulated and transected in a caudal direction towards the cuff of the uterine manipulator.  Attention was then turned to the left fallopian tube and ovary which was recognized by visualization of the fimbria. The infundibulopelvic ligament and its blood vessels were carefully coagulated and transected using the Harmonic scapel.  Attention was turned to the right aspect of the uterus where the same procedure was performed.  The vesicouterine reflection of the peritoneum was dissected with the harmonic scapel and the bladder flap was created bluntly.  The uterine vessels were coagulated and transected bilaterally using first bipolar cautery and then the harmonic scapel. A 360 degree, circumferential colpotomy was done to completely amputate the uterus with cervix and tubes. Once the specimen was amputated it was delivered through the vagina.   The colpotomy was repaired in a running V-lock delayed absorbable suture with an endo-stitch device.  Vaginal exam confirms complete closure.  The cavity was copiously irrigated. A survey of the pelvic cavity revealed adequate hemostasis and no injury to bowel, bladder, or ureter.  Posterior colporrhaphy is performed.  Allis clamps are placed along the posterior vaginal wall, lidocaine is used to infiltrate the plane, and incision is made midline vertical.  Endopelvic fascia is dissected free of vaginal mucosa.  Fascia is plicated w interrupted vicryl sutures.  Ethibond suture also used at the introitus.  Excess mucosa is excised.  Vaginal incision is closed with a running locking Vicryl suture.  Perineorrhaphy is performed on the external  skin as well as to  strengthen and plicate the perineal body.    Excellent hemostasis was noted at the end of the case.     A diagnostic cystoscopy was performed using saline distension of bladder with no lesions or injuries noted.  Bilateral urine flow from each ureteral orifice is visualized.  At this point the procedure was finalized. All the instruments were removed from the patient's body. Gas was expelled and patient is leveled.  Incisions are closed with skin adhesive.    Patient goes to recovery room in stable condition.  All sponge, instrument, and needle counts are correct x2.     Barnett Applebaum, MD, [facility], [facility] 09/24/2018  12:05 PM

## 2018-09-24 NOTE — Anesthesia Preprocedure Evaluation (Signed)
Anesthesia Evaluation  Patient identified by MRN, date of birth, ID band Patient awake    Reviewed: Allergy & Precautions, NPO status , Patient's Chart, lab work & pertinent test results  History of Anesthesia Complications Negative for: history of anesthetic complications  Airway Mallampati: II  TM Distance: >3 FB Neck ROM: Full    Dental no notable dental hx.    Pulmonary neg pulmonary ROS, neg sleep apnea, neg COPD,    breath sounds clear to auscultation- rhonchi (-) wheezing      Cardiovascular hypertension, Pt. on medications (-) CAD, (-) Past MI, (-) Cardiac Stents and (-) CABG  Rhythm:Regular Rate:Normal - Systolic murmurs and - Diastolic murmurs    Neuro/Psych negative neurological ROS  negative psych ROS   GI/Hepatic negative GI ROS, Neg liver ROS,   Endo/Other  diabetes, Oral Hypoglycemic Agents  Renal/GU negative Renal ROS     Musculoskeletal  (+) Arthritis ,   Abdominal (+) + obese,   Peds  Hematology negative hematology ROS (+)   Anesthesia Other Findings Past Medical History: No date: Bronchitis No date: Diabetes mellitus without complication (HCC) No date: Hypertension No date: Hyperthyroidism No date: Osteoarthritis No date: Pyelonephritis No date: Sciatica No date: Urinary tract infection   Reproductive/Obstetrics                             Anesthesia Physical Anesthesia Plan  ASA: II  Anesthesia Plan: General   Post-op Pain Management:    Induction: Intravenous  PONV Risk Score and Plan: 2 and Ondansetron, Dexamethasone and Midazolam  Airway Management Planned: Oral ETT  Additional Equipment:   Intra-op Plan:   Post-operative Plan: Extubation in OR  Informed Consent: I have reviewed the patients History and Physical, chart, labs and discussed the procedure including the risks, benefits and alternatives for the proposed anesthesia with the patient or  authorized representative who has indicated his/her understanding and acceptance.   Dental advisory given  Plan Discussed with: CRNA and Anesthesiologist  Anesthesia Plan Comments:         Anesthesia Quick Evaluation

## 2018-09-24 NOTE — Anesthesia Post-op Follow-up Note (Signed)
Anesthesia QCDR form completed.        

## 2018-09-24 NOTE — Interval H&P Note (Signed)
History and Physical Interval Note:  09/24/2018 9:00 AM  Kristin Wilson  has presented today for surgery, with the diagnosis of fibroid uterus,pelvic pressure,rectocele  The various methods of treatment have been discussed with the patient and family. After consideration of risks, benefits and other options for treatment, the patient has consented to  Procedure(s): HYSTERECTOMY TOTAL LAPAROSCOPIC- BSO (Bilateral) ANTERIOR AND POSTERIOR REPAIR (N/A) CYSTOSCOPY (N/A) as a surgical intervention .  The patient's history has been reviewed, patient examined, no change in status, stable for surgery.  I have reviewed the patient's chart and labs.  Questions were answered to the patient's satisfaction.     Hoyt Koch

## 2018-09-24 NOTE — OR Nursing (Signed)
Incentive spirometer explained with return demonstration 

## 2018-09-24 NOTE — Anesthesia Procedure Notes (Addendum)
Procedure Name: Intubation Date/Time: 09/24/2018 9:35 AM Performed by: Eben Burow, CRNA Pre-anesthesia Checklist: Patient identified, Emergency Drugs available, Suction available, Patient being monitored and Timeout performed Patient Re-evaluated:Patient Re-evaluated prior to induction Oxygen Delivery Method: Circle system utilized Preoxygenation: Pre-oxygenation with 100% oxygen Induction Type: IV induction Ventilation: Mask ventilation without difficulty Laryngoscope Size: Mac and 3 Grade View: Grade I Tube type: Oral Tube size: 7.0 mm Number of attempts: 1 Airway Equipment and Method: Stylet and LTA kit utilized Placement Confirmation: ETT inserted through vocal cords under direct vision,  positive ETCO2 and breath sounds checked- equal and bilateral Secured at: 21 cm Tube secured with: Tape Dental Injury: Teeth and Oropharynx as per pre-operative assessment  Comments: Intubation performed by Lilyan Gilford SRNA

## 2018-09-24 NOTE — Transfer of Care (Signed)
Immediate Anesthesia Transfer of Care Note  Patient: Kristin Wilson  Procedure(s) Performed: HYSTERECTOMY TOTAL LAPAROSCOPIC- BSO (Bilateral Abdomen) POSTERIOR REPAIR (N/A Vagina ) CYSTOSCOPY (N/A Bladder)  Patient Location: PACU  Anesthesia Type:General  Level of Consciousness: drowsy  Airway & Oxygen Therapy: Patient Spontanous Breathing and Patient connected to face mask oxygen  Post-op Assessment: Report given to RN and Post -op Vital signs reviewed and stable  Post vital signs: Reviewed and stable  Last Vitals:  Vitals Value Taken Time  BP 105/54 09/24/2018 12:10 PM  Temp    Pulse 77 09/24/2018 12:15 PM  Resp 15 09/24/2018 12:15 PM  SpO2 97 % 09/24/2018 12:15 PM  Vitals shown include unvalidated device data.  Last Pain:  Vitals:   09/24/18 0816  TempSrc: Temporal  PainSc: 5          Complications: No apparent anesthesia complications

## 2018-09-24 NOTE — Discharge Instructions (Signed)
Total Laparoscopic Hysterectomy, Care After Refer to this sheet in the next few weeks. These instructions provide you with information on caring for yourself after your procedure. Your health care provider may also give you more specific instructions. Your treatment has been planned according to current medical practices, but problems sometimes occur. Call your health care provider if you have any problems or questions after your procedure. What can I expect after the procedure?  Pain and bruising at the incision sites. You will be given pain medicine to control it.  Menopausal symptoms such as hot flashes, night sweats, and insomnia if your ovaries were removed.  Sore throat from the breathing tube that was inserted during surgery. Follow these instructions at home:  Only take over-the-counter or prescription medicines for pain, discomfort, or fever as directed by your health care provider.  Do not take aspirin. It can cause bleeding.  Do not drive when taking pain medicine.  Follow your health care provider's advice regarding diet, exercise, lifting, driving, and general activities.  NO HEAVY LIFTING for 6 WEEKS  AVOID CONSTIPATION for 6 WEEKS (OK to take Colace, Dulcolax)  Resume your usual diet as directed and allowed.  Get plenty of rest and sleep.  Do not douche, use tampons, or have sexual intercourse for at least 6 weeks, or until your health care provider gives you permission.  Change your bandages (dressings) as directed by your health care provider - - - TOMORROW.  Monitor your temperature and notify your health care provider of a fever.  Take showers instead of baths for 2-3 weeks.  Do not drink alcohol until your health care provider gives you permission.  If you develop constipation, you may take a mild laxative with your health care provider's permission. Bran foods may help with constipation problems. Drinking enough fluids to keep your urine clear or pale yellow  may help as well.  Try to have someone home with you for 1-2 weeks to help around the house.  Keep all of your follow-up appointments as directed by your health care provider. Contact a health care provider if:  You have swelling, redness, or increasing pain around your incision sites.  You have pus coming from your incision.  You notice a bad smell coming from your incision.  Your incision breaks open.  You feel dizzy or lightheaded.  You have pain or bleeding when you urinate.  You have persistent diarrhea.  You have persistent nausea and vomiting.  You have abnormal vaginal discharge.  You have a rash.  You have any type of abnormal reaction or develop an allergy to your medicine.  You have poor pain control with your prescribed medicine. Get help right away if:  You have chest pain or shortness of breath.  You have severe abdominal pain that is not relieved with pain medicine.  You have pain or swelling in your legs. This information is not intended to replace advice given to you by your health care provider. Make sure you discuss any questions you have with your health care provider. Document Released: 09/03/2013 Document Revised: 04/20/2016 Document Reviewed: 06/03/2013 Elsevier Interactive Patient Education  2017 [place]   1) The drugs that you were given will stay in your system until tomorrow so for the next 24 hours you should not:  A) Drive an automobile B) Make any legal decisions C) Drink any alcoholic beverage   2) You may resume regular meals tomorrow.  Today it is  better to start with liquids and gradually work up to solid foods.  You may eat anything you prefer, but it is better to start with liquids, then soup and crackers, and gradually work up to solid foods.   3) Please notify your doctor immediately if you have any unusual bleeding, trouble breathing, redness and pain at the surgery  site, drainage, fever, or pain not relieved by medication.    4) Additional Instructions:   Please contact your physician with any problems or Same Day Surgery at [phone], Monday through Friday 6 am to 4 pm, or [place] at [facility] number at [phone].

## 2018-09-24 NOTE — Anesthesia Postprocedure Evaluation (Signed)
Anesthesia Post Note  Patient: Kristin Wilson  Procedure(s) Performed: HYSTERECTOMY TOTAL LAPAROSCOPIC- BSO (Bilateral Abdomen) POSTERIOR REPAIR (N/A Vagina ) CYSTOSCOPY (N/A Bladder)  Patient location during evaluation: PACU Anesthesia Type: General Level of consciousness: awake and alert and oriented Pain management: pain level controlled Vital Signs Assessment: post-procedure vital signs reviewed and stable Respiratory status: spontaneous breathing, nonlabored ventilation and respiratory function stable Cardiovascular status: blood pressure returned to baseline and stable Postop Assessment: no signs of nausea or vomiting Anesthetic complications: no     Last Vitals:  Vitals:   09/24/18 1255 09/24/18 1314  BP: 116/64 113/70  Pulse: 76 69  Resp: 17 16  Temp: 36.8 C 36.7 C  SpO2: 92% 97%    Last Pain:  Vitals:   09/24/18 1314  TempSrc: Oral  PainSc: 3                  Ilham Roughton

## 2018-09-25 ENCOUNTER — Encounter: Payer: Self-pay | Admitting: Obstetrics & Gynecology

## 2018-09-30 LAB — SURGICAL PATHOLOGY

## 2018-10-10 ENCOUNTER — Ambulatory Visit (INDEPENDENT_AMBULATORY_CARE_PROVIDER_SITE_OTHER): Payer: Medicare HMO | Admitting: Obstetrics & Gynecology

## 2018-10-10 ENCOUNTER — Encounter: Payer: Self-pay | Admitting: Obstetrics & Gynecology

## 2018-10-10 VITALS — BP 130/80 | Ht 62.0 in | Wt 183.0 lb

## 2018-10-10 DIAGNOSIS — N816 Rectocele: Secondary | ICD-10-CM

## 2018-10-10 DIAGNOSIS — R102 Pelvic and perineal pain: Secondary | ICD-10-CM

## 2018-10-10 DIAGNOSIS — D219 Benign neoplasm of connective and other soft tissue, unspecified: Secondary | ICD-10-CM

## 2018-10-10 NOTE — Progress Notes (Signed)
  Postoperative Follow-up Patient presents post op from [place] for fibroids, pelvic pain and rectocele, 2 weeks ago. Images:   PATH: DIAGNOSIS:  A. 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  - PROLIFERATIVE ENDOMETRIUM.  - MYOMETRIUM WITH INTRAMURAL AND SUBMUCOSAL LEIOMYOMATA, LARGEST  MEASURING 3.5 CM, WITH FOCAL INFARCTION.  - BILATERAL OVARIES WITH CYSTIC FOLLICLES.  - BILATERAL FALLOPIAN TUBES WITH FIBROUS SEROSAL ADHESIONS.  - NEGATIVE FOR ATYPIA AND MALIGNANCY.  Subjective: Patient reports marked improvement in her preop symptoms. Eating a regular diet without difficulty. The patient is not having any pain.  Activity: normal activities of daily living. Patient reports additional symptom's since surgery of None.  Objective: BP 130/80   Ht 5\' 2"  (1.575 m)   Wt 183 lb (83 kg)   LMP 09/22/2017   BMI 33.47 kg/m  Physical Exam  Constitutional: She is oriented to person, place, and time. She appears well-developed and well-nourished. No distress.  Cardiovascular: Normal rate.  Pulmonary/Chest: Effort normal.  Abdominal: Soft. She exhibits no distension. There is no tenderness.  Incision Healing Well   Musculoskeletal: Normal range of motion.  Neurological: She is alert and oriented to person, place, and time. No cranial nerve deficit.  Skin: Skin is warm and dry.  Psychiatric: She has a normal mood and affect.    Assessment: s/p :  posterior colporrhaphy and total laparoscopic hysterectomy with bilateral salpingectomy progressing well  Plan: Patient has done well after surgery with no apparent complications.  I have discussed the post-operative course to date, and the expected progress moving forward.  The patient understands what complications to be concerned about.  I will see the patient in routine follow up, or  sooner if needed.    Activity plan: No heavy lifting. Pelvic rest.  Avoid constipation.  Hoyt Koch 10/10/2018, 2:15 PM

## 2018-10-31 ENCOUNTER — Encounter: Payer: Self-pay | Admitting: Emergency Medicine

## 2018-10-31 ENCOUNTER — Other Ambulatory Visit: Payer: Self-pay

## 2018-10-31 ENCOUNTER — Emergency Department: Payer: Medicare HMO

## 2018-10-31 ENCOUNTER — Emergency Department
Admission: EM | Admit: 2018-10-31 | Discharge: 2018-10-31 | Disposition: A | Discharge status: Home / Self Care | Payer: Medicare HMO | Attending: Emergency Medicine | Admitting: Emergency Medicine

## 2018-10-31 DIAGNOSIS — E119 Type 2 diabetes mellitus without complications: Secondary | ICD-10-CM | POA: Diagnosis not present

## 2018-10-31 DIAGNOSIS — R1032 Left lower quadrant pain: Secondary | ICD-10-CM | POA: Diagnosis present

## 2018-10-31 DIAGNOSIS — Z79899 Other long term (current) drug therapy: Secondary | ICD-10-CM | POA: Insufficient documentation

## 2018-10-31 DIAGNOSIS — Z7984 Long term (current) use of oral hypoglycemic drugs: Secondary | ICD-10-CM | POA: Diagnosis not present

## 2018-10-31 DIAGNOSIS — N12 Tubulo-interstitial nephritis, not specified as acute or chronic: Secondary | ICD-10-CM

## 2018-10-31 DIAGNOSIS — N2889 Other specified disorders of kidney and ureter: Secondary | ICD-10-CM | POA: Diagnosis not present

## 2018-10-31 DIAGNOSIS — N10 Acute tubulo-interstitial nephritis: Secondary | ICD-10-CM | POA: Insufficient documentation

## 2018-10-31 DIAGNOSIS — I10 Essential (primary) hypertension: Secondary | ICD-10-CM | POA: Insufficient documentation

## 2018-10-31 LAB — URINALYSIS, COMPLETE (UACMP) WITH MICROSCOPIC
Bacteria, UA: NONE SEEN
Bilirubin Urine: NEGATIVE
GLUCOSE, UA: 50 mg/dL — AB
Ketones, ur: NEGATIVE mg/dL
NITRITE: NEGATIVE
PH: 5 (ref 5.0–8.0)
PROTEIN: NEGATIVE mg/dL
Specific Gravity, Urine: 1.016 (ref 1.005–1.030)

## 2018-10-31 LAB — CBC
HEMATOCRIT: 42.3 % (ref 36.0–46.0)
HEMOGLOBIN: 14.1 g/dL (ref 12.0–15.0)
MCH: 25.7 pg — AB (ref 26.0–34.0)
MCHC: 33.3 g/dL (ref 30.0–36.0)
MCV: 77 fL — AB (ref 80.0–100.0)
Platelets: 286 10*3/uL (ref 150–400)
RBC: 5.49 MIL/uL — ABNORMAL HIGH (ref 3.87–5.11)
RDW: 12.7 % (ref 11.5–15.5)
WBC: 6.9 10*3/uL (ref 4.0–10.5)
nRBC: 0 % (ref 0.0–0.2)

## 2018-10-31 LAB — COMPREHENSIVE METABOLIC PANEL
ALK PHOS: 107 U/L (ref 38–126)
ALT: 22 U/L (ref 0–44)
AST: 17 U/L (ref 15–41)
Albumin: 4.2 g/dL (ref 3.5–5.0)
Anion gap: 8 (ref 5–15)
BILIRUBIN TOTAL: 0.4 mg/dL (ref 0.3–1.2)
BUN: 15 mg/dL (ref 6–20)
CALCIUM: 9.4 mg/dL (ref 8.9–10.3)
CO2: 29 mmol/L (ref 22–32)
CREATININE: 0.42 mg/dL — AB (ref 0.44–1.00)
Chloride: 99 mmol/L (ref 98–111)
Glucose, Bld: 189 mg/dL — ABNORMAL HIGH (ref 70–99)
Potassium: 4 mmol/L (ref 3.5–5.1)
SODIUM: 136 mmol/L (ref 135–145)
TOTAL PROTEIN: 7.2 g/dL (ref 6.5–8.1)

## 2018-10-31 MED ORDER — PHENAZOPYRIDINE HCL 200 MG PO TABS
200.0000 mg | ORAL_TABLET | Freq: Once | ORAL | Status: AC
  Administered 2018-10-31: 200 mg via ORAL
  Filled 2018-10-31: qty 1

## 2018-10-31 MED ORDER — PHENAZOPYRIDINE HCL 200 MG PO TABS: 200.0000 mg | ORAL_TABLET | Freq: Three times a day (TID) | ORAL | 0 refills | Status: DC | PRN

## 2018-10-31 MED ORDER — CEPHALEXIN 500 MG PO CAPS
500.0000 mg | ORAL_CAPSULE | Freq: Once | ORAL | Status: AC
  Administered 2018-10-31: 500 mg via ORAL
  Filled 2018-10-31: qty 1

## 2018-10-31 MED ORDER — CEPHALEXIN 500 MG PO CAPS: 500.0000 mg | ORAL_CAPSULE | Freq: Four times a day (QID) | ORAL | 0 refills | Status: AC

## 2018-10-31 MED ORDER — HYDROCODONE-ACETAMINOPHEN 5-325 MG PO TABS
1.0000 | ORAL_TABLET | Freq: Once | ORAL | Status: AC
  Administered 2018-10-31: 1 via ORAL
  Filled 2018-10-31: qty 1

## 2018-10-31 NOTE — ED Provider Notes (Signed)
[facility] Emergency Department Provider Note  ____________________________________________   First MD Initiated Contact with Patient 10/31/18 0127     (approximate)  I have reviewed the triage vital signs and the nursing notes.   HISTORY  Chief Complaint Flank Pain   HPI Kristin Wilson is a 57 y.o. female who self presents to the emergency department with roughly 24 hours of left flank pain.   She is concerned that this could be pain and "my kidney".  Reports some dysuria and frequency.  She denies hematuria.  She denies fevers or chills.  I actually am quite familiar with the patient as 2 months ago I saw her for flank pain and obtained a CT scan to evaluate for renal colic and at that time diagnosed her with large left-sided uterine fibroids.  She subsequently was seen by [facility] gynecology and had a hysterectomy performed.  Her pain initially improved however recurred yesterday which prompted the visit.  Her pain was sudden onset moderate to severe nothing seems to make it better or worse.   Past Medical History:  Diagnosis Date  . Bronchitis   . Diabetes mellitus without complication ([place])   . Hypertension   . Hyperthyroidism   . Osteoarthritis   . Pyelonephritis   . Sciatica   . Urinary tract infection     Patient Active Problem List   Diagnosis Date Noted  . Fibroid 09/09/2018  . Pelvic pain 09/09/2018  . Rectocele 09/09/2018  . Sepsis ([place]) 09/08/2017  . Chest discomfort 10/08/2015  . DOE (dyspnea on exertion) 10/08/2015  . Hyperglycemia 10/08/2015    Past Surgical History:  Procedure Laterality Date  . ANTERIOR AND POSTERIOR REPAIR WITH SACROSPINOUS FIXATION N/A 09/24/2018   Procedure: POSTERIOR REPAIR;  Surgeon: Gae Dry, MD;  Location: [facility] ORS;  Service: Gynecology;  Laterality: N/A;  . cholecystecomy    . CHOLECYSTECTOMY    . CYSTOSCOPY N/A 09/24/2018   Procedure: CYSTOSCOPY;  Surgeon: Gae Dry, MD;  Location:  [facility] ORS;  Service: Gynecology;  Laterality: N/A;  . LAPAROSCOPIC HYSTERECTOMY Bilateral 09/24/2018   Procedure: HYSTERECTOMY TOTAL LAPAROSCOPIC- BSO;  Surgeon: Gae Dry, MD;  Location: [facility] ORS;  Service: Gynecology;  Laterality: Bilateral;  . TUBAL LIGATION      Prior to Admission medications   Medication Sig Start Date End Date Taking? Authorizing Provider  albuterol (PROVENTIL HFA;VENTOLIN HFA) 108 (90 Base) MCG/ACT inhaler Inhale 2 puffs into the lungs every 4 (four) hours as needed for wheezing or shortness of breath. Patient not taking: Reported on 09/24/2018 10/20/17   Paulette Blanch, MD  cephALEXin (KEFLEX) 500 MG capsule Take 1 capsule (500 mg total) by mouth 4 (four) times daily for 10 days. 10/31/18 11/10/18  Darel Hong, MD  chlorpheniramine-HYDROcodone (TUSSIONEX PENNKINETIC ER) 10-8 MG/5ML SUER Take 5 mLs by mouth 2 (two) times daily. Patient not taking: Reported on 09/19/2018 10/20/17   Paulette Blanch, MD  ciprofloxacin (CIPRO) 500 MG tablet Take 1 tablet (500 mg total) by mouth 2 (two) times daily. Patient not taking: Reported on 09/19/2018 09/11/17   Fritzi Mandes, MD  cyclobenzaprine (FLEXERIL) 5 MG tablet Take 1 tablet (5 mg total) by mouth 3 (three) times daily as needed for muscle spasms. 08/23/17   [name], [name], PA-C  glipiZIDE (GLUCOTROL XL) 10 MG 24 hr tablet Take 10 mg by mouth daily. 06/17/18   [provider]  ibuprofen (ADVIL,MOTRIN) 600 MG tablet Take 1 tablet (600 mg total) by mouth  every 8 (eight) hours as needed. 09/05/18   Darel Hong, MD  ibuprofen (ADVIL,MOTRIN) 800 MG tablet Take 800 mg by mouth every 8 (eight) hours as needed.    [provider]  meclizine (ANTIVERT) 12.5 MG tablet Take 1 tablet (12.5 mg total) by mouth 3 (three) times daily as needed for dizziness. Patient not taking: Reported on 09/19/2018 08/23/17   [name], [name], PA-C  metFORMIN (GLUCOPHAGE) 500 MG tablet Take 500 mg by mouth 2 (two) times  daily. 10/27/16 09/19/18  [provider]  methimazole (TAPAZOLE) 10 MG tablet Take 1 tablet (10 mg total) by mouth 2 (two) times daily. Patient taking differently: Take 20 mg by mouth every morning.  10/08/15   [name], [name], MD  methocarbamol (ROBAXIN) 500 MG tablet Take 1 tablet (500 mg total) by mouth 4 (four) times daily. Patient not taking: Reported on 09/19/2018 08/20/17   [name], [name], PA-C  methylPREDNISolone (MEDROL DOSEPAK) 4 MG TBPK tablet Take as directed Patient not taking: Reported on 09/19/2018 10/20/17   Paulette Blanch, MD  metoprolol tartrate (LOPRESSOR) 25 MG tablet Take 1 tablet (25 mg total) by mouth 2 (two) times daily. 10/08/15   [name], [name], MD  oxyCODONE-acetaminophen (PERCOCET/ROXICET) 5-325 MG tablet Take 1 tablet by mouth every 4 (four) hours as needed for moderate pain. 09/24/18   Gae Dry, MD  phenazopyridine (PYRIDIUM) 200 MG tablet Take 1 tablet (200 mg total) by mouth 3 (three) times daily as needed for pain. 10/31/18 10/31/19  Darel Hong, MD  traMADol (ULTRAM) 50 MG tablet Take 1 tablet (50 mg total) by mouth every 6 (six) hours as needed. 01/19/16   Loney Hering, MD    Allergies Patient has no known allergies.  Family History  Problem Relation Age of Onset  . Heart failure Mother   . Diabetic kidney disease Paternal Grandmother   . CAD Paternal Grandmother     Social History Social History   Tobacco Use  . Smoking status: Never Smoker  . Smokeless tobacco: Never Used  Substance Use Topics  . Alcohol use: No  . Drug use: No    Review of Systems Constitutional: No fever/chills Eyes: No visual changes. ENT: No sore throat. Cardiovascular: Denies chest pain. Respiratory: Denies shortness of breath. Gastrointestinal: Positive for abdominal pain.  No nausea, no vomiting.  No diarrhea.  No constipation. Genitourinary: Positive for dysuria. Musculoskeletal: Positive for back pain. Skin: Negative for  rash. Neurological: Negative for headaches, focal weakness or numbness.   ____________________________________________   PHYSICAL EXAM:  VITAL SIGNS: ED Triage Vitals  Enc Vitals Group     BP 10/31/18 0025 135/78     Pulse Rate 10/31/18 0025 85     Resp 10/31/18 0025 18     Temp 10/31/18 0025 97.7 F (36.5 C)     Temp Source 10/31/18 0025 Oral     SpO2 10/31/18 0025 99 %     Weight 10/31/18 0026 182 lb 15.7 oz (83 kg)     Height 10/31/18 0026 5\' 2"  (1.575 m)     Head Circumference --      Peak Flow --      Pain Score 10/31/18 0025 7     Pain Loc --      Pain Edu? --      Excl. in [place]? --     Constitutional: Alert and oriented x4 appears obviously somewhat uncomfortable though nontoxic no diaphoresis speaks in full clear sentences Eyes: PERRL EOMI.  Head: Atraumatic. Nose: No congestion/rhinnorhea. Mouth/Throat: No trismus Neck: No stridor.   Cardiovascular: Normal rate, regular rhythm. Grossly normal heart sounds.  Good peripheral circulation. Respiratory: Normal respiratory effort.  No retractions. Lungs CTAB and moving good air Gastrointestinal: Soft nondistended nontender no rebound or guarding no peritonitis mild left-sided costovertebral tenderness Musculoskeletal: No lower extremity edema   Neurologic:  Normal speech and language. No gross focal neurologic deficits are appreciated. Skin:  Skin is warm, dry and intact. No rash noted. Psychiatric: Mood and affect are normal. Speech and behavior are normal.    ____________________________________________   DIFFERENTIAL includes but not limited to  Pyelonephritis, nephrolithiasis, postoperative infection ____________________________________________   LABS (all labs ordered are listed, but only abnormal results are displayed)  Labs Reviewed  URINE CULTURE - Abnormal; Notable for the following components:      Result Value   Culture   (*)    Value: <10,000 COLONIES/mL INSIGNIFICANT GROWTH Performed at [facility], [address], [place], [place] [address]    All other components within normal limits  URINALYSIS, COMPLETE (UACMP) WITH MICROSCOPIC - Abnormal; Notable for the following components:   Color, Urine YELLOW (*)    APPearance CLEAR (*)    Glucose, UA 50 (*)    Hgb urine dipstick SMALL (*)    Leukocytes, UA MODERATE (*)    All other components within normal limits  CBC - Abnormal; Notable for the following components:   RBC 5.49 (*)    MCV 77.0 (*)    MCH 25.7 (*)    All other components within normal limits  COMPREHENSIVE METABOLIC PANEL - Abnormal; Notable for the following components:   Glucose, Bld 189 (*)    Creatinine, Ser 0.42 (*)    All other components within normal limits    Lab work reviewed by me shows leukocytes in the urine along with slight hemoglobin __________________________________________  EKG   ____________________________________________  RADIOLOGY  CT abdomen pelvis reviewed by me shows no acute intra-abdominal pathology ____________________________________________   PROCEDURES  Procedure(s) performed: no  Procedures  Critical Care performed: no  ____________________________________________   INITIAL IMPRESSION / ASSESSMENT AND PLAN / ED COURSE  Pertinent labs & imaging results that were available during my care of the patient were reviewed by me and considered in my medical decision making (see chart for details).   As part of my medical decision making, I reviewed the following data within the [place] History obtained from family if available, nursing notes, old chart and ekg, as well as notes from prior ED visits.  The patient comes to the emergency somewhat uncomfortable appearing with left flank pain and dysuria along with slight hematuria on urinalysis.  She appears considerably more comfortable than the last time I saw her.  She is had no fevers or chills but does have some nausea as well as left-sided  costovertebral tenderness.  Is possible she could have pyelonephritis versus nephrolithiasis so CT scan is pending.  She is not driving so I have given her to [place] as well as Pyridium for pain control now.  Fortunately the patient's CT scan is reassuring.  Her urinalysis is equivocal although in the setting of dysuria and faint costovertebral tenderness I do think is reasonable to treat her as if this is early pyelonephritis.  Urine culture sent and given a first dose of cephalexin now.  I will discharge her home with 10 days of cephalexin along with Pyridium.  Strict return precautions been  given.      ____________________________________________   FINAL CLINICAL IMPRESSION(S) / ED DIAGNOSES  Final diagnoses:  Pyelonephritis      NEW MEDICATIONS STARTED DURING THIS VISIT:  Discharge Medication List as of 10/31/2018  3:18 AM    START taking these medications   Details  cephALEXin (KEFLEX) 500 MG capsule Take 1 capsule (500 mg total) by mouth 4 (four) times daily for 10 days., Starting Thu 10/31/2018, Until Sun 11/10/2018, Print    phenazopyridine (PYRIDIUM) 200 MG tablet Take 1 tablet (200 mg total) by mouth 3 (three) times daily as needed for pain., Starting Thu 10/31/2018, Until Fri 10/31/2019, Print         Note:  This document was prepared using Dragon voice recognition software and may include unintentional dictation errors.     Darel Hong, MD 11/04/18 [id number]

## 2018-10-31 NOTE — ED Triage Notes (Signed)
Patient to ER for left sided flank pain. Denies any dysuria. Denies any fevers.

## 2018-10-31 NOTE — ED Notes (Signed)
Pt to the er for left kidney pain. Pt reports urinating twice since she arrived. Pt is in no acute distress. VSS.

## 2018-10-31 NOTE — Discharge Instructions (Signed)
It was a pleasure to take care of you today, and thank you for coming to our emergency department.  If you have any questions or concerns before leaving please ask the nurse to grab me and I'm more than happy to go through your aftercare instructions again.  If you were prescribed any opioid pain medication today such as Norco, Vicodin, Percocet, morphine, hydrocodone, or oxycodone please make sure you do not drive when you are taking this medication as it can alter your ability to drive safely.  If you have any concerns once you are home that you are not improving or are in fact getting worse before you can make it to your follow-up appointment, please do not hesitate to call 911 and come back for further evaluation.  Darel Hong, MD  Results for orders placed or performed during the hospital encounter of 10/31/18  Urinalysis, Complete w Microscopic  Result Value Ref Range   Color, Urine YELLOW (A) YELLOW   APPearance CLEAR (A) CLEAR   Specific Gravity, Urine 1.016 1.005 - 1.030   pH 5.0 5.0 - 8.0   Glucose, UA 50 (A) NEGATIVE mg/dL   Hgb urine dipstick SMALL (A) NEGATIVE   Bilirubin Urine NEGATIVE NEGATIVE   Ketones, ur NEGATIVE NEGATIVE mg/dL   Protein, ur NEGATIVE NEGATIVE mg/dL   Nitrite NEGATIVE NEGATIVE   Leukocytes, UA MODERATE (A) NEGATIVE   RBC / HPF 0-5 0 - 5 RBC/hpf   WBC, UA 6-10 0 - 5 WBC/hpf   Bacteria, UA NONE SEEN NONE SEEN   Squamous Epithelial / LPF 0-5 0 - 5   Mucus PRESENT   CBC  Result Value Ref Range   WBC 6.9 4.0 - 10.5 K/uL   RBC 5.49 (H) 3.87 - 5.11 MIL/uL   Hemoglobin 14.1 12.0 - 15.0 g/dL   HCT 42.3 36.0 - 46.0 %   MCV 77.0 (L) 80.0 - 100.0 fL   MCH 25.7 (L) 26.0 - 34.0 pg   MCHC 33.3 30.0 - 36.0 g/dL   RDW 12.7 11.5 - 15.5 %   Platelets 286 150 - 400 K/uL   nRBC 0.0 0.0 - 0.2 %  Comprehensive metabolic panel  Result Value Ref Range   Sodium 136 135 - 145 mmol/L   Potassium 4.0 3.5 - 5.1 mmol/L   Chloride 99 98 - 111 mmol/L   CO2 29 22 - 32  mmol/L   Glucose, Bld 189 (H) 70 - 99 mg/dL   BUN 15 6 - 20 mg/dL   Creatinine, Ser 0.42 (L) 0.44 - 1.00 mg/dL   Calcium 9.4 8.9 - 10.3 mg/dL   Total Protein 7.2 6.5 - 8.1 g/dL   Albumin 4.2 3.5 - 5.0 g/dL   AST 17 15 - 41 U/L   ALT 22 0 - 44 U/L   Alkaline Phosphatase 107 38 - 126 U/L   Total Bilirubin 0.4 0.3 - 1.2 mg/dL   GFR calc non Af Amer >60 >60 mL/min   GFR calc Af Amer >60 >60 mL/min   Anion gap 8 5 - 15   Ct Renal Stone Study  Result Date: 10/31/2018 CLINICAL DATA:  57 year old female with left flank pain. EXAM: CT ABDOMEN AND PELVIS WITHOUT CONTRAST TECHNIQUE: Multidetector CT imaging of the abdomen and pelvis was performed following the standard protocol without IV contrast. COMPARISON:  CT of the abdomen pelvis dated 09/05/2018 FINDINGS: Evaluation of this exam is limited in the absence of intravenous contrast. Lower chest: The visualized lung bases are clear. No intra-abdominal  free air or free fluid. Hepatobiliary: There is mild fatty infiltration of the liver. No intrahepatic biliary ductal dilatation. Cholecystectomy. Pancreas: Unremarkable. No pancreatic ductal dilatation or surrounding inflammatory changes. Spleen: Top-normal spleen size measuring 13 cm in length. Adrenals/Urinary Tract: The adrenal glands are unremarkable. Slight lobulated appearance of the left renal contour. There is no hydronephrosis or nephrolithiasis on either side. The visualized ureters and urinary bladder appear unremarkable. Stomach/Bowel: Extensive colonic diverticulosis without active inflammatory changes. There is no bowel obstruction or active inflammation. The appendix is normal. Vascular/Lymphatic: The abdominal aorta and IVC are grossly unremarkable on this noncontrast CT. No portal venous gas. There is no adenopathy. Reproductive: Hysterectomy. No pelvic mass. Other: None Musculoskeletal: No acute or significant osseous findings. IMPRESSION: 1. No acute intra-abdominal or pelvic pathology. No  hydronephrosis or nephrolithiasis. 2. Fatty liver. 3. Colonic diverticulosis. No bowel obstruction or active inflammation. Normal appendix. Electronically Signed   By: Anner Crete M.D.   On: 10/31/2018 02:05

## 2018-11-01 LAB — URINE CULTURE

## 2018-11-05 DIAGNOSIS — I10 Essential (primary) hypertension: Secondary | ICD-10-CM | POA: Diagnosis not present

## 2018-11-05 DIAGNOSIS — E119 Type 2 diabetes mellitus without complications: Secondary | ICD-10-CM | POA: Diagnosis not present

## 2018-11-05 DIAGNOSIS — Z87448 Personal history of other diseases of urinary system: Secondary | ICD-10-CM | POA: Diagnosis not present

## 2018-11-05 DIAGNOSIS — Z23 Encounter for immunization: Secondary | ICD-10-CM | POA: Diagnosis not present

## 2018-11-11 ENCOUNTER — Ambulatory Visit: Payer: Medicare HMO | Admitting: Obstetrics & Gynecology

## 2018-11-13 ENCOUNTER — Ambulatory Visit (INDEPENDENT_AMBULATORY_CARE_PROVIDER_SITE_OTHER): Payer: Medicare HMO | Admitting: Obstetrics & Gynecology

## 2018-11-13 ENCOUNTER — Encounter: Payer: Self-pay | Admitting: Obstetrics & Gynecology

## 2018-11-13 VITALS — BP 120/80 | Ht 62.0 in | Wt 186.0 lb

## 2018-11-13 DIAGNOSIS — N816 Rectocele: Secondary | ICD-10-CM

## 2018-11-13 DIAGNOSIS — D219 Benign neoplasm of connective and other soft tissue, unspecified: Secondary | ICD-10-CM

## 2018-11-13 DIAGNOSIS — Z1239 Encounter for other screening for malignant neoplasm of breast: Secondary | ICD-10-CM

## 2018-11-13 MED ORDER — ESTRADIOL 0.075 MG/24HR TD PTTW: 1.0000 | MEDICATED_PATCH | TRANSDERMAL | 12 refills | Status: DC

## 2018-11-13 NOTE — Progress Notes (Signed)
Postoperative Follow-up Patient presents post op from [place] for fibroids and pelvic relaxation, 6 weeks ago.  Subjective: Patient reports marked improvement in her preop symptoms. Eating a regular diet without difficulty. The patient is not having any pain.  Activity: normal activities of daily living. Patient reports additional symptom's since surgery of None.  Objective: BP 120/80   Ht 5\' 2"  (1.575 m)   Wt 186 lb (84.4 kg)   LMP 09/22/2017   BMI 34.02 kg/m  Physical Exam Constitutional:      General: She is not in acute distress.    Appearance: She is well-developed.  Genitourinary:     Pelvic exam was performed with patient supine.     Vagina and rectum normal.     No vaginal erythema or bleeding.     No right or left adnexal mass present.     Right adnexa not tender.     Left adnexa not tender.     Genitourinary Comments: Cervix and uterus absent. Vaginal cuff healing well.  Cardiovascular:     Rate and Rhythm: Normal rate.  Pulmonary:     Effort: Pulmonary effort is normal.  Abdominal:     General: There is no distension.     Palpations: Abdomen is soft.     Tenderness: There is no abdominal tenderness.     Comments: Incision healing well.  Musculoskeletal: Normal range of motion.  Neurological:     Mental Status: She is alert and oriented to person, place, and time.     Cranial Nerves: No cranial nerve deficit.  Skin:    General: Skin is warm and dry.   Assessment: s/p :  posterior colporrhaphy and total laparoscopic hysterectomy with bilateral salpingectomy progressing well  Plan: Patient has done well after surgery with no apparent complications.  I have discussed the post-operative course to date, and the expected progress moving forward.  The patient understands what complications to be concerned about.  I will see the patient in routine follow up, or sooner if needed.    Activity plan: No restriction.  ERT discussed as having worse hot  flashes.    Rx patch    HRT informed consent: I have discussed HRT with the patient in detail.  The risk/benefits of it were reviewed.  She understands that during menopause Estrogen decreases dramatically and that this results in an increased risk of cardiovascular disease as well as osteoporosis.  We have also discussed the fact that hot flashes often result from a decrease in Estrogen, and that by replacing Estrogen, they can often be alleviated.  We have discussed skin, vaginal and urinary tract changes that may also take place from this drop in Estrogen.  Emotional changes have also been linked to Estrogen and we have briefly discussed this.  The benefits of HRT including decrease in hot flashes, vaginal dryness, and osteoporosis were discussed.  The emotional benefit and a possible change in her cardiovascular risk profile was also reviewed.  The risks associated with Hormone Replacement Therapy were also reviewed.  The use of unopposed Estrogen and its relationship to endometrial cancer was discussed.  The addition of Progesterone and its beneficial effect on endometrial cancer was also noted.  The fact that there has been no consistent definitive studies showing an increase in breast cancer in women who use HRT was discussed with the patient.  The possible side effects including breast tenderness, fluid retention, mood changes and vaginal bleeding were discussed.  The patient was informed  that this is an elective medication and that she may choose not to take Hormone Replacement Therapy.  Literature on HRT was given, and I believe that after answering all of the patient's questions, she has an adequate and informed understanding of HRT.  Special emphasis on the WHI study, as well as several studies since that pertaining to the risks and benefits of estrogen replacement therapy were compared.  The possible limitations of these studies were discussed including the age stratification of the WHI study.  The  possible role of Progesterone in these studies was discussed in detail.  I believe that the patient has an informed knowledge of the risks and benefits of HRT.  I have specifically discussed WHI findings and current updates.  Different type of hormone formulation and methods of taking hormone replacement therapy discussed.   MMG recommended  Hoyt Koch 11/13/2018, 1:31 PM

## 2018-11-13 NOTE — Patient Instructions (Addendum)
Mammogram every year    Call [phone] to schedule at [facility] Colonoscopy every 10 years Labs yearly (with PCP)  Estradiol skin patches What is this medicine? ESTRADIOL (es tra DYE ole) skin patches contain an estrogen. It is mostly used as hormone replacement in menopausal women. It helps to treat hot flashes and prevent osteoporosis. It is also used to treat women with low estrogen levels or those who have had their ovaries removed. This medicine may be used for other purposes; ask your health care provider or pharmacist if you have questions. COMMON BRAND NAME(S): Alora, Climara, DOTTI, Esclim, Estraderm, FemPatch, Menostar, Minivelle, Vivelle, Vivelle-Dot What should I tell my health care provider before I take this medicine? They need to know if you have any of these conditions: -abnormal vaginal bleeding -blood vessel disease or blood clots -breast, cervical, endometrial, ovarian, liver, or uterine cancer -dementia -diabetes -gallbladder disease -heart disease or recent heart attack -high blood pressure -high cholesterol -high level of calcium in the blood -hysterectomy -kidney disease -liver disease -migraine headaches -protein C deficiency -protein S deficiency -stroke -systemic lupus erythematosus (SLE) -tobacco smoker -an unusual or allergic reaction to estrogens, other hormones, medicines, foods, dyes, or preservatives -pregnant or trying to get pregnant -breast-feeding How should I use this medicine? This medicine is for external use only. Follow the directions on the prescription label. Tear open the pouch, do not use scissors. Remove the stiff protective liner covering the adhesive. Try not to touch the adhesive. Apply the patch, sticky side to the skin, to an area that is clean, dry and hairless. Avoid injured, irritated, calloused, or scarred areas. Do not apply the skin patches to your breasts or around the waistline. Use a different site each time to prevent  skin irritation. Do not cut or trim the patch. Do not stop using except on the advice of your doctor or health care professional. Do not wear more than one patch at a time unless you are told to do so by your doctor or health care professional. Contact your pediatrician regarding the use of this medicine in children. Special care may be needed. A patient package insert for the product will be given with each prescription and refill. Read this sheet carefully each time. The sheet may change frequently. Overdosage: If you think you have taken too much of this medicine contact a poison control center or emergency room at once. NOTE: This medicine is only for you. Do not share this medicine with others. What if I miss a dose? If you miss a dose, apply it as soon as you can. If it is almost time for your next dose, apply only that dose. Do not apply double or extra doses. What may interact with this medicine? Do not take this medicine with any of the following medications: -aromatase inhibitors like aminoglutethimide, anastrozole, exemestane, letrozole, testolactone This medicine may also interact with the following medications: -carbamazepine -certain antibiotics used to treat infections -certain barbiturates used for inducing sleep or treating seizures -grapefruit juice -medicines for fungus infections like itraconazole and ketoconazole -raloxifene or tamoxifen -rifabutin, rifampin, or rifapentine -ritonavir -St. John's Wort This list may not describe all possible interactions. Give your health care provider a list of all the medicines, herbs, non-prescription drugs, or dietary supplements you use. Also tell them if you smoke, drink alcohol, or use illegal drugs. Some items may interact with your medicine. What should I watch for while using this medicine? Visit your doctor or health care professional for regular checks  on your progress. You will need a regular breast and pelvic exam and Pap smear  while on this medicine. You should also discuss the need for regular mammograms with your health care professional, and follow his or her guidelines for these tests. This medicine can make your body retain fluid, making your fingers, hands, or ankles swell. Your blood pressure can go up. Contact your doctor or health care professional if you feel you are retaining fluid. If you have any reason to think you are pregnant, stop taking this medicine right away and contact your doctor or health care professional. Smoking increases the risk of getting a blood clot or having a stroke while you are taking this medicine, especially if you are more than 55 years old. You are strongly advised not to smoke. If you wear contact lenses and notice visual changes, or if the lenses begin to feel uncomfortable, consult your eye doctor or health care professional. This medicine can increase the risk of developing a condition (endometrial hyperplasia) that may lead to cancer of the lining of the uterus. Taking progestins, another hormone drug, with this medicine lowers the risk of developing this condition. Therefore, if your uterus has not been removed (by a hysterectomy), your doctor may prescribe a progestin for you to take together with your estrogen. You should know, however, that taking estrogens with progestins may have additional health risks. You should discuss the use of estrogens and progestins with your health care professional to determine the benefits and risks for you. If you are going to have surgery or an MRI, you may need to stop taking this medicine. Consult your health care professional for advice before you schedule the surgery. Contact with water while you are swimming, using a sauna, bathing, or showering may cause the patch to fall off. If your patch falls off reapply it. If you cannot reapply the patch, apply a new patch to another area and continue to follow your usual dose schedule. What side effects  may I notice from receiving this medicine? Side effects that you should report to your doctor or health care professional as soon as possible: -allergic reactions like skin rash, itching or hives, swelling of the face, lips, or tongue -breast tissue changes or discharge -changes in vision -chest pain -confusion, trouble speaking or understanding -dark urine -general ill feeling or flu-like symptoms -light-colored stools -nausea, vomiting -pain, swelling, warmth in the leg -right upper belly pain -severe headaches -shortness of breath -sudden numbness or weakness of the face, arm or leg -trouble walking, dizziness, loss of balance or coordination -unusual vaginal bleeding -yellowing of the eyes or skin Side effects that usually do not require medical attention (report to your doctor or health care professional if they continue or are bothersome): -hair loss -increased hunger or thirst -increased urination -symptoms of vaginal infection like itching, irritation or unusual discharge -unusually weak or tired This list may not describe all possible side effects. Call your doctor for medical advice about side effects. You may report side effects to FDA at 1-800-FDA-1088. Where should I keep my medicine? Keep out of the reach of children. Store at room temperature below 30 degrees C (86 degrees F). Do not store any patches that have been removed from their protective pouch. Throw away any unused medicine after the expiration date. Dispose of used patches properly. Since used patches may still contain active hormones, fold the patch in half so that it sticks to itself prior to disposal. NOTE: This sheet is  a summary. It may not cover all possible information. If you have questions about this medicine, talk to your doctor, pharmacist, or health care provider.  2019 Elsevier/Gold Standard (2016-06-06 12:58:11)

## 2018-12-13 DIAGNOSIS — J069 Acute upper respiratory infection, unspecified: Secondary | ICD-10-CM | POA: Diagnosis not present

## 2018-12-13 DIAGNOSIS — R509 Fever, unspecified: Secondary | ICD-10-CM | POA: Diagnosis not present

## 2019-01-29 ENCOUNTER — Emergency Department: Payer: Medicare HMO

## 2019-01-29 ENCOUNTER — Other Ambulatory Visit: Payer: Self-pay

## 2019-01-29 ENCOUNTER — Emergency Department
Admission: EM | Admit: 2019-01-29 | Discharge: 2019-01-30 | Disposition: A | Discharge status: Home / Self Care | Payer: Medicare HMO | Attending: Emergency Medicine | Admitting: Emergency Medicine

## 2019-01-29 DIAGNOSIS — I10 Essential (primary) hypertension: Secondary | ICD-10-CM | POA: Insufficient documentation

## 2019-01-29 DIAGNOSIS — E119 Type 2 diabetes mellitus without complications: Secondary | ICD-10-CM | POA: Diagnosis not present

## 2019-01-29 DIAGNOSIS — E1165 Type 2 diabetes mellitus with hyperglycemia: Secondary | ICD-10-CM | POA: Diagnosis not present

## 2019-01-29 DIAGNOSIS — R079 Chest pain, unspecified: Secondary | ICD-10-CM | POA: Diagnosis not present

## 2019-01-29 DIAGNOSIS — Z79899 Other long term (current) drug therapy: Secondary | ICD-10-CM | POA: Insufficient documentation

## 2019-01-29 DIAGNOSIS — Z7984 Long term (current) use of oral hypoglycemic drugs: Secondary | ICD-10-CM | POA: Diagnosis not present

## 2019-01-29 DIAGNOSIS — R0789 Other chest pain: Secondary | ICD-10-CM | POA: Diagnosis not present

## 2019-01-29 DIAGNOSIS — E039 Hypothyroidism, unspecified: Secondary | ICD-10-CM | POA: Diagnosis not present

## 2019-01-29 LAB — BASIC METABOLIC PANEL
ANION GAP: 13 (ref 5–15)
BUN: 13 mg/dL (ref 6–20)
CHLORIDE: 99 mmol/L (ref 98–111)
CO2: 26 mmol/L (ref 22–32)
Calcium: 9.8 mg/dL (ref 8.9–10.3)
Creatinine, Ser: 0.37 mg/dL — ABNORMAL LOW (ref 0.44–1.00)
GFR calc non Af Amer: >60 mL/min (ref >60)
Glucose, Bld: 352 mg/dL — ABNORMAL HIGH (ref 70–99)
POTASSIUM: 3.9 mmol/L (ref 3.5–5.1)
Sodium: 138 mmol/L (ref 135–145)

## 2019-01-29 LAB — URINALYSIS, COMPLETE (UACMP) WITH MICROSCOPIC
BACTERIA UA: NONE SEEN
BILIRUBIN URINE: NEGATIVE
Glucose, UA: >=500 mg/dL — AB
Hgb urine dipstick: NEGATIVE
KETONES UR: 5 mg/dL — AB
LEUKOCYTE UA: NEGATIVE
NITRITE: NEGATIVE
PH: 5 (ref 5.0–8.0)
Protein, ur: NEGATIVE mg/dL
Specific Gravity, Urine: 1.028 (ref 1.005–1.030)

## 2019-01-29 LAB — CBC
HEMATOCRIT: 45.7 % (ref 36.0–46.0)
HEMOGLOBIN: 16.1 g/dL — AB (ref 12.0–15.0)
MCH: 26.5 pg (ref 26.0–34.0)
MCHC: 35.2 g/dL (ref 30.0–36.0)
MCV: 75.2 fL — AB (ref 80.0–100.0)
NRBC: 0 % (ref 0.0–0.2)
Platelets: 261 10*3/uL (ref 150–400)
RBC: 6.08 MIL/uL — AB (ref 3.87–5.11)
RDW: 14 % (ref 11.5–15.5)
WBC: 5.7 10*3/uL (ref 4.0–10.5)

## 2019-01-29 LAB — TROPONIN I: Troponin I: <0.03 ng/mL (ref <0.03)

## 2019-01-29 MED ORDER — ASPIRIN 81 MG PO CHEW
324.0000 mg | CHEWABLE_TABLET | Freq: Once | ORAL | Status: AC
  Administered 2019-01-29: 324 mg via ORAL
  Filled 2019-01-29: qty 4

## 2019-01-29 MED ORDER — SODIUM CHLORIDE 0.9% FLUSH
3.0000 mL | Freq: Once | INTRAVENOUS | Status: AC
  Administered 2019-01-29: 3 mL via INTRAVENOUS

## 2019-01-29 NOTE — ED Notes (Signed)
Patient states having chest pain past few days centralized across chest.

## 2019-01-29 NOTE — ED Provider Notes (Signed)
[facility] Emergency Department Provider Note  ____________________________________________  Time seen: Approximately 11:10 PM  I have reviewed the triage vital signs and the nursing notes.   HISTORY  Chief Complaint Chest Pain   HPI Kristin Wilson is a 58 y.o. female with a history of diabetes, hypertension, hypothyroidism who presents for evaluation of chest pain.  Patient reports 2 to 3 days of several daily episodes of chest pain.  She describes the pain as tightness located in the center of her chest, lasting a few seconds at a time.  Episodes happen at rest and with exertion. No shortness of breath, dizziness or vomiting.  She has had some nausea.  No abdominal pain.  Patient denies any personal history of heart attacks.  Reports history of heart problems in her grandmother.  No personal or family history of blood clots, recent travel immobilization, leg pain or swelling, hemoptysis, or exogenous hormones.  Patient denies cough, congestion or fever.  No chest pain at this time.  Past Medical History:  Diagnosis Date  . Bronchitis   . Diabetes mellitus without complication ([place])   . Hypertension   . Hyperthyroidism   . Osteoarthritis   . Pyelonephritis   . Sciatica   . Urinary tract infection     Patient Active Problem List   Diagnosis Date Noted  . Fibroid 09/09/2018  . Pelvic pain 09/09/2018  . Rectocele 09/09/2018  . Sepsis ([place]) 09/08/2017  . Chest discomfort 10/08/2015  . DOE (dyspnea on exertion) 10/08/2015  . Hyperglycemia 10/08/2015    Past Surgical History:  Procedure Laterality Date  . ANTERIOR AND POSTERIOR REPAIR WITH SACROSPINOUS FIXATION N/A 09/24/2018   Procedure: POSTERIOR REPAIR;  Surgeon: Gae Dry, MD;  Location: [facility] ORS;  Service: Gynecology;  Laterality: N/A;  . cholecystecomy    . CHOLECYSTECTOMY    . CYSTOSCOPY N/A 09/24/2018   Procedure: CYSTOSCOPY;  Surgeon: Gae Dry, MD;  Location: [facility] ORS;   Service: Gynecology;  Laterality: N/A;  . LAPAROSCOPIC HYSTERECTOMY Bilateral 09/24/2018   Procedure: HYSTERECTOMY TOTAL LAPAROSCOPIC- BSO;  Surgeon: Gae Dry, MD;  Location: [facility] ORS;  Service: Gynecology;  Laterality: Bilateral;  . TUBAL LIGATION      Prior to Admission medications   Medication Sig Start Date End Date Taking? Authorizing Provider  albuterol (PROVENTIL HFA;VENTOLIN HFA) 108 (90 Base) MCG/ACT inhaler Inhale 2 puffs into the lungs every 4 (four) hours as needed for wheezing or shortness of breath. Patient not taking: Reported on 11/13/2018 10/20/17   Paulette Blanch, MD  chlorpheniramine-HYDROcodone [facility] ER) 10-8 MG/5ML SUER Take 5 mLs by mouth 2 (two) times daily. Patient not taking: Reported on 09/19/2018 10/20/17   Paulette Blanch, MD  ciprofloxacin (CIPRO) 500 MG tablet Take 1 tablet (500 mg total) by mouth 2 (two) times daily. Patient not taking: Reported on 09/19/2018 09/11/17   Fritzi Mandes, MD  cyclobenzaprine (FLEXERIL) 5 MG tablet Take 1 tablet (5 mg total) by mouth 3 (three) times daily as needed for muscle spasms. 08/23/17   [name], [name], PA-C  estradiol (VIVELLE-DOT) 0.075 MG/24HR Place 1 patch onto the skin 2 (two) times a week. 11/14/18   Gae Dry, MD  glipiZIDE (GLUCOTROL XL) 10 MG 24 hr tablet Take 10 mg by mouth daily. 06/17/18   [provider]  ibuprofen (ADVIL,MOTRIN) 600 MG tablet Take 1 tablet (600 mg total) by mouth every 8 (eight) hours as needed. 09/05/18   Darel Hong, MD  ibuprofen (  ADVIL,MOTRIN) 800 MG tablet Take 800 mg by mouth every 8 (eight) hours as needed.    [provider]  meclizine (ANTIVERT) 12.5 MG tablet Take 1 tablet (12.5 mg total) by mouth 3 (three) times daily as needed for dizziness. Patient not taking: Reported on 09/19/2018 08/23/17   [name], [name], PA-C  metFORMIN (GLUCOPHAGE) 500 MG tablet Take 500 mg by mouth 2 (two) times daily. 10/27/16 09/19/18  [provider]  methimazole (TAPAZOLE) 10 MG tablet Take 1 tablet (10 mg total) by mouth 2 (two) times daily. Patient taking differently: Take 20 mg by mouth every morning.  10/08/15   [name], [name], MD  methocarbamol (ROBAXIN) 500 MG tablet Take 1 tablet (500 mg total) by mouth 4 (four) times daily. Patient not taking: Reported on 09/19/2018 08/20/17   [name], [name], PA-C  methylPREDNISolone (MEDROL DOSEPAK) 4 MG TBPK tablet Take as directed Patient not taking: Reported on 09/19/2018 10/20/17   Paulette Blanch, MD  metoprolol tartrate (LOPRESSOR) 25 MG tablet Take 1 tablet (25 mg total) by mouth 2 (two) times daily. 10/08/15   [name], [name], MD  oxyCODONE-acetaminophen (PERCOCET/ROXICET) 5-325 MG tablet Take 1 tablet by mouth every 4 (four) hours as needed for moderate pain. 09/24/18   Gae Dry, MD  phenazopyridine (PYRIDIUM) 200 MG tablet Take 1 tablet (200 mg total) by mouth 3 (three) times daily as needed for pain. 10/31/18 10/31/19  Darel Hong, MD  traMADol (ULTRAM) 50 MG tablet Take 1 tablet (50 mg total) by mouth every 6 (six) hours as needed. 01/19/16   Loney Hering, MD    Allergies Patient has no known allergies.  Family History  Problem Relation Age of Onset  . Heart failure Mother   . Diabetic kidney disease Paternal Grandmother   . CAD Paternal Grandmother     Social History Social History   Tobacco Use  . Smoking status: Never Smoker  . Smokeless tobacco: Never Used  Substance Use Topics  . Alcohol use: No  . Drug use: No    Review of Systems  Constitutional: Negative for fever. Eyes: Negative for visual changes. ENT: Negative for sore throat. Neck: No neck pain  Cardiovascular: + chest pain. Respiratory: Negative for shortness of breath. Gastrointestinal: Negative for abdominal pain, vomiting or diarrhea. Genitourinary: Negative for dysuria. Musculoskeletal: Negative for back pain. Skin: Negative for rash. Neurological:  Negative for headaches, weakness or numbness. Psych: No SI or HI  ____________________________________________   PHYSICAL EXAM:  VITAL SIGNS: ED Triage Vitals  Enc Vitals Group     BP 01/29/19 2158 (!) 168/91     Pulse Rate 01/29/19 2158 89     Resp 01/29/19 2158 18     Temp 01/29/19 2158 98.3 F (36.8 C)     Temp Source 01/29/19 2158 Oral     SpO2 01/29/19 2158 94 %     Weight 01/29/19 2159 185 lb (83.9 kg)     Height 01/29/19 2159 5\' 2"  (1.575 m)     Head Circumference --      Peak Flow --      Pain Score 01/29/19 2159 4     Pain Loc --      Pain Edu? --      Excl. in [place]? --     Constitutional: Alert and oriented. Well appearing and in no apparent distress. HEENT:      Head: Normocephalic and atraumatic.         Eyes: Conjunctivae  are normal. Sclera is non-icteric.       Mouth/Throat: Mucous membranes are moist.       Neck: Supple with no signs of meningismus. Cardiovascular: Regular rate and rhythm. No murmurs, gallops, or rubs. 2+ symmetrical distal pulses are present in all extremities. No JVD. Respiratory: Normal respiratory effort. Lungs are clear to auscultation bilaterally. No wheezes, crackles, or rhonchi.  Gastrointestinal: Soft, non tender, and non distended with positive bowel sounds. No rebound or guarding. Musculoskeletal: Nontender with normal range of motion in all extremities. No edema, cyanosis, or erythema of extremities. Neurologic: Normal speech and language. Face is symmetric. Moving all extremities. No gross focal neurologic deficits are appreciated. Skin: Skin is warm, dry and intact. No rash noted. Psychiatric: Mood and affect are normal. Speech and behavior are normal.  ____________________________________________   LABS (all labs ordered are listed, but only abnormal results are displayed)  Labs Reviewed  BASIC METABOLIC PANEL - Abnormal; Notable for the following components:      Result Value   Glucose, Bld 352 (*)    Creatinine, Ser  0.37 (*)    All other components within normal limits  CBC - Abnormal; Notable for the following components:   RBC 6.08 (*)    Hemoglobin 16.1 (*)    MCV 75.2 (*)    All other components within normal limits  URINALYSIS, COMPLETE (UACMP) WITH MICROSCOPIC - Abnormal; Notable for the following components:   Color, Urine YELLOW (*)    APPearance CLEAR (*)    Glucose, UA >=500 (*)    Ketones, ur 5 (*)    All other components within normal limits  TROPONIN I  TROPONIN I  CBG MONITORING, ED   ____________________________________________  EKG  ED ECG REPORT I, Rudene Re, the attending physician, personally viewed and interpreted this ECG.  Normal sinus rhythm, rate of 73, normal intervals, normal axis, no ST elevations or depressions.  No changes when compared to prior from 2019  02:06 -normal sinus rhythm, rate of 70, normal intervals, normal axis, no ST elevations or depressions.  Normal EKG. ____________________________________________  RADIOLOGY  I have personally reviewed the images performed during this visit and I agree with the Radiologist's read.   Interpretation by Radiologist:  Dg Chest 2 View  Result Date: 01/29/2019 CLINICAL DATA:  Chest pain. EXAM: CHEST - 2 VIEW COMPARISON:  10/19/2017 FINDINGS: The cardiomediastinal contours are normal. The lungs are clear. Pulmonary vasculature is normal. No consolidation, pleural effusion, or pneumothorax. No acute osseous abnormalities are seen. IMPRESSION: No acute chest findings. Unchanged radiographic appearance of the chest from 2018. Electronically Signed   By: Keith Rake M.D.   On: 01/29/2019 22:30     ____________________________________________   PROCEDURES  Procedure(s) performed: None Procedures Critical Care performed:  None ____________________________________________   INITIAL IMPRESSION / ASSESSMENT AND PLAN / ED COURSE  58 y.o. female with a history of diabetes, hypertension, hypothyroidism  who presents for evaluation of several daily episodes of chest tightness lasting seconds at a time with no other associated symptoms. Heart score 2. EKG non ischemic. Will further stratify with troponin x 2. Will give ASA. Low suspicion for PE with intermittent symptoms, no tachycardia, tachypnea, or hypoxia.  Low suspicion for dissection with no neurological deficits, intermittent symptoms, no extreme hypertension, pain not radiating to the back, normal mediastinum silhouette on chest x-ray.  No evidence of pneumonia or pneumothorax.    _________________________ 2:09 AM on 01/30/2019 -----------------------------------------  Troponin x2-.  EKG x2 with no acute ischemic  changes.  Labs showing hyperglycemia with no evidence of DKA.  Patient was given a liter of fluids.  She is tolerating p.o.  Will discharge home with follow-up with cardiology for further evaluation.  Discussed and return precautions peer   As part of my medical decision making, I reviewed the following data within the [facility] notes reviewed and incorporated, Labs reviewed , EKG interpreted , Old EKG reviewed, Old chart reviewed, Radiograph reviewed , Notes from prior ED visits and [place] Controlled Substance Database    Pertinent labs & imaging results that were available during my care of the patient were reviewed by me and considered in my medical decision making (see chart for details).    ____________________________________________   FINAL CLINICAL IMPRESSION(S) / ED DIAGNOSES  Final diagnoses:  Chest pain, unspecified type  Type 2 diabetes mellitus with hyperglycemia, without long-term current use of insulin (HCC)      NEW MEDICATIONS STARTED DURING THIS VISIT:  ED Discharge Orders    None       Note:  This document was prepared using Dragon voice recognition software and may include unintentional dictation errors.    Rudene Re, MD 01/30/19 [phone]

## 2019-01-29 NOTE — ED Triage Notes (Signed)
Pt to the er for chest pain that started yesterday and then again today. Pt is unsure if it is a pulled muscle. Pt has a hx of high blood pressure.

## 2019-01-30 ENCOUNTER — Other Ambulatory Visit: Payer: Self-pay

## 2019-01-30 DIAGNOSIS — R079 Chest pain, unspecified: Secondary | ICD-10-CM | POA: Diagnosis not present

## 2019-01-30 LAB — TROPONIN I

## 2019-01-30 LAB — GLUCOSE, CAPILLARY: Glucose-Capillary: 272 mg/dL — ABNORMAL HIGH (ref 70–99)

## 2019-01-30 MED ORDER — SODIUM CHLORIDE 0.9 % IV BOLUS
1000.0000 mL | Freq: Once | INTRAVENOUS | Status: AC
  Administered 2019-01-30: 1000 mL via INTRAVENOUS

## 2019-01-30 NOTE — Discharge Instructions (Signed)

## 2019-04-25 ENCOUNTER — Telehealth: Payer: Self-pay | Admitting: *Deleted

## 2019-04-25 NOTE — Telephone Encounter (Signed)

## 2019-04-28 NOTE — Progress Notes (Signed)
New Outpatient Visit Date: 04/30/2019  Referring Provider: Rudene Re, MD [facility] Emergency Department  Chief Complaint: Chest pain  HPI:  Kristin Wilson is a 58 y.o. female who is being seen today for the evaluation of chest pain at the request of Dr. Alfred Levins. She has a history of hypertension, type 2 diabetes mellitus, and hyperthyroidism.  She presented to the [facility] ED in early March with a 2-3 day history of intermittent chest pain lasting a few seconds at a time.  ED workup was unrevealing, including normal EKG and negative serial troponins.  [name] was admitted at [facility] in 2016 for chest pain and shortness of breath.  Echo at that time showed normal LVEF with grade 2 diastolic dysfunction.  Mildly elevated LVOT gradient was noted.  Myocardial perfusion stress test was low risk with a small, fixed defect involving the mid anteroseptal segment of questionable clinical significance.  Today, [name] reports that she has had a few additional episodes of chest pain since her ED visit in March.  She describes it as a substernal pressure without radiation or associated symptoms.  It usually lasts a few seconds to minutes and it most commonly occurs when she is stressed.  She has noticed it on occasion when she walks, though not every time she exerts herself.  She believes that symptoms she experienced back in 2016 when she had the aforementioned echo and stress test were different.  She has not taken any medication for the pain.  She denies habitation and lightheadedness.  She has occasional dependent edema, worse when it is warm outside.  She has not had any orthopnea or PND.  She reports being compliant with her medications.  She also began taking aspirin 81 mg daily about a week ago.   --------------------------------------------------------------------------------------------------  Cardiovascular History & Procedures: Cardiovascular Problems:  Chest pain and shortness of breath   Risk Factors:  Hypertension, diabetes mellitus  Cath/PCI:  None  CV Surgery:  None  EP Procedures and Devices:  None  Non-Invasive Evaluation(s):  Pharmacologic MPI (10/08/2015): Low risk study with small, fixed mid anteroseptal defect of questionable significance.  No significant ischemia.  LVEF 69%.  Transthoracic echocardiogram (10/08/2015): Normal LV size and wall thickness.  LVEF 60-65% with normal wall motion.  Grade 2 diastolic dysfunction.  Mild left atrial enlargement.  Normal RV size and function.  Mildly elevated LVOT gradient noted (15 mmHg).  Recent CV Pertinent Labs: Lab Results  Component Value Date   CHOL 122 10/08/2015   HDL 31 (L) 10/08/2015   LDLCALC 64 10/08/2015   TRIG 136 10/08/2015   CHOLHDL 3.9 10/08/2015   INR 1.10 09/23/2018   INR 1.1 10/11/2014   BNP 21.7 10/08/2015   K 3.9 01/29/2019   K 4.0 10/11/2014   BUN 13 01/29/2019   BUN 13 10/11/2014   CREATININE 0.37 (L) 01/29/2019   CREATININE 0.55 (L) 10/11/2014    --------------------------------------------------------------------------------------------------  Past Medical History:  Diagnosis Date  . Bronchitis   . Diabetes mellitus without complication ([place])   . Hypertension   . Hyperthyroidism   . Osteoarthritis   . Pyelonephritis   . Sciatica   . Urinary tract infection     Past Surgical History:  Procedure Laterality Date  . ANTERIOR AND POSTERIOR REPAIR WITH SACROSPINOUS FIXATION N/A 09/24/2018   Procedure: POSTERIOR REPAIR;  Surgeon: Gae Dry, MD;  Location: [facility] ORS;  Service: Gynecology;  Laterality: N/A;  . cholecystecomy    . CHOLECYSTECTOMY    .  CYSTOSCOPY N/A 09/24/2018   Procedure: CYSTOSCOPY;  Surgeon: Gae Dry, MD;  Location: [facility] ORS;  Service: Gynecology;  Laterality: N/A;  . LAPAROSCOPIC HYSTERECTOMY Bilateral 09/24/2018   Procedure: HYSTERECTOMY TOTAL LAPAROSCOPIC- BSO;  Surgeon: Gae Dry, MD;  Location: [facility] ORS;  Service:  Gynecology;  Laterality: Bilateral;  . TUBAL LIGATION      Current Meds  Medication Sig  . aspirin EC 81 MG tablet Take 81 mg by mouth daily.  [name] estradiol (VIVELLE-DOT) 0.075 MG/24HR Place 1 patch onto the skin 2 (two) times a week.  [name] glipiZIDE (GLUCOTROL XL) 10 MG 24 hr tablet Take 10 mg by mouth daily.  [name] ibuprofen (ADVIL,MOTRIN) 800 MG tablet Take 800 mg by mouth every 8 (eight) hours as needed.  . metFORMIN (GLUCOPHAGE) 500 MG tablet Take 500-1,000 mg by mouth 2 (two) times daily.   . methimazole (TAPAZOLE) 10 MG tablet Take 1 tablet (10 mg total) by mouth 2 (two) times daily. (Patient taking differently: Take 20 mg by mouth every morning. )  . metoprolol tartrate (LOPRESSOR) 25 MG tablet Take 1 tablet (25 mg total) by mouth 2 (two) times daily.  [name] oxyCODONE-acetaminophen (PERCOCET/ROXICET) 5-325 MG tablet Take 1 tablet by mouth every 4 (four) hours as needed for moderate pain.    Allergies: Patient has no known allergies.  Social History   Tobacco Use  . Smoking status: Never Smoker  . Smokeless tobacco: Never Used  Substance Use Topics  . Alcohol use: No  . Drug use: No    Family History  Problem Relation Age of Onset  . Coronary artery disease Mother 17  . Diabetic kidney disease Paternal Grandmother   . CAD Paternal Grandmother 2    Review of Systems: A 12-system review of systems was performed and was negative except as noted in the HPI.  --------------------------------------------------------------------------------------------------  Physical Exam: BP 124/78 (BP Location: Left Arm, Patient Position: Sitting, Cuff Size: Normal)   Pulse 76   Ht 5\' 2"  (1.575 m)   Wt 189 lb 12 oz (86.1 kg)   LMP 09/22/2017   BMI 34.71 kg/m   General: NAD. HEENT: No conjunctival pallor or scleral icterus. Moist mucous membranes. OP clear. Neck: Supple without lymphadenopathy, thyromegaly, JVD, or HJR. No carotid bruit. Lungs: Normal work of breathing. Clear to auscultation  bilaterally without wheezes or crackles. Heart: Regular rate and rhythm without murmurs, rubs, or gallops. Non-displaced PMI. Abd: Bowel sounds present. Soft, NT/ND without hepatosplenomegaly Ext: No lower extremity edema. Radial, PT, and DP pulses are 2+ bilaterally Skin: Warm and dry without rash. Neuro: CNIII-XII intact. Strength and fine-touch sensation intact in upper and lower extremities bilaterally. Psych: Normal mood and affect.  EKG: Sinus rhythm without abnormality.  Lab Results  Component Value Date   WBC 5.7 01/29/2019   HGB 16.1 (H) 01/29/2019   HCT 45.7 01/29/2019   MCV 75.2 (L) 01/29/2019   PLT 261 01/29/2019    Lab Results  Component Value Date   NA 138 01/29/2019   K 3.9 01/29/2019   CL 99 01/29/2019   CO2 26 01/29/2019   BUN 13 01/29/2019   CREATININE 0.37 (L) 01/29/2019   GLUCOSE 352 (H) 01/29/2019   ALT 22 10/31/2018    Lab Results  Component Value Date   CHOL 122 10/08/2015   HDL 31 (L) 10/08/2015   LDLCALC 64 10/08/2015   TRIG 136 10/08/2015   CHOLHDL 3.9 10/08/2015     --------------------------------------------------------------------------------------------------  ASSESSMENT AND PLAN: Chest pain: Chest discomfort has  both typical and atypical components, given that it is pressure-like and sometimes comes on with stress or exertion.  However, exertional component is inconsistent.  Pain is also very short and infrequent.  Ms. Priego has multiple cardiac risk factors, including hypertension and diabetes mellitus.  We have discussed further evaluation options and have agreed to obtain a cardiac CTA for further assessment.  I have encouraged her to continue taking low-dose aspirin and metoprolol.  We will defer statin therapy, given that LDL a year ago at [facility] was quite good at 65.  Hypertension: Blood pressure well controlled today.  No medication changes at this time.  Follow-up: To be determined based on results of cardiac CTA.  Nelva Bush, MD 04/30/2019 11:45 AM

## 2019-04-30 ENCOUNTER — Ambulatory Visit (INDEPENDENT_AMBULATORY_CARE_PROVIDER_SITE_OTHER): Payer: Medicare HMO | Admitting: Internal Medicine

## 2019-04-30 ENCOUNTER — Encounter: Payer: Self-pay | Admitting: Internal Medicine

## 2019-04-30 ENCOUNTER — Other Ambulatory Visit: Payer: Self-pay

## 2019-04-30 VITALS — BP 124/78 | HR 76 | Ht 62.0 in | Wt 189.8 lb

## 2019-04-30 DIAGNOSIS — Z01810 Encounter for preprocedural cardiovascular examination: Secondary | ICD-10-CM | POA: Diagnosis not present

## 2019-04-30 DIAGNOSIS — R079 Chest pain, unspecified: Secondary | ICD-10-CM

## 2019-04-30 DIAGNOSIS — I10 Essential (primary) hypertension: Secondary | ICD-10-CM | POA: Diagnosis not present

## 2019-04-30 NOTE — Patient Instructions (Addendum)
Medication Instructions:  Your physician recommends that you continue on your current medications as directed. Please refer to the Current Medication list given to you today.  If you need a refill on your cardiac medications before your next appointment, please call your pharmacy.   Lab work: Your physician recommends that you return for lab work WITH IN [place].  (BMET) - Please go to the [facility]. You will check in at the front desk to the right as you walk into the atrium. Valet Parking is offered if needed.   If you have labs (blood work) drawn today and your tests are completely normal, you will receive your results only by: [name] MyChart Message (if you have MyChart) OR . A paper copy in the mail If you have any lab test that is abnormal or we need to change your treatment, we will call you to review the results.  Testing/Procedures: Your physician has requested that you have cardiac CT. Cardiac computed tomography (CT) is a painless test that uses an x-ray machine to take clear, detailed pictures of your heart. For further information please visit [facility]. Please follow instruction sheet as given.  Please arrive at the [facility] main entrance of [facility] at xx:xx AM (30-45 minutes prior to test start time)  [facility] [place], [place] [address] [phone]  Proceed to the [facility] Radiology Department (First Floor).  Please follow these instructions carefully (unless otherwise directed):   On the Night Before the Test: . Be sure to Drink plenty of water. . Do not consume any caffeinated/decaffeinated beverages or chocolate 12 hours prior to your test. . Do not take any antihistamines 12 hours prior to your test. . If you take Metformin do not take 24 hours prior to test.   On the Day of the Test: . Drink plenty of water. Do not drink any water within one hour of the test. . Do not eat any  food 4 hours prior to the test. . You may take your regular medications prior to the test.  . Take metoprolol (Lopressor) two hours prior to test. . HOLD Furosemide/Hydrochlorothiazide morning of the test.       After the Test: . Drink plenty of water. . After receiving IV contrast, you may experience a mild flushed feeling. This is normal. . On occasion, you may experience a mild rash up to 24 hours after the test. This is not dangerous. If this occurs, you can take Benadryl 25 mg and increase your fluid intake. . If you experience trouble breathing, this can be serious. If it is severe call 911 IMMEDIATELY. If it is mild, please call our office. . If you take any of these medications: Glipizide/Metformin, Avandament, Glucavance, please do not take 48 hours after completing test.    Follow-Up: At [facility], you and your health needs are our priority.  As part of our continuing mission to provide you with exceptional heart care, we have created designated Provider Care Teams.  These Care Teams include your primary Cardiologist (physician) and Advanced Practice Providers (APPs -  Physician Assistants and Nurse Practitioners) who all work together to provide you with the care you need, when you need it. You will need a follow up appointment in  TO BE DETERMINED BASED ON RESULTS.  Please call our office 2 months in advance to schedule this appointment.  You may see DR Harrell Gave END or one of the  following Advanced Practice Providers on your designated Care Team:   Murray Hodgkins, NP Christell Faith, PA-C . Marrianne Mood, PA-C     Coronary Calcium Scan A coronary calcium scan is an imaging test used to look for deposits of calcium and other fatty materials (plaques) in the inner lining of the blood vessels of the heart (coronary arteries). These deposits of calcium and plaques can partly clog and narrow the coronary arteries without producing any symptoms or warning signs. This puts a  person at risk for a heart attack. This test can detect these deposits before symptoms develop. Tell a health care provider about:  Any allergies you have.  All medicines you are taking, including vitamins, herbs, eye drops, creams, and over-the-counter medicines.  Any problems you or family members have had with anesthetic medicines.  Any blood disorders you have.  Any surgeries you have had.  Any medical conditions you have.  Whether you are pregnant or may be pregnant. What are the risks? Generally, this is a safe procedure. However, problems may occur, including:  Harm to a pregnant woman and her unborn baby. This test involves the use of radiation. Radiation exposure can be dangerous to a pregnant woman and her unborn baby. If you are pregnant, you generally should not have this procedure done.  Slight increase in the risk of cancer. This is because of the radiation involved in the test. What happens before the procedure? No preparation is needed for this procedure. What happens during the procedure?   You will undress and remove any jewelry around your neck or chest.  You will put on a hospital gown.  Sticky electrodes will be placed on your chest. The electrodes will be connected to an electrocardiogram (ECG) machine to record a tracing of the electrical activity of your heart.  A CT scanner will take pictures of your heart. During this time, you will be asked to lie still and hold your breath for 2-3 seconds while a picture of your heart is being taken. The procedure may vary among health care providers and hospitals. What happens after the procedure?  You can get dressed.  You can return to your normal activities.  It is up to you to get the results of your test. Ask your health care provider, or the department that is doing the test, when your results will be ready. Summary  A coronary calcium scan is an imaging test used to look for deposits of calcium and other  fatty materials (plaques) in the inner lining of the blood vessels of the heart (coronary arteries).  Generally, this is a safe procedure. Tell your health care provider if you are pregnant or may be pregnant.  No preparation is needed for this procedure.  A CT scanner will take pictures of your heart.  You can return to your normal activities after the scan is done. This information is not intended to replace advice given to you by your health care provider. Make sure you discuss any questions you have with your health care provider. Document Released: 05/11/2008 Document Revised: 10/02/2016 Document Reviewed: 10/02/2016 Elsevier Interactive Patient Education  2019 [employer].

## 2019-05-01 ENCOUNTER — Telehealth: Payer: Self-pay | Admitting: Internal Medicine

## 2019-05-01 NOTE — Telephone Encounter (Signed)
Left message to call and schedule ct

## 2019-05-02 NOTE — Telephone Encounter (Signed)
Patient is scheduled   

## 2019-05-05 ENCOUNTER — Ambulatory Visit (HOSPITAL_COMMUNITY): Payer: Medicare HMO

## 2019-05-07 ENCOUNTER — Telehealth: Payer: Self-pay | Admitting: Internal Medicine

## 2019-05-07 ENCOUNTER — Telehealth (HOSPITAL_COMMUNITY): Payer: Self-pay | Admitting: Emergency Medicine

## 2019-05-07 NOTE — Telephone Encounter (Signed)
Left message to call to reschedule Cardiac Ct from 8-16 to 6-18 due to machine maintenance @ [facility].

## 2019-05-07 NOTE — Telephone Encounter (Signed)
Entered in error

## 2019-05-13 ENCOUNTER — Ambulatory Visit: Admission: RE | Admit: 2019-05-13 | Payer: Medicare HMO | Source: Ambulatory Visit

## 2019-05-15 ENCOUNTER — Ambulatory Visit: Admission: RE | Admit: 2019-05-15 | Payer: Medicare HMO | Source: Ambulatory Visit

## 2019-05-16 ENCOUNTER — Other Ambulatory Visit
Admission: RE | Admit: 2019-05-16 | Discharge: 2019-05-16 | Disposition: A | Discharge status: Home / Self Care | Payer: Medicare HMO | Source: Ambulatory Visit | Attending: Internal Medicine | Admitting: Internal Medicine

## 2019-05-16 DIAGNOSIS — Z01810 Encounter for preprocedural cardiovascular examination: Secondary | ICD-10-CM

## 2019-05-16 DIAGNOSIS — R079 Chest pain, unspecified: Secondary | ICD-10-CM | POA: Diagnosis not present

## 2019-05-16 LAB — BASIC METABOLIC PANEL
Anion gap: 10 (ref 5–15)
BUN: 12 mg/dL (ref 6–20)
CO2: 27 mmol/L (ref 22–32)
Calcium: 9.3 mg/dL (ref 8.9–10.3)
Chloride: 98 mmol/L (ref 98–111)
Creatinine, Ser: 0.57 mg/dL (ref 0.44–1.00)
GFR calc Af Amer: >60 mL/min (ref >60)
GFR calc non Af Amer: >60 mL/min (ref >60)
Glucose, Bld: 352 mg/dL — ABNORMAL HIGH (ref 70–99)
Potassium: 3.9 mmol/L (ref 3.5–5.1)
Sodium: 135 mmol/L (ref 135–145)

## 2019-05-17 ENCOUNTER — Telehealth (HOSPITAL_COMMUNITY): Payer: Self-pay | Admitting: Emergency Medicine

## 2019-05-17 NOTE — Telephone Encounter (Signed)
Left message on voicemail with name and callback number Rajvi Armentor RN Navigator Cardiac Imaging [facility] [phone] Office [phone] Cell  

## 2019-05-19 ENCOUNTER — Other Ambulatory Visit: Payer: Self-pay

## 2019-05-19 ENCOUNTER — Ambulatory Visit
Admission: RE | Admit: 2019-05-19 | Discharge: 2019-05-19 | Disposition: A | Discharge status: Home / Self Care | Payer: Medicare HMO | Source: Ambulatory Visit | Attending: Internal Medicine | Admitting: Internal Medicine

## 2019-05-19 DIAGNOSIS — K449 Diaphragmatic hernia without obstruction or gangrene: Secondary | ICD-10-CM | POA: Insufficient documentation

## 2019-05-19 DIAGNOSIS — I2510 Atherosclerotic heart disease of native coronary artery without angina pectoris: Secondary | ICD-10-CM | POA: Diagnosis not present

## 2019-05-19 DIAGNOSIS — R079 Chest pain, unspecified: Secondary | ICD-10-CM | POA: Diagnosis not present

## 2019-05-19 MED ORDER — METOPROLOL TARTRATE 5 MG/5ML IV SOLN
5.0000 mg | INTRAVENOUS | Status: DC | PRN
  Administered 2019-05-19 (×3): 5 mg via INTRAVENOUS

## 2019-05-19 MED ORDER — IOHEXOL 350 MG/ML SOLN
100.0000 mL | Freq: Once | INTRAVENOUS | Status: AC | PRN
  Administered 2019-05-19: 100 mL via INTRAVENOUS

## 2019-05-19 MED ORDER — NITROGLYCERIN 0.4 MG SL SUBL
0.8000 mg | SUBLINGUAL_TABLET | SUBLINGUAL | Status: DC | PRN
  Administered 2019-05-19: 0.8 mg via SUBLINGUAL

## 2019-05-21 ENCOUNTER — Ambulatory Visit: Admission: RE | Admit: 2019-05-21 | Payer: Medicare HMO | Source: Ambulatory Visit

## 2019-05-21 ENCOUNTER — Telehealth: Payer: Self-pay | Admitting: Internal Medicine

## 2019-05-21 DIAGNOSIS — Z79899 Other long term (current) drug therapy: Secondary | ICD-10-CM

## 2019-05-21 DIAGNOSIS — R079 Chest pain, unspecified: Secondary | ICD-10-CM

## 2019-05-21 MED ORDER — ROSUVASTATIN CALCIUM 5 MG PO TABS: 5.0000 mg | ORAL_TABLET | Freq: Every day | ORAL | 3 refills | Status: DC

## 2019-05-21 NOTE — Telephone Encounter (Signed)
Notes recorded by Nelva Bush, MD on 05/20/2019 at 3:23 PM EDT  Please let Kristin Wilson know that her cardiac CTA shows mild to moderate coronary artery disease, which would not explain her chest pain. I suggest that we continue her current medications and consider adding low-dose statin, as she has evidence of coronary atherosclerosis despite having a relatively low LDL at baseline. If she is agreeable, I recommend adding rosuvastatin 5 mg daily with f/u lipid panel and ALT in ~3 months. A small hiatal hernia was noted, which can sometimes contribute to heartburn and chest pain. We should also have her f/u with me or an APP in ~3 months.

## 2019-05-21 NOTE — Telephone Encounter (Signed)
I spoke with the patient regarding her Cardiac CTA results.  She is aware of Dr. Darnelle Bos recommendations to: 1) Start crestor 5 mg once daily 2) have a follow up lipid/ ALT in 3 months 3) f/u in clinic in 3 months  The patient voices understanding of the above and is agreeable. She is aware the schedules are not out for 3 months. I have asked that she call back in mid-late July to schedule if she has not heard from our office.   She is agreeable.

## 2019-06-09 DIAGNOSIS — E0590 Thyrotoxicosis, unspecified without thyrotoxic crisis or storm: Secondary | ICD-10-CM | POA: Diagnosis not present

## 2019-06-09 DIAGNOSIS — I119 Hypertensive heart disease without heart failure: Secondary | ICD-10-CM | POA: Diagnosis not present

## 2019-06-09 DIAGNOSIS — M545 Low back pain: Secondary | ICD-10-CM | POA: Diagnosis not present

## 2019-06-13 DIAGNOSIS — I10 Essential (primary) hypertension: Secondary | ICD-10-CM | POA: Diagnosis not present

## 2019-06-13 DIAGNOSIS — R5381 Other malaise: Secondary | ICD-10-CM | POA: Diagnosis not present

## 2019-06-13 DIAGNOSIS — E034 Atrophy of thyroid (acquired): Secondary | ICD-10-CM | POA: Diagnosis not present

## 2019-06-13 DIAGNOSIS — I2510 Atherosclerotic heart disease of native coronary artery without angina pectoris: Secondary | ICD-10-CM | POA: Diagnosis not present

## 2019-06-17 DIAGNOSIS — E1165 Type 2 diabetes mellitus with hyperglycemia: Secondary | ICD-10-CM | POA: Diagnosis not present

## 2019-06-17 DIAGNOSIS — E0590 Thyrotoxicosis, unspecified without thyrotoxic crisis or storm: Secondary | ICD-10-CM | POA: Diagnosis not present

## 2019-06-17 DIAGNOSIS — I119 Hypertensive heart disease without heart failure: Secondary | ICD-10-CM | POA: Diagnosis not present

## 2019-06-17 DIAGNOSIS — M1610 Unilateral primary osteoarthritis, unspecified hip: Secondary | ICD-10-CM | POA: Diagnosis not present

## 2019-06-18 ENCOUNTER — Ambulatory Visit
Admission: RE | Admit: 2019-06-18 | Discharge: 2019-06-18 | Disposition: A | Discharge status: Home / Self Care | Payer: Medicare HMO | Source: Ambulatory Visit | Attending: Internal Medicine | Admitting: Internal Medicine

## 2019-06-18 ENCOUNTER — Other Ambulatory Visit: Payer: Self-pay | Admitting: Internal Medicine

## 2019-06-18 DIAGNOSIS — M1612 Unilateral primary osteoarthritis, left hip: Secondary | ICD-10-CM | POA: Diagnosis not present

## 2019-06-18 DIAGNOSIS — R52 Pain, unspecified: Secondary | ICD-10-CM

## 2019-06-18 DIAGNOSIS — M545 Low back pain: Secondary | ICD-10-CM | POA: Diagnosis not present

## 2019-06-25 DIAGNOSIS — M545 Low back pain: Secondary | ICD-10-CM | POA: Diagnosis not present

## 2019-06-26 DIAGNOSIS — Z7984 Long term (current) use of oral hypoglycemic drugs: Secondary | ICD-10-CM | POA: Diagnosis not present

## 2019-06-26 DIAGNOSIS — Z1159 Encounter for screening for other viral diseases: Secondary | ICD-10-CM | POA: Diagnosis not present

## 2019-06-26 DIAGNOSIS — Z79899 Other long term (current) drug therapy: Secondary | ICD-10-CM | POA: Diagnosis not present

## 2019-06-26 DIAGNOSIS — G8929 Other chronic pain: Secondary | ICD-10-CM | POA: Diagnosis not present

## 2019-06-26 DIAGNOSIS — E781 Pure hyperglyceridemia: Secondary | ICD-10-CM | POA: Diagnosis not present

## 2019-06-26 DIAGNOSIS — E119 Type 2 diabetes mellitus without complications: Secondary | ICD-10-CM | POA: Diagnosis not present

## 2019-06-26 DIAGNOSIS — E0590 Thyrotoxicosis, unspecified without thyrotoxic crisis or storm: Secondary | ICD-10-CM | POA: Diagnosis not present

## 2019-06-26 DIAGNOSIS — I10 Essential (primary) hypertension: Secondary | ICD-10-CM | POA: Diagnosis not present

## 2019-06-26 DIAGNOSIS — M5442 Lumbago with sciatica, left side: Secondary | ICD-10-CM | POA: Diagnosis not present

## 2019-06-27 ENCOUNTER — Telehealth: Payer: Self-pay

## 2019-06-27 NOTE — Telephone Encounter (Signed)
Spoke w/patient. Spotting has been over the past few weeks. More today, but still not a lot. Advised would need eval to see where bleeding is coming from. Can schedule apt. Pt request [facility]PH to review & advise prior to her scheduling apt.

## 2019-06-27 NOTE — Telephone Encounter (Signed)
Patient had a complete hysterectomy 09/24/2018. She has been spotting lately. She thinks it's time to find out what's going on. [phone]

## 2019-06-30 NOTE — Telephone Encounter (Signed)
Pt aware. She is out & about right now & will call to schedule apt when she returns home.

## 2019-07-10 ENCOUNTER — Other Ambulatory Visit: Payer: Self-pay | Admitting: Internal Medicine

## 2019-07-10 ENCOUNTER — Ambulatory Visit
Admission: RE | Admit: 2019-07-10 | Discharge: 2019-07-10 | Disposition: A | Discharge status: Home / Self Care | Payer: Medicare HMO | Source: Ambulatory Visit | Attending: Internal Medicine | Admitting: Internal Medicine

## 2019-07-10 DIAGNOSIS — M549 Dorsalgia, unspecified: Secondary | ICD-10-CM | POA: Insufficient documentation

## 2019-07-10 DIAGNOSIS — M545 Low back pain: Secondary | ICD-10-CM | POA: Diagnosis not present

## 2019-07-16 ENCOUNTER — Telehealth: Payer: Self-pay

## 2019-07-17 ENCOUNTER — Encounter: Payer: Self-pay | Admitting: Student in an Organized Health Care Education/Training Program

## 2019-07-17 ENCOUNTER — Ambulatory Visit
Payer: Medicare HMO | Attending: Student in an Organized Health Care Education/Training Program | Admitting: Student in an Organized Health Care Education/Training Program

## 2019-07-17 ENCOUNTER — Other Ambulatory Visit: Payer: Self-pay

## 2019-07-17 VITALS — BP 139/90 | HR 77 | Temp 98.8°F | Resp 18 | Ht 62.0 in | Wt 191.0 lb

## 2019-07-17 DIAGNOSIS — M5136 Other intervertebral disc degeneration, lumbar region: Secondary | ICD-10-CM

## 2019-07-17 DIAGNOSIS — G894 Chronic pain syndrome: Secondary | ICD-10-CM | POA: Insufficient documentation

## 2019-07-17 DIAGNOSIS — M51369 Other intervertebral disc degeneration, lumbar region without mention of lumbar back pain or lower extremity pain: Secondary | ICD-10-CM | POA: Insufficient documentation

## 2019-07-17 DIAGNOSIS — M47816 Spondylosis without myelopathy or radiculopathy, lumbar region: Secondary | ICD-10-CM

## 2019-07-17 MED ORDER — CELECOXIB 100 MG PO CAPS: 100.0000 mg | ORAL_CAPSULE | Freq: Two times a day (BID) | ORAL | 2 refills | Status: AC

## 2019-07-17 MED ORDER — GABAPENTIN 300 MG PO CAPS: ORAL_CAPSULE | ORAL | 2 refills | Status: DC

## 2019-07-17 NOTE — Progress Notes (Signed)
Safety precautions to be maintained throughout the outpatient stay will include: orient to surroundings, keep bed in low position, maintain call bell within reach at all times, provide assistance with transfer out of bed and ambulation.  

## 2019-07-17 NOTE — Progress Notes (Signed)
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  Stability: No instability observed          Stability: No instability observed          Skin & Extremity Inspection: Skin color, temperature, and hair growth are WNL. No peripheral edema or cyanosis. No masses, redness, swelling, asymmetry, or associated skin lesions. No contractures.  Skin & Extremity Inspection: Skin color, temperature, and hair growth are WNL. No peripheral edema or cyanosis. No masses, redness, swelling, asymmetry, or associated skin lesions. No contractures.  Functional ROM: Unrestricted ROM                  Functional ROM: Unrestricted ROM                  Muscle Tone/Strength: Functionally intact. No obvious neuro-muscular anomalies detected.  Muscle Tone/Strength: Functionally intact. No obvious neuro-muscular anomalies detected.  Sensory (Neurological):  Unimpaired        Sensory (Neurological): Unimpaired        DTR: Patellar: 2+: normal Achilles: deferred today Plantar: deferred today  DTR: Patellar: 2+: normal Achilles: deferred today Plantar: deferred today  Palpation: No palpable anomalies  Palpation: No palpable anomalies   Assessment  Primary Diagnosis & Pertinent Problem List: The primary encounter diagnosis was Lumbar facet arthropathy. Diagnoses of Lumbar spondylosis, Lumbar degenerative disc disease, and Chronic pain syndrome were also pertinent to this visit.  Visit Diagnosis (New problems to examiner): 1. Lumbar facet arthropathy   2. Lumbar spondylosis   3. Lumbar degenerative disc disease   4. Chronic pain syndrome    Plan of Care (Initial workup plan)  Note: Kristin Wilson was reminded that as per protocol, today's visit has been an evaluation only. We have not taken over the patient's controlled substance management.  General Recommendations: The pain condition that the patient suffers from is best treated with a multidisciplinary approach that involves an increase in physical activity to prevent de-conditioning and worsening of the pain cycle, as well as psychological counseling (formal and/or informal) to address the co-morbid psychological affects of pain. Treatment will often involve judicious use of pain medications and interventional procedures to decrease the pain, allowing the patient to participate in the physical activity that will ultimately produce long-lasting pain reductions. The goal of the multidisciplinary approach is to return the patient to a higher level of overall function and to restore their ability to perform activities of  daily living.  1. Lumbar facet arthropathy -Consider diagnostic lumbar facet medial branch nerve block  2. Lumbar spondylosis -Consider diagnostic lumbar facet medial branch nerve block  3. Lumbar degenerative disc disease -PT exercises, NSAID, gabapentin as below  4. Chronic  pain syndrome - celecoxib (CELEBREX) 100 MG capsule; Take 1 capsule (100 mg total) by mouth 2 (two) times daily.  Dispense: 60 capsule; Refill: 2 - gabapentin (NEURONTIN) 300 MG capsule; 300 mg qhs for 1 month. If no side effects, ok to increase to 600 qhs.  Dispense: 60 capsule; Refill: 2 - Compliance Drug Analysis, Ur - Ambulatory referral to Psychology    Lab Orders     Compliance Drug Analysis, Ur  Referral Orders     Ambulatory referral to Psychology  Pharmacotherapy (current): Medications ordered:  Meds ordered this encounter  Medications  . celecoxib (CELEBREX) 100 MG capsule    Sig: Take 1 capsule (100 mg total) by mouth 2 (two) times daily.    Dispense:  60 capsule    Refill:  2  . gabapentin (NEURONTIN) 300 MG capsule    Sig: 300 mg qhs for 1 month. If no side effects, ok to increase to 600 qhs.    Dispense:  60 capsule    Refill:  2   Medications administered during this visit: Lyndel A. Wilson had no medications administered during this visit.   Pharmacological management options:  Opioid Analgesics: The patient was informed that there is no guarantee that she would be a candidate for opioid analgesics. The decision will be made following CDC guidelines. This decision will be based on the results of diagnostic studies, as well as Kristin Wilson's risk profile.   Membrane stabilizer: Gabapentin as above  Muscle relaxant: To be determined at a later time  NSAID: Celebrex as above  Other analgesic(s): To be determined at a later time   Interventional management options: Kristin Wilson was informed that there is no guarantee that she would be a candidate for interventional therapies. The decision will be based on the results of diagnostic studies, as well as Kristin Wilson's risk profile.  Procedure(s) under consideration:  Diagnostic lumbar facet medial branch nerve blocks Bilateral sacroiliac joint injection   Provider-requested follow-up: Return for Patient will call  to sch 2nd appt after pain psych visit. [name]  No future appointments.  Primary Care Physician: Cletis Athens, MD Location: [facility] Note by: Gillis Santa, MD Date: 07/17/2019; Time: 3:29 PM  Note: This dictation was prepared with Dragon dictation. Any transcriptional errors that may result from this process are unintentional.

## 2019-07-20 LAB — COMPLIANCE DRUG ANALYSIS, UR

## 2019-08-01 ENCOUNTER — Telehealth: Payer: Self-pay | Admitting: Student in an Organized Health Care Education/Training Program

## 2019-08-01 NOTE — Telephone Encounter (Signed)
Patient called to ask about referral to Dr. Shea Evans. I called Dr. Andres Shad office and they have referral and will contact patient once approved. I called patient back and notified her of this. They will not be back in office until Wed. 08-06-19

## 2019-08-12 ENCOUNTER — Ambulatory Visit: Payer: Medicare HMO | Admitting: Obstetrics & Gynecology

## 2019-08-12 DIAGNOSIS — E0590 Thyrotoxicosis, unspecified without thyrotoxic crisis or storm: Secondary | ICD-10-CM | POA: Diagnosis not present

## 2019-08-12 DIAGNOSIS — I119 Hypertensive heart disease without heart failure: Secondary | ICD-10-CM | POA: Diagnosis not present

## 2019-08-13 ENCOUNTER — Ambulatory Visit: Payer: Medicare HMO | Admitting: Obstetrics & Gynecology

## 2019-08-18 ENCOUNTER — Ambulatory Visit: Payer: Medicare HMO | Admitting: Obstetrics & Gynecology

## 2019-09-01 ENCOUNTER — Ambulatory Visit: Payer: Medicare HMO | Admitting: Obstetrics & Gynecology

## 2019-09-01 DIAGNOSIS — E119 Type 2 diabetes mellitus without complications: Secondary | ICD-10-CM | POA: Diagnosis not present

## 2019-09-25 DIAGNOSIS — E785 Hyperlipidemia, unspecified: Secondary | ICD-10-CM | POA: Diagnosis not present

## 2019-09-25 DIAGNOSIS — E0580 Other thyrotoxicosis without thyrotoxic crisis or storm: Secondary | ICD-10-CM | POA: Diagnosis not present

## 2019-09-25 DIAGNOSIS — I10 Essential (primary) hypertension: Secondary | ICD-10-CM | POA: Diagnosis not present

## 2019-09-25 DIAGNOSIS — N951 Menopausal and female climacteric states: Secondary | ICD-10-CM | POA: Diagnosis not present

## 2019-09-25 DIAGNOSIS — E1165 Type 2 diabetes mellitus with hyperglycemia: Secondary | ICD-10-CM | POA: Diagnosis not present

## 2019-09-25 DIAGNOSIS — E6609 Other obesity due to excess calories: Secondary | ICD-10-CM | POA: Diagnosis not present

## 2019-09-29 DIAGNOSIS — E0580 Other thyrotoxicosis without thyrotoxic crisis or storm: Secondary | ICD-10-CM | POA: Diagnosis not present

## 2019-09-29 DIAGNOSIS — E6609 Other obesity due to excess calories: Secondary | ICD-10-CM | POA: Diagnosis not present

## 2019-09-29 DIAGNOSIS — E785 Hyperlipidemia, unspecified: Secondary | ICD-10-CM | POA: Diagnosis not present

## 2019-09-29 DIAGNOSIS — E1165 Type 2 diabetes mellitus with hyperglycemia: Secondary | ICD-10-CM | POA: Diagnosis not present

## 2019-09-29 DIAGNOSIS — N951 Menopausal and female climacteric states: Secondary | ICD-10-CM | POA: Diagnosis not present

## 2019-09-29 DIAGNOSIS — Z6836 Body mass index (BMI) 36.0-36.9, adult: Secondary | ICD-10-CM | POA: Diagnosis not present

## 2019-09-29 DIAGNOSIS — I10 Essential (primary) hypertension: Secondary | ICD-10-CM | POA: Diagnosis not present

## 2019-10-06 DIAGNOSIS — E042 Nontoxic multinodular goiter: Secondary | ICD-10-CM | POA: Diagnosis not present

## 2019-10-06 DIAGNOSIS — E1165 Type 2 diabetes mellitus with hyperglycemia: Secondary | ICD-10-CM | POA: Diagnosis not present

## 2019-10-06 DIAGNOSIS — E785 Hyperlipidemia, unspecified: Secondary | ICD-10-CM | POA: Diagnosis not present

## 2019-10-06 DIAGNOSIS — N951 Menopausal and female climacteric states: Secondary | ICD-10-CM | POA: Diagnosis not present

## 2019-10-06 DIAGNOSIS — E0580 Other thyrotoxicosis without thyrotoxic crisis or storm: Secondary | ICD-10-CM | POA: Diagnosis not present

## 2019-10-06 DIAGNOSIS — E6609 Other obesity due to excess calories: Secondary | ICD-10-CM | POA: Diagnosis not present

## 2019-10-06 DIAGNOSIS — I10 Essential (primary) hypertension: Secondary | ICD-10-CM | POA: Diagnosis not present

## 2019-11-03 ENCOUNTER — Other Ambulatory Visit: Payer: Self-pay | Admitting: Student in an Organized Health Care Education/Training Program

## 2019-11-03 DIAGNOSIS — G894 Chronic pain syndrome: Secondary | ICD-10-CM

## 2019-12-08 ENCOUNTER — Ambulatory Visit (INDEPENDENT_AMBULATORY_CARE_PROVIDER_SITE_OTHER): Payer: Medicare HMO | Admitting: Obstetrics & Gynecology

## 2019-12-08 ENCOUNTER — Other Ambulatory Visit: Payer: Self-pay

## 2019-12-08 ENCOUNTER — Encounter: Payer: Self-pay | Admitting: Obstetrics & Gynecology

## 2019-12-08 ENCOUNTER — Telehealth: Payer: Self-pay

## 2019-12-08 VITALS — BP 120/80 | Ht 62.0 in | Wt 201.0 lb

## 2019-12-08 DIAGNOSIS — Z1211 Encounter for screening for malignant neoplasm of colon: Secondary | ICD-10-CM

## 2019-12-08 DIAGNOSIS — Z01419 Encounter for gynecological examination (general) (routine) without abnormal findings: Secondary | ICD-10-CM

## 2019-12-08 DIAGNOSIS — R232 Flushing: Secondary | ICD-10-CM

## 2019-12-08 DIAGNOSIS — Z1231 Encounter for screening mammogram for malignant neoplasm of breast: Secondary | ICD-10-CM

## 2019-12-08 MED ORDER — ESTRADIOL 0.075 MG/24HR TD PTTW: 1.0000 | MEDICATED_PATCH | TRANSDERMAL | 4 refills | Status: DC

## 2019-12-08 NOTE — Progress Notes (Signed)
HPI:      Kristin Wilson is a 59 y.o. [phone] who LMP was in the past ([place] last year), she presents today for her annual examination.  The patient has no complaints today. The patient is sexually active. Herlast pap: approximate date 2019 and was normal and last mammogram: approximate date 2019 and was normal.  The patient does perform self breast exams.  There is no notable family history of breast or ovarian cancer in her family. The patient is taking hormone replacement therapy. Patient denies post-menopausal vaginal bleeding.   The patient has regular exercise: yes. The patient denies current symptoms of depression.    GYN Hx: Last Colonoscopy:never ago.   PMHx: Past Medical History:  Diagnosis Date  . Bronchitis   . Diabetes mellitus without complication ([place])   . Hypertension   . Hyperthyroidism   . Osteoarthritis   . Pyelonephritis   . Sciatica   . Urinary tract infection    Past Surgical History:  Procedure Laterality Date  . ANTERIOR AND POSTERIOR REPAIR WITH SACROSPINOUS FIXATION N/A 09/24/2018   Procedure: POSTERIOR REPAIR;  Surgeon: Gae Dry, MD;  Location: [facility] ORS;  Service: Gynecology;  Laterality: N/A;  . cholecystecomy    . CHOLECYSTECTOMY    . CYSTOSCOPY N/A 09/24/2018   Procedure: CYSTOSCOPY;  Surgeon: Gae Dry, MD;  Location: [facility] ORS;  Service: Gynecology;  Laterality: N/A;  . LAPAROSCOPIC HYSTERECTOMY Bilateral 09/24/2018   Procedure: HYSTERECTOMY TOTAL LAPAROSCOPIC- BSO;  Surgeon: Gae Dry, MD;  Location: [facility] ORS;  Service: Gynecology;  Laterality: Bilateral;  . TUBAL LIGATION     Family History  Problem Relation Age of Onset  . Coronary artery disease Mother 21  . Diabetic kidney disease Paternal Grandmother   . CAD Paternal Grandmother 31   Social History   Tobacco Use  . Smoking status: Never Smoker  . Smokeless tobacco: Never Used  Substance Use Topics  . Alcohol use: No  . Drug use: No    Current  Outpatient Medications:  .  aspirin EC 81 MG tablet, Take 81 mg by mouth daily., Disp: , Rfl:  .  gabapentin (NEURONTIN) 300 MG capsule, 300 mg qhs for 1 month. If no side effects, ok to increase to 600 qhs., Disp: 60 capsule, Rfl: 2 .  glipiZIDE (GLUCOTROL XL) 10 MG 24 hr tablet, Take 10 mg by mouth daily., Disp: , Rfl:  .  methimazole (TAPAZOLE) 10 MG tablet, Take 1 tablet (10 mg total) by mouth 2 (two) times daily. (Patient taking differently: Take 20 mg by mouth every morning. ), Disp: 60 tablet, Rfl: 0 .  metoprolol tartrate (LOPRESSOR) 25 MG tablet, Take 1 tablet (25 mg total) by mouth 2 (two) times daily., Disp: 60 tablet, Rfl: 0 .  metFORMIN (GLUCOPHAGE) 500 MG tablet, Take 500-1,000 mg by mouth 2 (two) times daily. , Disp: , Rfl:  .  rosuvastatin (CRESTOR) 5 MG tablet, Take 1 tablet (5 mg total) by mouth daily., Disp: 90 tablet, Rfl: 3 Allergies: Patient has no known allergies.  Review of Systems  Constitutional: Negative for chills, fever and malaise/fatigue.  HENT: Negative for congestion, sinus pain and sore throat.   Eyes: Negative for blurred vision and pain.  Respiratory: Negative for cough and wheezing.   Cardiovascular: Negative for chest pain and leg swelling.  Gastrointestinal: Negative for abdominal pain, constipation, diarrhea, heartburn, nausea and vomiting.  Genitourinary: Negative for dysuria, frequency, hematuria and urgency.  Musculoskeletal: Negative for back pain, joint  pain, myalgias and neck pain.  Skin: Negative for itching and rash.  Neurological: Negative for dizziness, tremors and weakness.  Endo/Heme/Allergies: Does not bruise/bleed easily.  Psychiatric/Behavioral: Negative for depression. The patient is not nervous/anxious and does not have insomnia.     Objective: BP 120/80   Ht 5\' 2"  (1.575 m)   Wt 201 lb (91.2 kg)   LMP 09/22/2017   BMI 36.76 kg/m   Filed Weights   12/08/19 1336  Weight: 201 lb (91.2 kg)   Body mass index is 36.76 kg/m.  Physical Exam Constitutional:      General: She is not in acute distress.    Appearance: She is well-developed.  Genitourinary:     Pelvic exam was performed with patient supine.     Vagina and rectum normal.     No lesions in the vagina.     No vaginal bleeding.     No right or left adnexal mass present.     Right adnexa not tender.     Left adnexa not tender.     Genitourinary Comments: Absent Uterus Absent cervix Vaginal cuff well healed  HENT:     Head: Normocephalic and atraumatic. No laceration.     Right Ear: Hearing normal.     Left Ear: Hearing normal.     Mouth/Throat:     Pharynx: Uvula midline.  Eyes:     Pupils: Pupils are equal, round, and reactive to light.  Neck:     Thyroid: No thyromegaly.  Cardiovascular:     Rate and Rhythm: Normal rate and regular rhythm.     Heart sounds: No murmur. No friction rub. No gallop.   Pulmonary:     Effort: Pulmonary effort is normal. No respiratory distress.     Breath sounds: Normal breath sounds. No wheezing.  Chest:     Breasts:        Right: No mass, skin change or tenderness.        Left: No mass, skin change or tenderness.  Abdominal:     General: Bowel sounds are normal. There is no distension.     Palpations: Abdomen is soft.     Tenderness: There is no abdominal tenderness. There is no rebound.  Musculoskeletal:        General: Normal range of motion.     Cervical back: Normal range of motion and neck supple.  Neurological:     Mental Status: She is alert and oriented to person, place, and time.     Cranial Nerves: No cranial nerve deficit.  Skin:    General: Skin is warm and dry.  Psychiatric:        Judgment: Judgment normal.  Vitals reviewed.     Assessment: Annual Exam 1. Women's annual routine gynecological examination   2. Encounter for screening mammogram for malignant neoplasm of breast   3. Screen for colon cancer     Plan:            1.  Vaginal Screening-  Pap smear schedule reviewed  with patient, due 2024  2. Breast screening- Exam annually and mammogram scheduled  3. Colonoscopy every 10 years, Hemoccult testing after age 34  4. Labs managed by PCP  5. Counseling for hormonal therapy: no change in therapy today Cont ERT patch 0.075 mg 2 x weekly  6. Flu shot UTD  7. Obesity, counseled as to diet and exercise     F/U  Return in about 1 year (around 12/07/2020) for Annual.  Barnett Applebaum, MD, [facility], [facility] 12/08/2019  1:59 PM

## 2019-12-08 NOTE — Telephone Encounter (Signed)
Gastroenterology Pre-Procedure Review  Request Date: 12/19/19 Requesting Physician: Dr. Marius Ditch  PATIENT REVIEW QUESTIONS: The patient responded to the following health history questions as indicated:    1. Are you having any GI issues? no 2. Do you have a personal history of Polyps? no 3. Do you have a family history of Colon Cancer or Polyps? yes (aunt had colon cancer) 4. Diabetes Mellitus? yes (type 2) 5. Joint replacements in the past 12 months?no 6. Major health problems in the past 3 months?no 7. Any artificial heart valves, MVP, or defibrillator?no    MEDICATIONS & ALLERGIES:    Patient reports the following regarding taking any anticoagulation/antiplatelet therapy:   Plavix, Coumadin, Eliquis, Xarelto, Lovenox, Pradaxa, Brilinta, or Effient? no Aspirin? no  Patient confirms/reports the following medications:  Current Outpatient Medications  Medication Sig Dispense Refill  . aspirin EC 81 MG tablet Take 81 mg by mouth daily.    [name] estradiol (VIVELLE-DOT) 0.075 MG/24HR Place 1 patch onto the skin 2 (two) times a week. 24 patch 4  . gabapentin (NEURONTIN) 300 MG capsule 300 mg qhs for 1 month. If no side effects, ok to increase to 600 qhs. 60 capsule 2  . glipiZIDE (GLUCOTROL XL) 10 MG 24 hr tablet Take 10 mg by mouth daily.    . metFORMIN (GLUCOPHAGE) 500 MG tablet Take 500-1,000 mg by mouth 2 (two) times daily.     . methimazole (TAPAZOLE) 10 MG tablet Take 1 tablet (10 mg total) by mouth 2 (two) times daily. (Patient taking differently: Take 20 mg by mouth every morning. ) 60 tablet 0  . metoprolol tartrate (LOPRESSOR) 25 MG tablet Take 1 tablet (25 mg total) by mouth 2 (two) times daily. 60 tablet 0  . rosuvastatin (CRESTOR) 5 MG tablet Take 1 tablet (5 mg total) by mouth daily. 90 tablet 3   No current facility-administered medications for this visit.    Patient confirms/reports the following allergies:  No Known Allergies  No orders of the defined types were placed in  this encounter.   AUTHORIZATION INFORMATION Primary Insurance: 1D#: Group #:  Secondary Insurance: 1D#: Group #:  SCHEDULE INFORMATION: Date: 12/19/19 Time: Location:[facility]

## 2019-12-08 NOTE — Patient Instructions (Signed)
PAP every 5 years Mammogram every year    Call [phone] to schedule at [facility] Colonoscopy every 10 years Labs yearly (with PCP)

## 2019-12-11 ENCOUNTER — Other Ambulatory Visit: Payer: Self-pay | Admitting: Internal Medicine

## 2019-12-17 ENCOUNTER — Other Ambulatory Visit: Payer: Self-pay

## 2019-12-17 ENCOUNTER — Other Ambulatory Visit
Admission: RE | Admit: 2019-12-17 | Discharge: 2019-12-17 | Disposition: A | Discharge status: Home / Self Care | Payer: Medicare HMO | Source: Ambulatory Visit | Attending: Gastroenterology | Admitting: Gastroenterology

## 2019-12-17 DIAGNOSIS — Z01812 Encounter for preprocedural laboratory examination: Secondary | ICD-10-CM | POA: Diagnosis not present

## 2019-12-17 DIAGNOSIS — Z20822 Contact with and (suspected) exposure to covid-19: Secondary | ICD-10-CM | POA: Insufficient documentation

## 2019-12-17 LAB — SARS CORONAVIRUS 2 (TAT 6-24 HRS): SARS Coronavirus 2: NEGATIVE

## 2019-12-19 ENCOUNTER — Ambulatory Visit: Payer: Medicare HMO | Admitting: Anesthesiology

## 2019-12-19 ENCOUNTER — Other Ambulatory Visit: Payer: Self-pay

## 2019-12-19 ENCOUNTER — Encounter: Payer: Self-pay | Admitting: Gastroenterology

## 2019-12-19 ENCOUNTER — Ambulatory Visit
Admission: RE | Admit: 2019-12-19 | Discharge: 2019-12-19 | Disposition: A | Discharge status: Home / Self Care | Payer: Medicare HMO | Attending: Gastroenterology | Admitting: Gastroenterology

## 2019-12-19 ENCOUNTER — Encounter
Admission: RE | Disposition: A | Discharge status: Home / Self Care | Payer: Self-pay | Source: Home / Self Care | Attending: Gastroenterology

## 2019-12-19 DIAGNOSIS — Z9049 Acquired absence of other specified parts of digestive tract: Secondary | ICD-10-CM | POA: Insufficient documentation

## 2019-12-19 DIAGNOSIS — I10 Essential (primary) hypertension: Secondary | ICD-10-CM | POA: Diagnosis not present

## 2019-12-19 DIAGNOSIS — Z1211 Encounter for screening for malignant neoplasm of colon: Secondary | ICD-10-CM | POA: Insufficient documentation

## 2019-12-19 DIAGNOSIS — E119 Type 2 diabetes mellitus without complications: Secondary | ICD-10-CM | POA: Insufficient documentation

## 2019-12-19 DIAGNOSIS — K5730 Diverticulosis of large intestine without perforation or abscess without bleeding: Secondary | ICD-10-CM | POA: Insufficient documentation

## 2019-12-19 DIAGNOSIS — M1990 Unspecified osteoarthritis, unspecified site: Secondary | ICD-10-CM | POA: Diagnosis not present

## 2019-12-19 DIAGNOSIS — E0590 Thyrotoxicosis, unspecified without thyrotoxic crisis or storm: Secondary | ICD-10-CM | POA: Diagnosis not present

## 2019-12-19 DIAGNOSIS — Z79899 Other long term (current) drug therapy: Secondary | ICD-10-CM | POA: Insufficient documentation

## 2019-12-19 DIAGNOSIS — E669 Obesity, unspecified: Secondary | ICD-10-CM | POA: Diagnosis not present

## 2019-12-19 DIAGNOSIS — Z6836 Body mass index (BMI) 36.0-36.9, adult: Secondary | ICD-10-CM | POA: Diagnosis not present

## 2019-12-19 DIAGNOSIS — Z7984 Long term (current) use of oral hypoglycemic drugs: Secondary | ICD-10-CM | POA: Diagnosis not present

## 2019-12-19 DIAGNOSIS — M5430 Sciatica, unspecified side: Secondary | ICD-10-CM | POA: Diagnosis not present

## 2019-12-19 DIAGNOSIS — Z7982 Long term (current) use of aspirin: Secondary | ICD-10-CM | POA: Insufficient documentation

## 2019-12-19 DIAGNOSIS — Z90710 Acquired absence of both cervix and uterus: Secondary | ICD-10-CM | POA: Diagnosis not present

## 2019-12-19 DIAGNOSIS — K5790 Diverticulosis of intestine, part unspecified, without perforation or abscess without bleeding: Secondary | ICD-10-CM | POA: Diagnosis not present

## 2019-12-19 HISTORY — PX: COLONOSCOPY WITH PROPOFOL: SHX5780

## 2019-12-19 LAB — GLUCOSE, CAPILLARY: Glucose-Capillary: 238 mg/dL — ABNORMAL HIGH (ref 70–99)

## 2019-12-19 SURGERY — COLONOSCOPY WITH PROPOFOL
Anesthesia: General

## 2019-12-19 MED ORDER — PROPOFOL 10 MG/ML IV BOLUS
INTRAVENOUS | Status: DC | PRN
  Administered 2019-12-19: 20 ug via INTRAVENOUS
  Administered 2019-12-19: 30 ug via INTRAVENOUS

## 2019-12-19 MED ORDER — FENTANYL CITRATE (PF) 100 MCG/2ML IJ SOLN
INTRAMUSCULAR | Status: AC
  Filled 2019-12-19: qty 2

## 2019-12-19 MED ORDER — PROPOFOL 500 MG/50ML IV EMUL
INTRAVENOUS | Status: DC | PRN
  Administered 2019-12-19: 75 ug/kg/min via INTRAVENOUS

## 2019-12-19 MED ORDER — SODIUM CHLORIDE 0.9 % IV SOLN: INTRAVENOUS | Status: DC

## 2019-12-19 MED ORDER — MIDAZOLAM HCL 2 MG/2ML IJ SOLN
INTRAMUSCULAR | Status: AC
  Filled 2019-12-19: qty 2

## 2019-12-19 MED ORDER — LIDOCAINE HCL (CARDIAC) PF 100 MG/5ML IV SOSY
PREFILLED_SYRINGE | INTRAVENOUS | Status: DC | PRN
  Administered 2019-12-19: 60 mg via INTRAVENOUS

## 2019-12-19 MED ORDER — FENTANYL CITRATE (PF) 100 MCG/2ML IJ SOLN
INTRAMUSCULAR | Status: DC | PRN
  Administered 2019-12-19 (×2): 50 ug via INTRAVENOUS

## 2019-12-19 MED ORDER — MIDAZOLAM HCL 2 MG/2ML IJ SOLN
INTRAMUSCULAR | Status: DC | PRN
  Administered 2019-12-19: 2 mg via INTRAVENOUS

## 2019-12-19 NOTE — Anesthesia Preprocedure Evaluation (Signed)
Anesthesia Evaluation  Patient identified by MRN, date of birth, ID band Patient awake    Reviewed: Allergy & Precautions, NPO status , Patient's Chart, lab work & pertinent test results  History of Anesthesia Complications Negative for: history of anesthetic complications  Airway Mallampati: III  TM Distance: >3 FB Neck ROM: Full    Dental no notable dental hx.    Pulmonary neg pulmonary ROS, neg sleep apnea, neg COPD,    breath sounds clear to auscultation- rhonchi (-) wheezing      Cardiovascular hypertension, Pt. on medications (-) CAD, (-) Past MI, (-) Cardiac Stents and (-) CABG  Rhythm:Regular Rate:Normal - Systolic murmurs and - Diastolic murmurs    Neuro/Psych neg Seizures negative neurological ROS  negative psych ROS   GI/Hepatic negative GI ROS, Neg liver ROS,   Endo/Other  diabetes, Oral Hypoglycemic Agents  Renal/GU negative Renal ROS     Musculoskeletal  (+) Arthritis ,   Abdominal (+) + obese,   Peds  Hematology negative hematology ROS (+)   Anesthesia Other Findings Past Medical History: No date: Bronchitis No date: Diabetes mellitus without complication (HCC) No date: Hypertension No date: Hyperthyroidism No date: Osteoarthritis No date: Pyelonephritis No date: Sciatica No date: Urinary tract infection   Reproductive/Obstetrics                             Anesthesia Physical Anesthesia Plan  ASA: II  Anesthesia Plan: General   Post-op Pain Management:    Induction: Intravenous  PONV Risk Score and Plan: 2 and Propofol infusion  Airway Management Planned: Natural Airway  Additional Equipment:   Intra-op Plan:   Post-operative Plan:   Informed Consent: I have reviewed the patients History and Physical, chart, labs and discussed the procedure including the risks, benefits and alternatives for the proposed anesthesia with the patient or authorized  representative who has indicated his/her understanding and acceptance.     Dental advisory given  Plan Discussed with: CRNA and Anesthesiologist  Anesthesia Plan Comments:         Anesthesia Quick Evaluation

## 2019-12-19 NOTE — Transfer of Care (Signed)
Immediate Anesthesia Transfer of Care Note  Patient: Kristin Wilson  Procedure(s) Performed: COLONOSCOPY WITH PROPOFOL (N/A )  Patient Location: PACU  Anesthesia Type:General  Level of Consciousness: sedated  Airway & Oxygen Therapy: Patient Spontanous Breathing and Patient connected to nasal cannula oxygen  Post-op Assessment: Report given to RN and Post -op Vital signs reviewed and stable  Post vital signs: Reviewed and stable  Last Vitals:  Vitals Value Taken Time  BP 122/71 12/19/19 1346  Temp    Pulse 61 12/19/19 1347  Resp 18 12/19/19 1347  SpO2 96 % 12/19/19 1347  Vitals shown include unvalidated device data.  Last Pain:  Vitals:   12/19/19 1346  TempSrc: (P) Temporal  PainSc:          Complications: No apparent anesthesia complications

## 2019-12-19 NOTE — Anesthesia Postprocedure Evaluation (Signed)
Anesthesia Post Note  Patient: Debara A Lisby  Procedure(s) Performed: COLONOSCOPY WITH PROPOFOL (N/A )  Patient location during evaluation: Endoscopy Anesthesia Type: General Level of consciousness: awake and alert and oriented Pain management: pain level controlled Vital Signs Assessment: post-procedure vital signs reviewed and stable Respiratory status: spontaneous breathing, nonlabored ventilation and respiratory function stable Cardiovascular status: blood pressure returned to baseline and stable Postop Assessment: no signs of nausea or vomiting Anesthetic complications: no     Last Vitals:  Vitals:   12/19/19 1356 12/19/19 1406  BP: 119/74 128/77  Pulse:    Resp:    Temp:    SpO2:      Last Pain:  Vitals:   12/19/19 1406  TempSrc:   PainSc: 0-No pain                 Reyann Troop

## 2019-12-19 NOTE — Op Note (Addendum)
[facility] Gastroenterology Patient Name: Kristin Wilson Procedure Date: 12/19/2019 1:18 PM MRN: [id number] Account #: [id number] Date of Birth: 1961/11/25 Admit Type: Outpatient Age: 59 Room: [facility] ENDO ROOM 2 Gender: Female Note Status: Finalized Procedure:             Colonoscopy Indications:           Screening for colorectal malignant neoplasm, This is                         the patient's first colonoscopy Providers:             Lin Landsman MD, MD Referring MD:          Cletis Athens, MD (Referring MD) Medicines:             Monitored Anesthesia Care Complications:         No immediate complications. Estimated blood loss: None. Procedure:             Pre-Anesthesia Assessment:                        - Prior to the procedure, a History and Physical was                         performed, and patient medications and allergies were                         reviewed. The patient is competent. The risks and                         benefits of the procedure and the sedation options and                         risks were discussed with the patient. All questions                         were answered and informed consent was obtained.                         Patient identification and proposed procedure were                         verified by the physician, the nurse, the                         anesthesiologist, the anesthetist and the technician                         in the pre-procedure area in the procedure room in the                         endoscopy suite. Mental Status Examination: alert and                         oriented. Airway Examination: normal oropharyngeal                         airway and neck mobility. Respiratory Examination:  clear to auscultation. CV Examination: normal.                         Prophylactic Antibiotics: The patient does not require                         prophylactic antibiotics. Prior  Anticoagulants: The                         patient has taken no previous anticoagulant or                         antiplatelet agents. ASA Grade Assessment: II - A                         patient with mild systemic disease. After reviewing                         the risks and benefits, the patient was deemed in                         satisfactory condition to undergo the procedure. The                         anesthesia plan was to use monitored anesthesia care                         (MAC). Immediately prior to administration of                         medications, the patient was re-assessed for adequacy                         to receive sedatives. The heart rate, respiratory                         rate, oxygen saturations, blood pressure, adequacy of                         pulmonary ventilation, and response to care were                         monitored throughout the procedure. The physical                         status of the patient was re-assessed after the                         procedure.                        After obtaining informed consent, the colonoscope was                         passed under direct vision. Throughout the procedure,                         the patient's blood pressure, pulse, and oxygen  saturations were monitored continuously. The                         Colonoscope was introduced through the anus and                         advanced to the the cecum, identified by appendiceal                         orifice and ileocecal valve. The colonoscopy was                         performed without difficulty. The patient tolerated                         the procedure well. The quality of the bowel                         preparation was evaluated using the BBPS [facility] Bowel                         Preparation Scale) with scores of: Right Colon = 3,                         Transverse Colon = 3 and Left Colon = 3 (entire mucosa                          seen well with no residual staining, small fragments                         of stool or opaque liquid). The total BBPS score                         equals 9. Findings:      The perianal and digital rectal examinations were normal. Pertinent       negatives include normal sphincter tone and no palpable rectal lesions.      Multiple diverticula were found in the entire colon.      The retroflexed view of the distal rectum and anal verge was normal and       showed no anal or rectal abnormalities.      The exam was otherwise without abnormality. Impression:            - Diverticulosis in the entire examined colon.                        - The distal rectum and anal verge are normal on                         retroflexion view.                        - The examination was otherwise normal.                        - No specimens collected. Recommendation:        - Discharge patient to home (with escort).                        -  Resume previous diet today.                        - Continue present medications.                        - Repeat colonoscopy in 10 years for surveillance. Procedure Code(s):     --- Professional ---                        Y0459, Colorectal cancer screening; colonoscopy on                         individual not meeting criteria for high risk Diagnosis Code(s):     --- Professional ---                        Z12.11, Encounter for screening for malignant neoplasm                         of colon                        K57.30, Diverticulosis of large intestine without                         perforation or abscess without bleeding CPT copyright 2019 [employer]. All rights reserved. The codes documented in this report are preliminary and upon coder review may  be revised to meet current compliance requirements. Dr. Ulyess Mort Lin Landsman MD, MD 12/19/2019 1:46:20 PM This report has been signed electronically. Number of  Addenda: 0 Note Initiated On: 12/19/2019 1:18 PM Scope Withdrawal Time: 0 hours 10 minutes 56 seconds  Total Procedure Duration: 0 hours 15 minutes 56 seconds  Estimated Blood Loss:  Estimated blood loss: none.      [facility]

## 2019-12-19 NOTE — H&P (Signed)
Cephas Darby, MD [address]  [place]  [place], [place] [address]  Main: [phone]  Fax: [phone] Pager: [phone]  Primary Care Physician:  Cletis Athens, MD Primary Gastroenterologist:  Dr. Cephas Darby  Pre-Procedure History & Physical: HPI:  Kristin Wilson is a 59 y.o. female is here for an colonoscopy.   Past Medical History:  Diagnosis Date  . Bronchitis   . Diabetes mellitus without complication ([place])   . Hypertension   . Hyperthyroidism   . Osteoarthritis   . Pyelonephritis   . Sciatica   . Urinary tract infection     Past Surgical History:  Procedure Laterality Date  . ANTERIOR AND POSTERIOR REPAIR WITH SACROSPINOUS FIXATION N/A 09/24/2018   Procedure: POSTERIOR REPAIR;  Surgeon: Gae Dry, MD;  Location: [facility] ORS;  Service: Gynecology;  Laterality: N/A;  . cholecystecomy    . CHOLECYSTECTOMY    . CYSTOSCOPY N/A 09/24/2018   Procedure: CYSTOSCOPY;  Surgeon: Gae Dry, MD;  Location: [facility] ORS;  Service: Gynecology;  Laterality: N/A;  . LAPAROSCOPIC HYSTERECTOMY Bilateral 09/24/2018   Procedure: HYSTERECTOMY TOTAL LAPAROSCOPIC- BSO;  Surgeon: Gae Dry, MD;  Location: [facility] ORS;  Service: Gynecology;  Laterality: Bilateral;  . TUBAL LIGATION      Prior to Admission medications   Medication Sig Start Date End Date Taking? Authorizing Provider  aspirin EC 81 MG tablet Take 81 mg by mouth daily.   Yes [provider]  estradiol (VIVELLE-DOT) 0.075 MG/24HR Place 1 patch onto the skin 2 (two) times a week. 12/08/19  Yes Gae Dry, MD  glipiZIDE (GLUCOTROL XL) 10 MG 24 hr tablet Take 10 mg by mouth daily. 06/17/18  Yes [provider]  metFORMIN (GLUCOPHAGE) 500 MG tablet Take 500-1,000 mg by mouth 2 (two) times daily.  10/27/16 12/19/19 Yes [provider]  methimazole (TAPAZOLE) 10 MG tablet Take 1 tablet (10 mg total) by mouth 2 (two) times daily. 10/08/15  Yes [name], [name], MD    metoprolol tartrate (LOPRESSOR) 25 MG tablet Take 1 tablet (25 mg total) by mouth 2 (two) times daily. 10/08/15  Yes [name], [name], MD  rosuvastatin (CRESTOR) 5 MG tablet Take 1 tablet (5 mg total) by mouth daily. Please call to schedule appointment for further refills. Thank you! 12/11/19  Yes End, Harrell Gave, MD  gabapentin (NEURONTIN) 300 MG capsule 300 mg qhs for 1 month. If no side effects, ok to increase to 600 qhs. Patient not taking: Reported on 12/19/2019 07/17/19   Gillis Santa, MD    Allergies as of 12/09/2019  . (No Known Allergies)    Family History  Problem Relation Age of Onset  . Coronary artery disease Mother 60  . Diabetic kidney disease Paternal Grandmother   . CAD Paternal Grandmother 36    Social History   Socioeconomic History  . Marital status: Divorced    Spouse name: Not on file  . Number of children: Not on file  . Years of education: Not on file  . Highest education level: Not on file  Occupational History  . Occupation: home maker  Tobacco Use  . Smoking status: Never Smoker  . Smokeless tobacco: Never Used  Substance and Sexual Activity  . Alcohol use: No  . Drug use: No  . Sexual activity: Never    Birth control/protection: None  Other Topics Concern  . Not on file  Social History Narrative  . Not on file   Social Determinants of Health  Financial Resource Strain:   . Difficulty of Paying Living Expenses: Not on file  Food Insecurity:   . Worried About Charity fundraiser in the Last Year: Not on file  . Ran Out of Food in the Last Year: Not on file  Transportation Needs:   . Lack of Transportation (Medical): Not on file  . Lack of Transportation (Non-Medical): Not on file  Physical Activity:   . Days of Exercise per Week: Not on file  . Minutes of Exercise per Session: Not on file  Stress:   . Feeling of Stress : Not on file  Social Connections:   . Frequency of Communication with Friends and Family: Not on file  .  Frequency of Social Gatherings with Friends and Family: Not on file  . Attends Religious Services: Not on file  . Active Member of Clubs or Organizations: Not on file  . Attends Archivist Meetings: Not on file  . Marital Status: Not on file  Intimate Partner Violence:   . Fear of Current or Ex-Partner: Not on file  . Emotionally Abused: Not on file  . Physically Abused: Not on file  . Sexually Abused: Not on file    Review of Systems: See HPI, otherwise negative ROS  Physical Exam: BP (!) 136/96   Pulse 72   Temp (!) 96.5 F (35.8 C) (Temporal)   Resp 16   Ht 5\' 2"  (1.575 m)   Wt 89.8 kg   LMP 09/22/2017   SpO2 100%   BMI 36.21 kg/m  General:   Alert,  pleasant and cooperative in NAD Head:  Normocephalic and atraumatic. Neck:  Supple; no masses or thyromegaly. Lungs:  Clear throughout to auscultation.    Heart:  Regular rate and rhythm. Abdomen:  Soft, nontender and nondistended. Normal bowel sounds, without guarding, and without rebound.   Neurologic:  Alert and  oriented x4;  grossly normal neurologically.  Impression/Plan: Kristin Wilson is here for an colonoscopy to be performed for colon cancer screening  Risks, benefits, limitations, and alternatives regarding  colonoscopy have been reviewed with the patient.  Questions have been answered.  All parties agreeable.   Sherri Sear, MD  12/19/2019, 12:24 PM

## 2019-12-22 ENCOUNTER — Encounter: Payer: Self-pay | Admitting: *Deleted

## 2020-01-08 ENCOUNTER — Other Ambulatory Visit: Payer: Self-pay | Admitting: Internal Medicine

## 2020-01-16 ENCOUNTER — Other Ambulatory Visit: Payer: Self-pay | Admitting: Internal Medicine

## 2020-01-22 DIAGNOSIS — H16223 Keratoconjunctivitis sicca, not specified as Sjogren's, bilateral: Secondary | ICD-10-CM | POA: Diagnosis not present

## 2020-02-16 ENCOUNTER — Other Ambulatory Visit: Payer: Self-pay | Admitting: Internal Medicine

## 2020-02-16 NOTE — Telephone Encounter (Signed)
Please schedule overdue F/U appointment with Dr. End. Thank you! ?

## 2020-02-27 ENCOUNTER — Ambulatory Visit: Payer: Medicare HMO | Admitting: Physician Assistant

## 2020-02-27 NOTE — Progress Notes (Deleted)
Office Visit    Patient Name: Kristin Wilson Date of Encounter: 02/27/2020  Primary Care Provider:  Cletis Athens, MD Primary Cardiologist:  No primary care provider on file.  Chief Complaint    No chief complaint on file.    Past Medical History    Past Medical History:  Diagnosis Date  . Bronchitis   . Diabetes mellitus without complication ([place])   . Hypertension   . Hyperthyroidism   . Osteoarthritis   . Pyelonephritis   . Sciatica   . Urinary tract infection    Past Surgical History:  Procedure Laterality Date  . ANTERIOR AND POSTERIOR REPAIR WITH SACROSPINOUS FIXATION N/A 09/24/2018   Procedure: POSTERIOR REPAIR;  Surgeon: Gae Dry, MD;  Location: [facility];  Service: Gynecology;  Laterality: N/A;  . cholecystecomy    . CHOLECYSTECTOMY    . COLONOSCOPY WITH PROPOFOL N/A 12/19/2019   Procedure: COLONOSCOPY WITH PROPOFOL;  Surgeon: Lin Landsman, MD;  [facility];  Service: Gastroenterology;  Laterality: N/A;  . CYSTOSCOPY N/A 09/24/2018   Procedure: CYSTOSCOPY;  Surgeon: Gae Dry, MD;  Location: [facility];  Service: Gynecology;  Laterality: N/A;  . LAPAROSCOPIC HYSTERECTOMY Bilateral 09/24/2018   Procedure: HYSTERECTOMY TOTAL LAPAROSCOPIC- BSO;  Surgeon: Gae Dry, MD;  Location: [facility];  Service: Gynecology;  Laterality: Bilateral;  . TUBAL LIGATION      Allergies  Allergies  Allergen Reactions  . Tape Rash    History of Present Illness    [name] is a 59 y.o. female with PMH as above. ***  Home Medications    Prior to Admission medications   Medication Sig Start Date End Date Taking? Authorizing Provider  aspirin EC 81 MG tablet Take 81 mg by mouth daily.    [provider]  estradiol (VIVELLE-DOT) 0.075 MG/24HR Place 1 patch onto the skin 2 (two) times a week. 12/08/19   Gae Dry, MD  gabapentin (NEURONTIN) 300 MG capsule 300 mg qhs for 1 month. If no side effects, ok to  increase to 600 qhs. Patient not taking: Reported on 12/19/2019 07/17/19   Gillis Santa, MD  glipiZIDE (GLUCOTROL XL) 10 MG 24 hr tablet Take 10 mg by mouth daily. 06/17/18   [provider]  metFORMIN (GLUCOPHAGE) 500 MG tablet Take 500-1,000 mg by mouth 2 (two) times daily.  10/27/16 12/19/19  [provider]  methimazole (TAPAZOLE) 10 MG tablet Take 1 tablet (10 mg total) by mouth 2 (two) times daily. 10/08/15   [name], [name], MD  metoprolol tartrate (LOPRESSOR) 25 MG tablet Take 1 tablet (25 mg total) by mouth 2 (two) times daily. 10/08/15   [name], [name], MD  rosuvastatin (CRESTOR) 5 MG tablet TAKE 1 TABLET BY MOUTH EVERYDAY AT BEDTIME 02/18/20   End, Harrell Gave, MD    Review of Systems    ***.   All other systems reviewed and are otherwise negative except as noted above.  Physical Exam    VS:  LMP 09/22/2017  , BMI There is no height or weight on file to calculate BMI. GEN: Well nourished, well developed, in no acute distress. HEENT: normal. Neck: Supple, no JVD, carotid bruits, or masses. Cardiac: RRR, no murmurs, rubs, or gallops. No clubbing, cyanosis, edema.  Radials/DP/PT 2+ and equal bilaterally.  Respiratory:  Respirations regular and unlabored, clear to auscultation bilaterally. GI: Soft, nontender, nondistended, BS + x 4. MS: no deformity or atrophy. Skin: warm and dry, no rash. Neuro:  Strength and sensation are intact. Psych: Normal affect.  Accessory Clinical Findings    ECG personally reviewed by me today - *** - no acute changes.  VITALS Reviewed today   Temp Readings from Last 3 Encounters:  12/19/19 (!) 97.2 F (36.2 C) (Temporal)  07/17/19 98.8 F (37.1 C) (Oral)  01/30/19 98.3 F (36.8 C) (Oral)   BP Readings from Last 3 Encounters:  12/19/19 128/77  12/08/19 120/80  07/17/19 139/90   Pulse Readings from Last 3 Encounters:  12/19/19 72  07/17/19 77  05/19/19 67    Wt Readings from Last 3 Encounters:  12/19/19 198 lb  (89.8 kg)  12/08/19 201 lb (91.2 kg)  07/17/19 191 lb (86.6 kg)     LABS  reviewed today   CareEverwhere Labs present? {Yes/No:[id number]:::1}  Lab Results  Component Value Date   WBC 5.7 01/29/2019   HGB 16.1 (H) 01/29/2019   HCT 45.7 01/29/2019   MCV 75.2 (L) 01/29/2019   PLT 261 01/29/2019   Lab Results  Component Value Date   CREATININE 0.57 05/16/2019   BUN 12 05/16/2019   NA 135 05/16/2019   K 3.9 05/16/2019   CL 98 05/16/2019   CO2 27 05/16/2019   Lab Results  Component Value Date   ALT 22 10/31/2018   AST 17 10/31/2018   ALKPHOS 107 10/31/2018   BILITOT 0.4 10/31/2018   Lab Results  Component Value Date   CHOL 122 10/08/2015   HDL 31 (L) 10/08/2015   LDLCALC 64 10/08/2015   TRIG 136 10/08/2015   CHOLHDL 3.9 10/08/2015    Lab Results  Component Value Date   HGBA1C 9.6 (H) 09/08/2017   Lab Results  Component Value Date   TSH <0.010 (L) 10/08/2015     STUDIES/PROCEDURES reviewed today   ***  Assessment & Plan    ***  Medication changes: *** Labs ordered: *** Studies / Imaging ordered: *** Future considerations: *** Disposition: ***  Total time spent with patient today *** minutes. This includes reviewing records, evaluating the patient, and coordinating care. Face-to-face time >50%.    Arvil Chaco, PA-C 02/27/2020

## 2020-03-01 ENCOUNTER — Encounter: Payer: Self-pay | Admitting: Physician Assistant

## 2020-03-02 DIAGNOSIS — M545 Low back pain: Secondary | ICD-10-CM | POA: Diagnosis not present

## 2020-03-02 DIAGNOSIS — E0590 Thyrotoxicosis, unspecified without thyrotoxic crisis or storm: Secondary | ICD-10-CM | POA: Diagnosis not present

## 2020-03-02 DIAGNOSIS — I119 Hypertensive heart disease without heart failure: Secondary | ICD-10-CM | POA: Diagnosis not present

## 2020-03-03 ENCOUNTER — Telehealth: Payer: Self-pay

## 2020-03-03 ENCOUNTER — Other Ambulatory Visit: Payer: Self-pay | Admitting: Internal Medicine

## 2020-03-03 NOTE — Telephone Encounter (Signed)
-----   Message from Gae Dry, MD sent at 03/02/2020  4:07 PM EDT ----- Regarding: MMG Received notice she has not received MMG yet as ordered at her Annual. Please check and encourage her to do this, and document conversation.

## 2020-03-03 NOTE — Telephone Encounter (Signed)
Called pt to remind her to schedule her mammogram, she states she will.

## 2020-03-05 DIAGNOSIS — I10 Essential (primary) hypertension: Secondary | ICD-10-CM | POA: Diagnosis not present

## 2020-03-05 DIAGNOSIS — E034 Atrophy of thyroid (acquired): Secondary | ICD-10-CM | POA: Diagnosis not present

## 2020-03-05 DIAGNOSIS — R5381 Other malaise: Secondary | ICD-10-CM | POA: Diagnosis not present

## 2020-03-11 DIAGNOSIS — M545 Low back pain: Secondary | ICD-10-CM | POA: Diagnosis not present

## 2020-03-11 DIAGNOSIS — E119 Type 2 diabetes mellitus without complications: Secondary | ICD-10-CM | POA: Diagnosis not present

## 2020-03-11 DIAGNOSIS — I119 Hypertensive heart disease without heart failure: Secondary | ICD-10-CM | POA: Diagnosis not present

## 2020-04-02 ENCOUNTER — Other Ambulatory Visit: Payer: Self-pay | Admitting: *Deleted

## 2020-04-02 MED ORDER — GLIPIZIDE ER 10 MG PO TB24: 10.0000 mg | ORAL_TABLET | Freq: Every day | ORAL | 3 refills | Status: DC

## 2020-04-06 ENCOUNTER — Other Ambulatory Visit: Payer: Self-pay | Admitting: *Deleted

## 2020-04-06 MED ORDER — GLIPIZIDE ER 10 MG PO TB24: 10.0000 mg | ORAL_TABLET | Freq: Every day | ORAL | 3 refills | Status: DC

## 2020-04-09 ENCOUNTER — Ambulatory Visit: Payer: Medicare HMO | Admitting: Internal Medicine

## 2020-04-17 IMAGING — CT CT ABD-PELV W/ CM
2 of 5 series · 15 of 46 positions shown, 17 images · IV contrast (APPLIED)
Comparison: CT abdomen pelvis 09/10/2017

CLINICAL DATA: Left flank pain.

EXAM:
CT ABDOMEN AND PELVIS WITH CONTRAST
TECHNIQUE: Multidetector CT imaging of the abdomen and pelvis was performed
using the standard protocol following bolus administration of
intravenous contrast.
CONTRAST:  100mL [id number] IOPAMIDOL ([id number]) INJECTION 61%

[Series 2: axial st · axial · 0.86mm/px · z∈[-866,-440]mm · 12 of 97 slices shown, 14 images]
[im 6/97  soft-tissue]
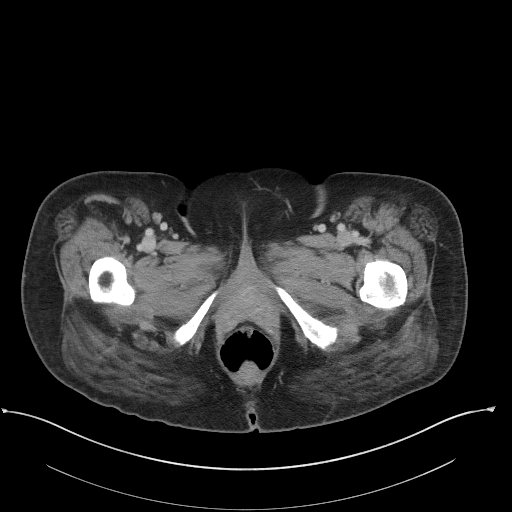
[im 6/97  bone]
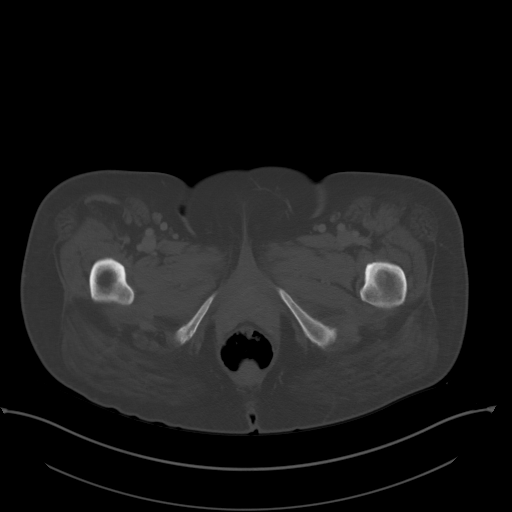
[im 17/97  soft-tissue]
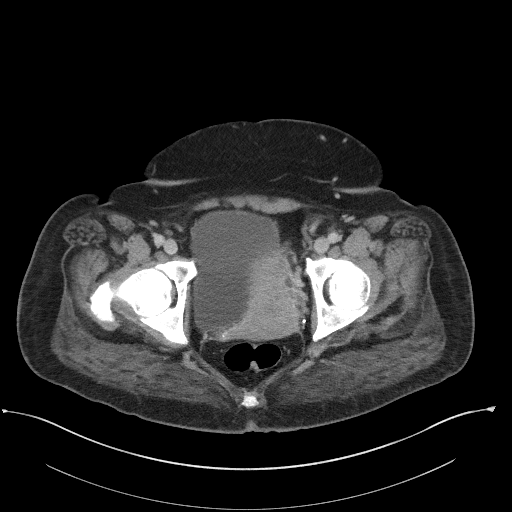
[im 22/97  soft-tissue]
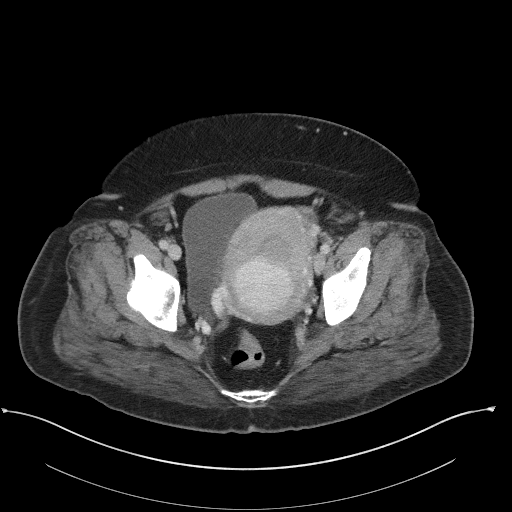
[im 27/97  soft-tissue]
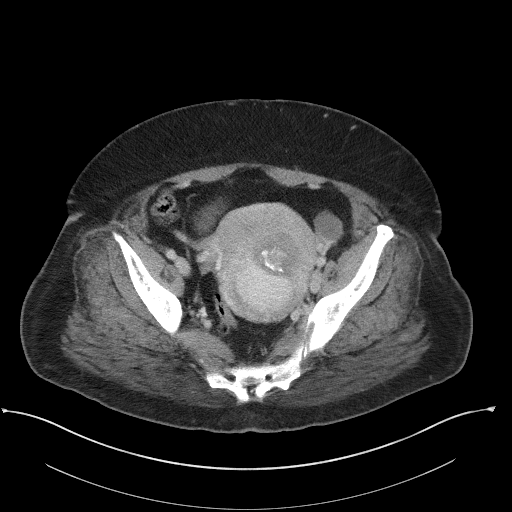
[im 38/97  soft-tissue]
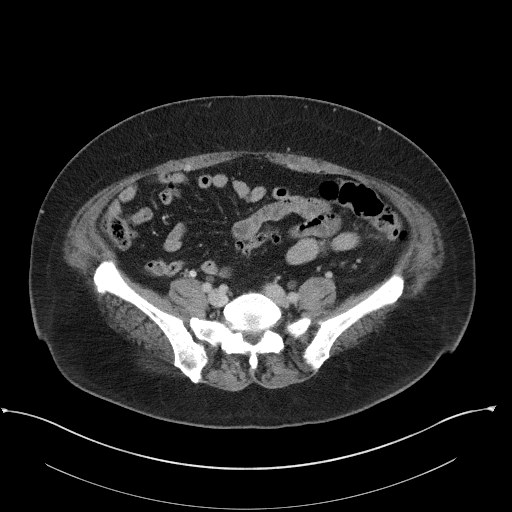
[im 43/97  soft-tissue]
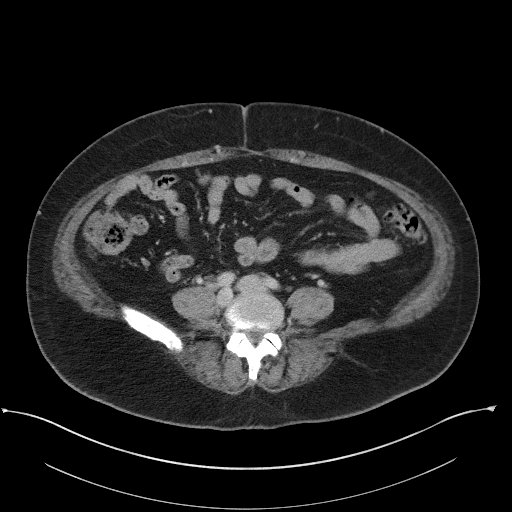
[im 54/97  soft-tissue]
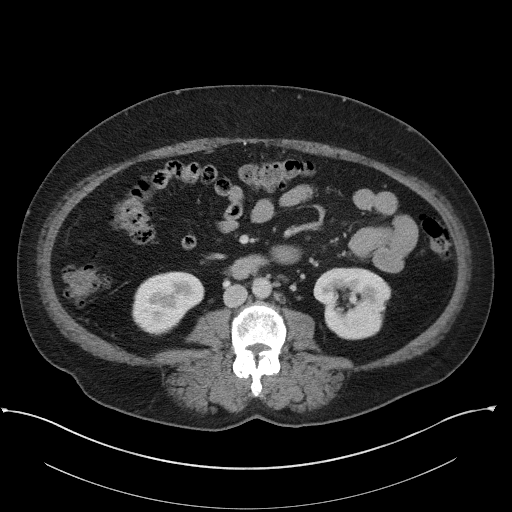
[im 59/97  soft-tissue]
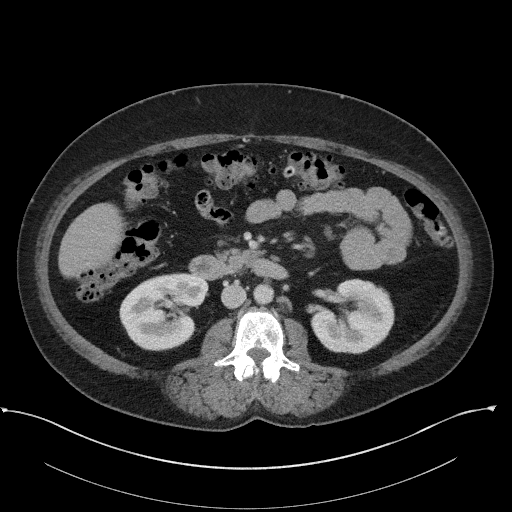
[im 70/97  soft-tissue]
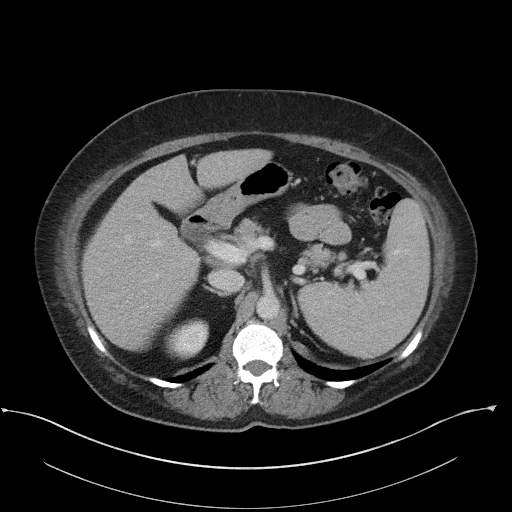
[im 70/97  bone]
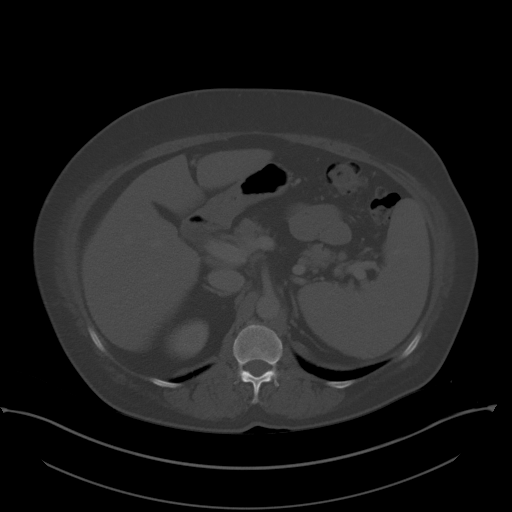
[im 75/97  soft-tissue]
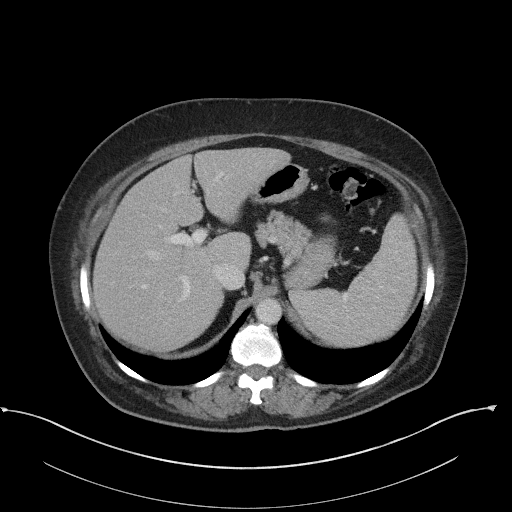
[im 81/97  soft-tissue]
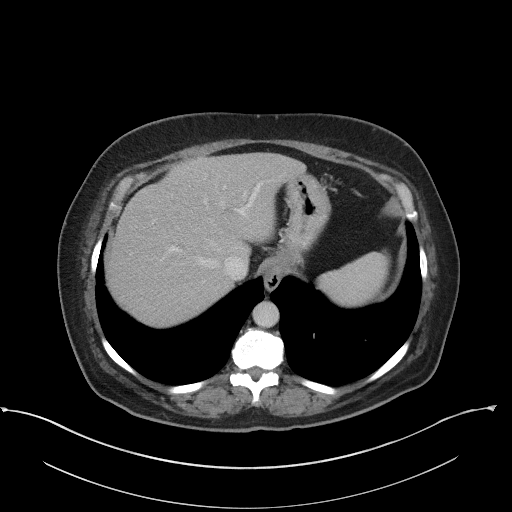
[im 91/97  soft-tissue]
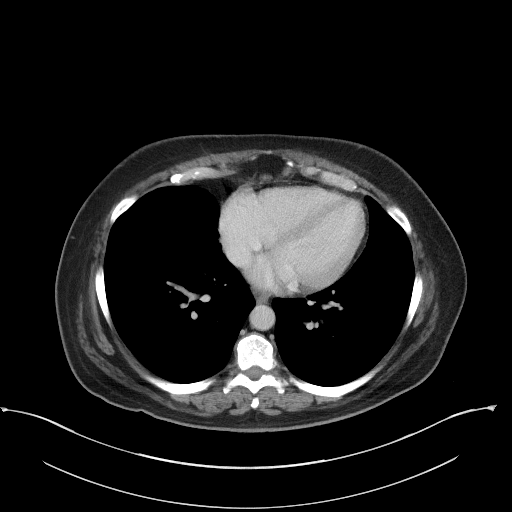

[Series 5: coronal st · coronal · 0.75mm/px · 3 of 89 slices shown]
[im 30/89  soft-tissue]
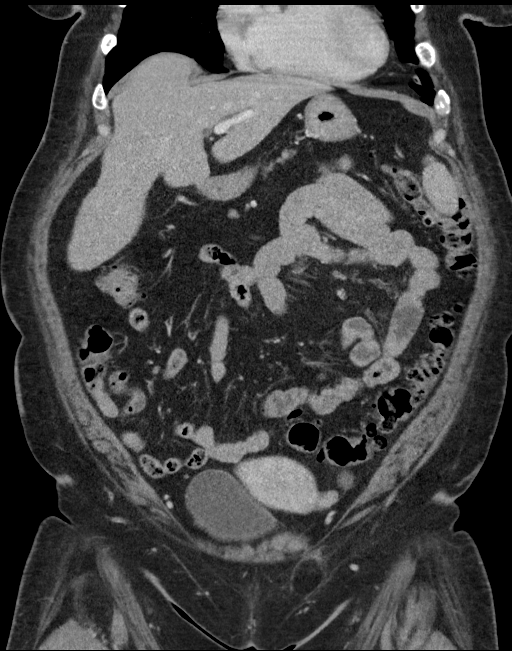
[im 40/89  soft-tissue]
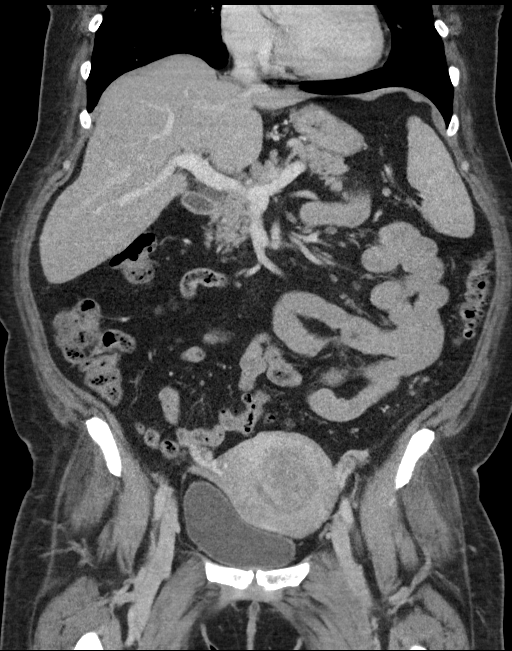
[im 49/89  soft-tissue]
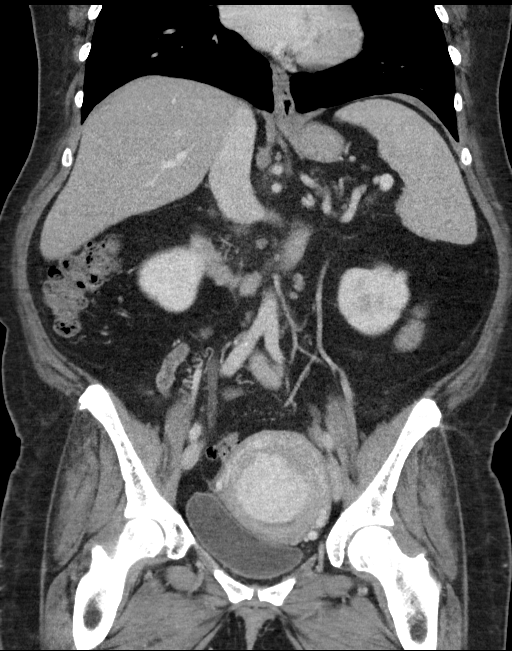

[15 of 46 positions shown; findings below may reference images not displayed]

FINDINGS: LOWER CHEST: There is no basilar pleural or apical pericardial
effusion.

HEPATOBILIARY: The hepatic contours and density are normal. There is
no intra- or extrahepatic biliary dilatation. Status post
cholecystectomy.

PANCREAS: The pancreatic parenchymal contours are normal and there
is no ductal dilatation. There is no peripancreatic fluid
collection.

SPLEEN: Normal.

ADRENALS/URINARY TRACT:

--Adrenal glands: Normal.

--Right kidney/ureter: No hydronephrosis, nephroureterolithiasis,
perinephric stranding or solid renal mass.

--Left kidney/ureter: No hydronephrosis, nephroureterolithiasis,
perinephric stranding or solid renal mass.

--Urinary bladder: Normal for degree of distention

STOMACH/BOWEL:

--Stomach/Duodenum: There is no hiatal hernia or other gastric
abnormality. The duodenal course and caliber are normal.

--Small bowel: No dilatation or inflammation.

--Colon: No focal abnormality.

--Appendix: Normal.

VASCULAR/LYMPHATIC: Normal course and caliber of the major abdominal
vessels. Decreased size of multiple subcentimeter retroperitoneal
lymph nodes.

REPRODUCTIVE: There is a large uterine fibroid that measures up to
7.2 cm and contains calcification. The enhancing component at the
posterior aspect of the fibroid measures up to 5.9 cm, showing
increased enhancement relative to the 06/19/2010 study. There is a 2
cm left ovarian cyst. Right ovary is normal.

MUSCULOSKELETAL. No bony spinal canal stenosis or focal osseous
abnormality.

OTHER: None.
IMPRESSION: 1. No obstructive uropathy or nephrolithiasis.
2. Redemonstration of large uterine fibroid located at the dorsal
uterine corpus. Increased contrast enhancement of part of the
fibroid compared to 06/19/2010 is of uncertain significance and may
be secondary to vascular changes related to degeneration over time
or differences in contrast phase timing between the 2 scans.

## 2020-06-14 ENCOUNTER — Other Ambulatory Visit: Payer: Self-pay | Admitting: *Deleted

## 2020-06-14 DIAGNOSIS — M5136 Other intervertebral disc degeneration, lumbar region: Secondary | ICD-10-CM

## 2020-06-14 DIAGNOSIS — M47816 Spondylosis without myelopathy or radiculopathy, lumbar region: Secondary | ICD-10-CM

## 2020-09-14 ENCOUNTER — Other Ambulatory Visit: Payer: Self-pay | Admitting: Internal Medicine

## 2020-10-06 ENCOUNTER — Ambulatory Visit: Payer: Medicare HMO | Admitting: Family Medicine

## 2020-10-14 ENCOUNTER — Inpatient Hospital Stay: Payer: Medicare HMO | Admitting: Family Medicine

## 2020-10-15 ENCOUNTER — Encounter: Payer: Self-pay | Admitting: Family Medicine

## 2020-10-15 ENCOUNTER — Ambulatory Visit (INDEPENDENT_AMBULATORY_CARE_PROVIDER_SITE_OTHER): Payer: Medicare HMO | Admitting: Family Medicine

## 2020-10-15 ENCOUNTER — Other Ambulatory Visit: Payer: Self-pay

## 2020-10-15 VITALS — BP 148/90 | HR 88 | Ht 62.0 in | Wt 190.2 lb

## 2020-10-15 DIAGNOSIS — Z23 Encounter for immunization: Secondary | ICD-10-CM | POA: Diagnosis not present

## 2020-10-15 DIAGNOSIS — I10 Essential (primary) hypertension: Secondary | ICD-10-CM | POA: Diagnosis not present

## 2020-10-15 DIAGNOSIS — Z09 Encounter for follow-up examination after completed treatment for conditions other than malignant neoplasm: Secondary | ICD-10-CM | POA: Insufficient documentation

## 2020-10-15 NOTE — Progress Notes (Signed)
Established Patient Office Visit  SUBJECTIVE:  Subjective  Patient ID: Kristin Wilson, female    DOB: November 10, 1961  Age: 59 y.o. MRN: [id number]  CC:  Chief Complaint  Patient presents with  . Hospitalization Follow-up    patient was at [facility] with Covid Pneumonia     HPI Kristin Wilson is a 59 y.o. female presenting today for hospital fu after 1 month, no sob reported.   Past Medical History:  Diagnosis Date  . Bronchitis   . Diabetes mellitus without complication ([place])   . Hypertension   . Hyperthyroidism   . Osteoarthritis   . Pyelonephritis   . Sciatica   . Urinary tract infection     Past Surgical History:  Procedure Laterality Date  . ANTERIOR AND POSTERIOR REPAIR WITH SACROSPINOUS FIXATION N/A 09/24/2018   Procedure: POSTERIOR REPAIR;  Surgeon: Gae Dry, MD;  Location: [facility] ORS;  Service: Gynecology;  Laterality: N/A;  . cholecystecomy    . CHOLECYSTECTOMY    . COLONOSCOPY WITH PROPOFOL N/A 12/19/2019   Procedure: COLONOSCOPY WITH PROPOFOL;  Surgeon: Lin Landsman, MD;  Location: [facility] ENDOSCOPY;  Service: Gastroenterology;  Laterality: N/A;  . CYSTOSCOPY N/A 09/24/2018   Procedure: CYSTOSCOPY;  Surgeon: Gae Dry, MD;  Location: [facility] ORS;  Service: Gynecology;  Laterality: N/A;  . LAPAROSCOPIC HYSTERECTOMY Bilateral 09/24/2018   Procedure: HYSTERECTOMY TOTAL LAPAROSCOPIC- BSO;  Surgeon: Gae Dry, MD;  Location: [facility] ORS;  Service: Gynecology;  Laterality: Bilateral;  . TUBAL LIGATION      Family History  Problem Relation Age of Onset  . Coronary artery disease Mother 47  . Diabetic kidney disease Paternal Grandmother   . CAD Paternal Grandmother 8    Social History   Socioeconomic History  . Marital status: Divorced    Spouse name: Not on file  . Number of children: Not on file  . Years of education: Not on file  . Highest education level: Not on file  Occupational History  . Occupation: home maker  Tobacco  Use  . Smoking status: Never Smoker  . Smokeless tobacco: Never Used  Vaping Use  . Vaping Use: Never used  Substance and Sexual Activity  . Alcohol use: No  . Drug use: No  . Sexual activity: Never    Birth control/protection: None  Other Topics Concern  . Not on file  Social History Narrative  . Not on file   Social Determinants of Health   Financial Resource Strain:   . Difficulty of Paying Living Expenses: Not on file  Food Insecurity:   . Worried About Charity fundraiser in the Last Year: Not on file  . Ran Out of Food in the Last Year: Not on file  Transportation Needs:   . Lack of Transportation (Medical): Not on file  . Lack of Transportation (Non-Medical): Not on file  Physical Activity:   . Days of Exercise per Week: Not on file  . Minutes of Exercise per Session: Not on file  Stress:   . Feeling of Stress : Not on file  Social Connections:   . Frequency of Communication with Friends and Family: Not on file  . Frequency of Social Gatherings with Friends and Family: Not on file  . Attends Religious Services: Not on file  . Active Member of Clubs or Organizations: Not on file  . Attends Archivist Meetings: Not on file  . Marital Status: Not on file  Intimate Partner Violence:   .  Fear of Current or Ex-Partner: Not on file  . Emotionally Abused: Not on file  . Physically Abused: Not on file  . Sexually Abused: Not on file     Current Outpatient Medications:  .  ACCU-CHEK GUIDE test strip, CHECK BLOOD SUGAR DAILY, Disp: 100 strip, Rfl: 3 .  aspirin EC 81 MG tablet, Take 81 mg by mouth daily., Disp: , Rfl:  .  estradiol (VIVELLE-DOT) 0.075 MG/24HR, Place 1 patch onto the skin 2 (two) times a week., Disp: 24 patch, Rfl: 4 .  gabapentin (NEURONTIN) 300 MG capsule, 300 mg qhs for 1 month. If no side effects, ok to increase to 600 qhs., Disp: 60 capsule, Rfl: 2 .  glipiZIDE (GLUCOTROL XL) 10 MG 24 hr tablet, Take 1 tablet (10 mg total) by mouth daily.,  Disp: 90 tablet, Rfl: 3 .  losartan (COZAAR) 25 MG tablet, Take 25 mg by mouth daily., Disp: , Rfl:  .  methimazole (TAPAZOLE) 10 MG tablet, Take 1 tablet (10 mg total) by mouth 2 (two) times daily., Disp: 60 tablet, Rfl: 0 .  rosuvastatin (CRESTOR) 5 MG tablet, TAKE 1 TABLET BY MOUTH EVERYDAY AT BEDTIME, Disp: 30 tablet, Rfl: 0 .  metFORMIN (GLUCOPHAGE) 500 MG tablet, Take 500-1,000 mg by mouth 2 (two) times daily. , Disp: , Rfl:    Allergies  Allergen Reactions  . Tape Rash    ROS Review of Systems  Constitutional: Negative.   HENT: Negative.   Eyes: Negative.   Respiratory: Negative.   Cardiovascular: Negative.   Musculoskeletal: Negative.   Neurological: Negative.   All other systems reviewed and are negative.    OBJECTIVE:    Physical Exam Constitutional:      Appearance: She is obese.  HENT:     Head: Normocephalic.     Mouth/Throat:     Mouth: Mucous membranes are moist.  Eyes:     Pupils: Pupils are equal, round, and reactive to light.  Cardiovascular:     Rate and Rhythm: Normal rate.  Pulmonary:     Effort: Pulmonary effort is normal.  Musculoskeletal:        General: Normal range of motion.     Cervical back: Normal range of motion.  Skin:    General: Skin is warm.     Capillary Refill: Capillary refill takes less than 2 seconds.  Neurological:     Mental Status: She is alert.  Psychiatric:        Mood and Affect: Mood normal.     BP (!) 148/90   Pulse 88   Ht 5\' 2"  (1.575 m)   Wt 190 lb 3.2 oz (86.3 kg)   LMP 09/22/2017   BMI 34.79 kg/m  Wt Readings from Last 3 Encounters:  10/15/20 190 lb 3.2 oz (86.3 kg)  12/19/19 198 lb (89.8 kg)  12/08/19 201 lb (91.2 kg)    Health Maintenance Due  Topic Date Due  . Hepatitis C Screening  Never done  . URINE MICROALBUMIN  Never done  . COVID-19 Vaccine (1) Never done  . TETANUS/TDAP  Never done  . MAMMOGRAM  Never done    There are no preventive care reminders to display for this patient.  CBC  Latest Ref Rng & Units 01/29/2019 10/31/2018 09/23/2018  WBC 4.0 - 10.5 K/uL 5.7 6.9 5.3  Hemoglobin 12.0 - 15.0 g/dL 16.1(H) 14.1 15.0  Hematocrit 36 - 46 % 45.7 42.3 43.2  Platelets 150 - 400 K/uL 261 286 244   CMP Latest  Ref Rng & Units 05/16/2019 01/29/2019 10/31/2018  Glucose 70 - 99 mg/dL 352(H) 352(H) 189(H)  BUN 6 - 20 mg/dL 12 13 15   Creatinine 0.44 - 1.00 mg/dL 0.57 0.37(L) 0.42(L)  Sodium 135 - 145 mmol/L 135 138 136  Potassium 3.5 - 5.1 mmol/L 3.9 3.9 4.0  Chloride 98 - 111 mmol/L 98 99 99  CO2 22 - 32 mmol/L 27 26 29   Calcium 8.9 - 10.3 mg/dL 9.3 9.8 9.4  Total Protein 6.5 - 8.1 g/dL - - 7.2  Total Bilirubin 0.3 - 1.2 mg/dL - - 0.4  Alkaline Phos 38 - 126 U/L - - 107  AST 15 - 41 U/L - - 17  ALT 0 - 44 U/L - - 22    Lab Results  Component Value Date   TSH <0.010 (L) 10/08/2015   Lab Results  Component Value Date   ALBUMIN 4.2 10/31/2018   ANIONGAP 10 05/16/2019   Lab Results  Component Value Date   CHOL 122 10/08/2015   HDL 31 (L) 10/08/2015   LDLCALC 64 10/08/2015   CHOLHDL 3.9 10/08/2015   Lab Results  Component Value Date   TRIG 136 10/08/2015   Lab Results  Component Value Date   HGBA1C 9.6 (H) 09/08/2017   HGBA1C 7.7 (H) 10/08/2015      ASSESSMENT & PLAN:   Problem List Items Addressed This Visit      Cardiovascular and Mediastinum   Essential hypertension    Elevated BP today 118/90 on manual recheck, taking meds as rx but has not started PRN Losartan. I am having her start Losartan Scheduled daily and fu 1 month.       Relevant Medications   losartan (COZAAR) 25 MG tablet     Other   Hospital discharge follow-up - Primary    Pt was discharged from [facility] for 6 day stay for Covid 19 Pneumonia. She has been doing well but wanted to be evaluated. No SOB or exercise intolerance.       Need for influenza vaccination   Relevant Orders   Flu Vaccine QUAD 6+ mos PF IM (Fluarix Quad PF) (Completed)      No orders of the defined types  were placed in this encounter.     Follow-up: No follow-ups on file.    Beckie Salts, [place] [address], [place], [place] [address]

## 2020-10-15 NOTE — Assessment & Plan Note (Signed)
Pt was discharged from [facility] for 6 day stay for Covid 19 Pneumonia. She has been doing well but wanted to be evaluated. No SOB or exercise intolerance.

## 2020-10-15 NOTE — Assessment & Plan Note (Signed)
Elevated BP today 118/90 on manual recheck, taking meds as rx but has not started PRN Losartan. I am having her start Losartan Scheduled daily and fu 1 month.

## 2020-11-16 ENCOUNTER — Other Ambulatory Visit: Payer: Self-pay

## 2020-11-16 MED ORDER — METFORMIN HCL 500 MG PO TABS: 500.0000 mg | ORAL_TABLET | Freq: Two times a day (BID) | ORAL | 1 refills | Status: DC

## 2020-12-07 ENCOUNTER — Other Ambulatory Visit: Payer: Self-pay | Admitting: Internal Medicine

## 2020-12-07 MED ORDER — LOSARTAN POTASSIUM 25 MG PO TABS: 25.0000 mg | ORAL_TABLET | Freq: Every day | ORAL | 3 refills | Status: DC

## 2020-12-16 ENCOUNTER — Ambulatory Visit: Payer: Medicare HMO | Admitting: Family Medicine

## 2020-12-21 ENCOUNTER — Other Ambulatory Visit: Payer: Self-pay | Admitting: Internal Medicine

## 2020-12-23 ENCOUNTER — Encounter: Payer: Self-pay | Admitting: Family Medicine

## 2020-12-23 ENCOUNTER — Other Ambulatory Visit: Payer: Self-pay

## 2020-12-23 ENCOUNTER — Ambulatory Visit (INDEPENDENT_AMBULATORY_CARE_PROVIDER_SITE_OTHER): Payer: Medicare HMO | Admitting: Family Medicine

## 2020-12-23 VITALS — BP 140/86 | HR 114 | Ht 62.0 in | Wt 191.5 lb

## 2020-12-23 DIAGNOSIS — E1165 Type 2 diabetes mellitus with hyperglycemia: Secondary | ICD-10-CM | POA: Insufficient documentation

## 2020-12-23 DIAGNOSIS — I10 Essential (primary) hypertension: Secondary | ICD-10-CM

## 2020-12-23 DIAGNOSIS — E669 Obesity, unspecified: Secondary | ICD-10-CM

## 2020-12-23 MED ORDER — ACCU-CHEK FASTCLIX LANCETS MISC: 6 refills | Status: AC

## 2020-12-23 NOTE — Progress Notes (Signed)
Established Patient Office Visit  SUBJECTIVE:  Subjective  Patient ID: Kristin Wilson, female    DOB: 1961-07-21  Age: 60 y.o. MRN: [id number]  CC:  Chief Complaint  Patient presents with  . Hypertension    Patient is here for blood pressure recheck. Patient was started on [place]in at last office visit and told to follow up in 1 month.    HPI Kristin Wilson is a 60 y.o. female presenting today for     Past Medical History:  Diagnosis Date  . Bronchitis   . Diabetes mellitus without complication ([place])   . Hypertension   . Hyperthyroidism   . Osteoarthritis   . Pyelonephritis   . Sciatica   . Urinary tract infection     Past Surgical History:  Procedure Laterality Date  . ANTERIOR AND POSTERIOR REPAIR WITH SACROSPINOUS FIXATION N/A 09/24/2018   Procedure: POSTERIOR REPAIR;  Surgeon: Gae Dry, MD;  Location: [facility] ORS;  Service: Gynecology;  Laterality: N/A;  . cholecystecomy    . CHOLECYSTECTOMY    . COLONOSCOPY WITH PROPOFOL N/A 12/19/2019   Procedure: COLONOSCOPY WITH PROPOFOL;  Surgeon: Lin Landsman, MD;  Location: [facility] ENDOSCOPY;  Service: Gastroenterology;  Laterality: N/A;  . CYSTOSCOPY N/A 09/24/2018   Procedure: CYSTOSCOPY;  Surgeon: Gae Dry, MD;  Location: [facility] ORS;  Service: Gynecology;  Laterality: N/A;  . LAPAROSCOPIC HYSTERECTOMY Bilateral 09/24/2018   Procedure: HYSTERECTOMY TOTAL LAPAROSCOPIC- BSO;  Surgeon: Gae Dry, MD;  Location: [facility] ORS;  Service: Gynecology;  Laterality: Bilateral;  . TUBAL LIGATION      Family History  Problem Relation Age of Onset  . Coronary artery disease Mother 61  . Diabetic kidney disease Paternal Grandmother   . CAD Paternal Grandmother 76    Social History   Socioeconomic History  . Marital status: Divorced    Spouse name: Not on file  . Number of children: Not on file  . Years of education: Not on file  . Highest education level: Not on file  Occupational History  .  Occupation: home maker  Tobacco Use  . Smoking status: Never Smoker  . Smokeless tobacco: Never Used  Vaping Use  . Vaping Use: Never used  Substance and Sexual Activity  . Alcohol use: No  . Drug use: No  . Sexual activity: Never    Birth control/protection: None  Other Topics Concern  . Not on file  Social History Narrative  . Not on file   Social Determinants of Health   Financial Resource Strain: Not on file  Food Insecurity: Not on file  Transportation Needs: Not on file  Physical Activity: Not on file  Stress: Not on file  Social Connections: Not on file  Intimate Partner Violence: Not on file     Current Outpatient Medications:  .  ACCU-CHEK GUIDE test strip, CHECK BLOOD SUGAR DAILY, Disp: 100 strip, Rfl: 3 .  aspirin EC 81 MG tablet, Take 81 mg by mouth daily., Disp: , Rfl:  .  estradiol (VIVELLE-DOT) 0.075 MG/24HR, Place 1 patch onto the skin 2 (two) times a week., Disp: 24 patch, Rfl: 4 .  gabapentin (NEURONTIN) 300 MG capsule, 300 mg qhs for 1 month. If no side effects, ok to increase to 600 qhs., Disp: 60 capsule, Rfl: 2 .  glipiZIDE (GLUCOTROL XL) 10 MG 24 hr tablet, Take 1 tablet (10 mg total) by mouth daily., Disp: 90 tablet, Rfl: 3 .  losartan (COZAAR) 25 MG tablet, Take 1 tablet (25  mg total) by mouth daily., Disp: 90 tablet, Rfl: 3 .  metFORMIN (GLUCOPHAGE) 500 MG tablet, Take 1 tablet (500 mg total) by mouth 2 (two) times daily., Disp: 120 tablet, Rfl: 1 .  methimazole (TAPAZOLE) 10 MG tablet, Take 1 tablet (10 mg total) by mouth 2 (two) times daily., Disp: 60 tablet, Rfl: 0 .  rosuvastatin (CRESTOR) 5 MG tablet, TAKE 1 TABLET BY MOUTH EVERYDAY AT BEDTIME, Disp: 30 tablet, Rfl: 0 .  Accu-Chek FastClix Lancets MISC, USE TO CHECK BLOOD SUGAR DAILY, Disp: 102 each, Rfl: 6   Allergies  Allergen Reactions  . Tape Rash    ROS Review of Systems  Constitutional: Negative.   HENT: Negative.   Respiratory: Negative.   Cardiovascular: Negative.    Gastrointestinal: Negative.   Genitourinary: Negative.   Musculoskeletal: Negative.   Neurological: Negative.   Psychiatric/Behavioral: Negative.      OBJECTIVE:    Physical Exam Vitals and nursing note reviewed.  Constitutional:      Appearance: She is obese.  HENT:     Head: Normocephalic.     Nose: Nose normal.  Eyes:     Pupils: Pupils are equal, round, and reactive to light.  Cardiovascular:     Rate and Rhythm: Normal rate.  Musculoskeletal:        General: Normal range of motion.     Cervical back: Normal range of motion.  Skin:    General: Skin is warm.     Capillary Refill: Capillary refill takes less than 2 seconds.  Neurological:     Mental Status: She is alert.  Psychiatric:        Mood and Affect: Mood normal.     BP 140/86   Pulse (!) 114   Ht 5\' 2"  (1.575 m)   Wt 191 lb 8 oz (86.9 kg)   LMP 09/22/2017   BMI 35.03 kg/m  Wt Readings from Last 3 Encounters:  12/23/20 191 lb 8 oz (86.9 kg)  10/15/20 190 lb 3.2 oz (86.3 kg)  12/19/19 198 lb (89.8 kg)    Health Maintenance Due  Topic Date Due  . Hepatitis C Screening  Never done  . COVID-19 Vaccine (1) Never done  . FOOT EXAM  Never done  . OPHTHALMOLOGY EXAM  Never done  . TETANUS/TDAP  Never done  . MAMMOGRAM  Never done    There are no preventive care reminders to display for this patient.  CBC Latest Ref Rng & Units 12/23/2020 01/29/2019 10/31/2018  WBC 3.8 - 10.8 Thousand/uL 7.1 5.7 6.9  Hemoglobin 11.7 - 15.5 g/dL 15.9(H) 16.1(H) 14.1  Hematocrit 35.0 - 45.0 % 46.7(H) 45.7 42.3  Platelets 140 - 400 Thousand/uL 307 261 286   CMP Latest Ref Rng & Units 12/23/2020 05/16/2019 01/29/2019  Glucose 65 - 99 mg/dL 236(H) 352(H) 352(H)  BUN 7 - 25 mg/dL 15 12 13   Creatinine 0.50 - 1.05 mg/dL 0.60 0.57 0.37(L)  Sodium 135 - 146 mmol/L 139 135 138  Potassium 3.5 - 5.3 mmol/L 4.6 3.9 3.9  Chloride 98 - 110 mmol/L 97(L) 98 99  CO2 20 - 32 mmol/L 20 27 26   Calcium 8.6 - 10.4 mg/dL 10.3 9.3 9.8  Total  Protein 6.1 - 8.1 g/dL 7.4 - -  Total Bilirubin 0.2 - 1.2 mg/dL 0.8 - -  Alkaline Phos 38 - 126 U/L - - -  AST 10 - 35 U/L 19 - -  ALT 6 - 29 U/L 20 - -    Lab Results  Component Value Date   TSH 9.64 (H) 12/23/2020   Lab Results  Component Value Date   ALBUMIN 4.2 10/31/2018   ANIONGAP 10 05/16/2019   Lab Results  Component Value Date   CHOL 122 10/08/2015   HDL 31 (L) 10/08/2015   LDLCALC 64 10/08/2015   CHOLHDL 3.9 10/08/2015   Lab Results  Component Value Date   TRIG 136 10/08/2015   Lab Results  Component Value Date   HGBA1C 7.7 (H) 12/23/2020   HGBA1C 9.6 (H) 09/08/2017   HGBA1C 7.7 (H) 10/08/2015      ASSESSMENT & PLAN:   Problem List Items Addressed This Visit      Cardiovascular and Mediastinum   Essential hypertension - Primary    Patient's blood pressure is not within the desired range.  An office visit is recommended. Medication side effects include: no side effects noted Continue current treatment regimen. Dietary sodium restriction.       Relevant Orders   COMPLETE METABOLIC PANEL WITH GFR (Completed)   CBC (Completed)   CBC with Differential/Platelet     Endocrine   Type 2 diabetes mellitus with hyperglycemia, without long-term current use of insulin (HCC)    A1C 7.7, taking all meds, diet not at goal. I am going to fu with patient to discuss Rybelsus for goal of less than 6.5% A1C.       Relevant Orders   HgB A1c (Completed)   Hemoglobin A1c    Other Visit Diagnoses    Obesity (BMI 35.0-39.9 without comorbidity)       Relevant Orders   TSH (Completed)   COMPLETE METABOLIC PANEL WITH GFR   TSH      Meds ordered this encounter  Medications  . Accu-Chek FastClix Lancets MISC    Sig: USE TO CHECK BLOOD SUGAR DAILY    Dispense:  102 each    Refill:  6      Follow-up: No follow-ups on file.    Beckie Salts, [place] [address], [place], [place] [address]

## 2020-12-24 LAB — CBC
HCT: 46.7 % — ABNORMAL HIGH (ref 35.0–45.0)
Hemoglobin: 15.9 g/dL — ABNORMAL HIGH (ref 11.7–15.5)
MCH: 30.1 pg (ref 27.0–33.0)
MCHC: 34 g/dL (ref 32.0–36.0)
MCV: 88.3 fL (ref 80.0–100.0)
MPV: 11.3 fL (ref 7.5–12.5)
Platelets: 307 10*3/uL (ref 140–400)
RBC: 5.29 10*6/uL — ABNORMAL HIGH (ref 3.80–5.10)
RDW: 13.5 % (ref 11.0–15.0)
WBC: 7.1 10*3/uL (ref 3.8–10.8)

## 2020-12-24 LAB — COMPLETE METABOLIC PANEL WITH GFR
AG Ratio: 1.6 (calc) (ref 1.0–2.5)
ALT: 20 U/L (ref 6–29)
AST: 19 U/L (ref 10–35)
Albumin: 4.6 g/dL (ref 3.6–5.1)
Alkaline phosphatase (APISO): 55 U/L (ref 37–153)
BUN: 15 mg/dL (ref 7–25)
CO2: 20 mmol/L (ref 20–32)
Calcium: 10.3 mg/dL (ref 8.6–10.4)
Chloride: 97 mmol/L — ABNORMAL LOW (ref 98–110)
Creat: 0.6 mg/dL (ref 0.50–1.05)
GFR, Est African American: 117 mL/min/{1.73_m2} (ref >=60)
GFR, Est Non African American: 101 mL/min/{1.73_m2} (ref >=60)
Globulin: 2.8 g/dL (calc) (ref 1.9–3.7)
Glucose, Bld: 236 mg/dL — ABNORMAL HIGH (ref 65–99)
Potassium: 4.6 mmol/L (ref 3.5–5.3)
Sodium: 139 mmol/L (ref 135–146)
Total Bilirubin: 0.8 mg/dL (ref 0.2–1.2)
Total Protein: 7.4 g/dL (ref 6.1–8.1)

## 2020-12-24 LAB — HEMOGLOBIN A1C
Hgb A1c MFr Bld: 7.7 % of total Hgb — ABNORMAL HIGH (ref <5.7)
Mean Plasma Glucose: 174 mg/dL
eAG (mmol/L): 9.7 mmol/L

## 2020-12-24 LAB — SPECIMEN COMPROMISED

## 2020-12-24 LAB — TSH: TSH: 9.64 mIU/L — ABNORMAL HIGH (ref 0.40–4.50)

## 2020-12-30 NOTE — Assessment & Plan Note (Signed)
A1C 7.7, taking all meds, diet not at goal. I am going to fu with patient to discuss Rybelsus for goal of less than 6.5% A1C.

## 2020-12-30 NOTE — Assessment & Plan Note (Signed)
Patient's blood pressure is not within the desired range.  An office visit is recommended. Medication side effects include: no side effects noted Continue current treatment regimen. Dietary sodium restriction.  

## 2020-12-31 ENCOUNTER — Encounter: Payer: Self-pay | Admitting: Family Medicine

## 2020-12-31 ENCOUNTER — Ambulatory Visit (INDEPENDENT_AMBULATORY_CARE_PROVIDER_SITE_OTHER): Payer: Medicare HMO | Admitting: Family Medicine

## 2020-12-31 ENCOUNTER — Other Ambulatory Visit: Payer: Self-pay

## 2020-12-31 VITALS — BP 135/64 | HR 83 | Ht 62.0 in | Wt 191.9 lb

## 2020-12-31 DIAGNOSIS — I10 Essential (primary) hypertension: Secondary | ICD-10-CM | POA: Diagnosis not present

## 2020-12-31 DIAGNOSIS — E66811 Obesity, class 1: Secondary | ICD-10-CM | POA: Insufficient documentation

## 2020-12-31 DIAGNOSIS — E669 Obesity, unspecified: Secondary | ICD-10-CM | POA: Insufficient documentation

## 2020-12-31 DIAGNOSIS — E1165 Type 2 diabetes mellitus with hyperglycemia: Secondary | ICD-10-CM

## 2020-12-31 MED ORDER — RYBELSUS 3 MG PO TABS: 3.0000 mg | ORAL_TABLET | Freq: Every day | ORAL | 4 refills | Status: DC

## 2020-12-31 NOTE — Assessment & Plan Note (Signed)
Diabetes mellitus Type II, under poor control.. Discussed general issues about diabetes pathophysiology and management. Neurosurgeon distributed. Suggested low cholesterol diet.   Discussed adding Rybelsus 3 mg. Today.

## 2020-12-31 NOTE — Assessment & Plan Note (Signed)
Pt with increased weight of 1 lb since November, will start low carb diet.

## 2020-12-31 NOTE — Progress Notes (Signed)
Established Patient Office Visit  SUBJECTIVE:  Subjective  Patient ID: Kristin Wilson, female    DOB: 07/09/1961  Age: 60 y.o. MRN: [id number]  CC:  Chief Complaint  Patient presents with  . Lab Results    HPI Kristin Wilson is a 60 y.o. female presenting today for     Past Medical History:  Diagnosis Date  . Bronchitis   . Diabetes mellitus without complication ([place])   . Hypertension   . Hyperthyroidism   . Osteoarthritis   . Pyelonephritis   . Sciatica   . Urinary tract infection     Past Surgical History:  Procedure Laterality Date  . ANTERIOR AND POSTERIOR REPAIR WITH SACROSPINOUS FIXATION N/A 09/24/2018   Procedure: POSTERIOR REPAIR;  Surgeon: Gae Dry, MD;  Location: [facility] ORS;  Service: Gynecology;  Laterality: N/A;  . cholecystecomy    . CHOLECYSTECTOMY    . COLONOSCOPY WITH PROPOFOL N/A 12/19/2019   Procedure: COLONOSCOPY WITH PROPOFOL;  Surgeon: Lin Landsman, MD;  Location: [facility] ENDOSCOPY;  Service: Gastroenterology;  Laterality: N/A;  . CYSTOSCOPY N/A 09/24/2018   Procedure: CYSTOSCOPY;  Surgeon: Gae Dry, MD;  Location: [facility] ORS;  Service: Gynecology;  Laterality: N/A;  . LAPAROSCOPIC HYSTERECTOMY Bilateral 09/24/2018   Procedure: HYSTERECTOMY TOTAL LAPAROSCOPIC- BSO;  Surgeon: Gae Dry, MD;  Location: [facility] ORS;  Service: Gynecology;  Laterality: Bilateral;  . TUBAL LIGATION      Family History  Problem Relation Age of Onset  . Coronary artery disease Mother 25  . Diabetic kidney disease Paternal Grandmother   . CAD Paternal Grandmother 42    Social History   Socioeconomic History  . Marital status: Divorced    Spouse name: Not on file  . Number of children: Not on file  . Years of education: Not on file  . Highest education level: Not on file  Occupational History  . Occupation: home maker  Tobacco Use  . Smoking status: Never Smoker  . Smokeless tobacco: Never Used  Vaping Use  . Vaping Use: Never  used  Substance and Sexual Activity  . Alcohol use: No  . Drug use: No  . Sexual activity: Never    Birth control/protection: None  Other Topics Concern  . Not on file  Social History Narrative  . Not on file   Social Determinants of Health   Financial Resource Strain: Not on file  Food Insecurity: Not on file  Transportation Needs: Not on file  Physical Activity: Not on file  Stress: Not on file  Social Connections: Not on file  Intimate Partner Violence: Not on file     Current Outpatient Medications:  .  Semaglutide (RYBELSUS) 3 MG TABS, Take 3 mg by mouth daily., Disp: 30 tablet, Rfl: 4 .  Accu-Chek FastClix Lancets MISC, USE TO CHECK BLOOD SUGAR DAILY, Disp: 102 each, Rfl: 6 .  ACCU-CHEK GUIDE test strip, CHECK BLOOD SUGAR DAILY, Disp: 100 strip, Rfl: 3 .  aspirin EC 81 MG tablet, Take 81 mg by mouth daily., Disp: , Rfl:  .  estradiol (VIVELLE-DOT) 0.075 MG/24HR, Place 1 patch onto the skin 2 (two) times a week., Disp: 24 patch, Rfl: 4 .  gabapentin (NEURONTIN) 300 MG capsule, 300 mg qhs for 1 month. If no side effects, ok to increase to 600 qhs., Disp: 60 capsule, Rfl: 2 .  glipiZIDE (GLUCOTROL XL) 10 MG 24 hr tablet, Take 1 tablet (10 mg total) by mouth daily., Disp: 90 tablet, Rfl: 3 .  losartan (COZAAR) 25 MG tablet, Take 1 tablet (25 mg total) by mouth daily., Disp: 90 tablet, Rfl: 3 .  metFORMIN (GLUCOPHAGE) 500 MG tablet, Take 1 tablet (500 mg total) by mouth 2 (two) times daily., Disp: 120 tablet, Rfl: 1 .  methimazole (TAPAZOLE) 10 MG tablet, Take 1 tablet (10 mg total) by mouth 2 (two) times daily., Disp: 60 tablet, Rfl: 0 .  rosuvastatin (CRESTOR) 5 MG tablet, TAKE 1 TABLET BY MOUTH EVERYDAY AT BEDTIME, Disp: 30 tablet, Rfl: 0   Allergies  Allergen Reactions  . Tape Rash    ROS Review of Systems  Constitutional: Negative.   HENT: Negative.   Respiratory: Negative.   Cardiovascular: Negative.   Genitourinary: Negative.   Musculoskeletal: Negative.    Neurological: Negative.   Psychiatric/Behavioral: Negative.      OBJECTIVE:    Physical Exam HENT:     Head: Normocephalic.     Right Ear: Tympanic membrane normal.     Mouth/Throat:     Mouth: Mucous membranes are moist.  Eyes:     Pupils: Pupils are equal, round, and reactive to light.  Cardiovascular:     Rate and Rhythm: Normal rate and regular rhythm.     Pulses: Normal pulses.  Pulmonary:     Effort: Pulmonary effort is normal.  Abdominal:     General: Abdomen is flat.  Musculoskeletal:        General: Normal range of motion.     Cervical back: Normal range of motion.  Skin:    General: Skin is warm.     Coloration: Skin is jaundiced.  Neurological:     General: No focal deficit present.     Mental Status: She is alert.  Psychiatric:        Mood and Affect: Mood normal.     BP 135/64   Pulse 83   Ht 5\' 2"  (1.575 m)   Wt 191 lb 14.4 oz (87 kg)   LMP 09/22/2017   BMI 35.10 kg/m  Wt Readings from Last 3 Encounters:  12/31/20 191 lb 14.4 oz (87 kg)  12/23/20 191 lb 8 oz (86.9 kg)  10/15/20 190 lb 3.2 oz (86.3 kg)    Health Maintenance Due  Topic Date Due  . Hepatitis C Screening  Never done  . COVID-19 Vaccine (1) Never done  . FOOT EXAM  Never done  . OPHTHALMOLOGY EXAM  Never done  . TETANUS/TDAP  Never done  . MAMMOGRAM  Never done    There are no preventive care reminders to display for this patient.  CBC Latest Ref Rng & Units 12/23/2020 01/29/2019 10/31/2018  WBC 3.8 - 10.8 Thousand/uL 7.1 5.7 6.9  Hemoglobin 11.7 - 15.5 g/dL 15.9(H) 16.1(H) 14.1  Hematocrit 35.0 - 45.0 % 46.7(H) 45.7 42.3  Platelets 140 - 400 Thousand/uL 307 261 286   CMP Latest Ref Rng & Units 12/23/2020 05/16/2019 01/29/2019  Glucose 65 - 99 mg/dL 236(H) 352(H) 352(H)  BUN 7 - 25 mg/dL 15 12 13   Creatinine 0.50 - 1.05 mg/dL 0.60 0.57 0.37(L)  Sodium 135 - 146 mmol/L 139 135 138  Potassium 3.5 - 5.3 mmol/L 4.6 3.9 3.9  Chloride 98 - 110 mmol/L 97(L) 98 99  CO2 20 - 32  mmol/L 20 27 26   Calcium 8.6 - 10.4 mg/dL 10.3 9.3 9.8  Total Protein 6.1 - 8.1 g/dL 7.4 - -  Total Bilirubin 0.2 - 1.2 mg/dL 0.8 - -  Alkaline Phos 38 - 126 U/L - - -  AST 10 - 35 U/L 19 - -  ALT 6 - 29 U/L 20 - -    Lab Results  Component Value Date   TSH 9.64 (H) 12/23/2020   Lab Results  Component Value Date   ALBUMIN 4.2 10/31/2018   ANIONGAP 10 05/16/2019   Lab Results  Component Value Date   CHOL 122 10/08/2015   HDL 31 (L) 10/08/2015   LDLCALC 64 10/08/2015   CHOLHDL 3.9 10/08/2015   Lab Results  Component Value Date   TRIG 136 10/08/2015   Lab Results  Component Value Date   HGBA1C 7.7 (H) 12/23/2020   HGBA1C 9.6 (H) 09/08/2017   HGBA1C 7.7 (H) 10/08/2015      ASSESSMENT & PLAN:   Problem List Items Addressed This Visit      Cardiovascular and Mediastinum   Essential hypertension    Patient with HTN well controlled, no CP or SOB recently.         Endocrine   Type 2 diabetes mellitus with hyperglycemia, without long-term current use of insulin (HCC)    Diabetes mellitus Type II, under poor control.. Discussed general issues about diabetes pathophysiology and management. Neurosurgeon distributed. Suggested low cholesterol diet.   Discussed adding Rybelsus 3 mg. Today.       Relevant Medications   Semaglutide (RYBELSUS) 3 MG TABS     Other   Obesity (BMI 35.0-39.9 without comorbidity) - Primary    Pt with increased weight of 1 lb since November, will start low carb diet.       Relevant Medications   Semaglutide (RYBELSUS) 3 MG TABS      Meds ordered this encounter  Medications  . Semaglutide (RYBELSUS) 3 MG TABS    Sig: Take 3 mg by mouth daily.    Dispense:  30 tablet    Refill:  4      Follow-up: No follow-ups on file.    Beckie Salts, [place] [address], [place], [place] [address]

## 2020-12-31 NOTE — Assessment & Plan Note (Signed)
Patient with HTN well controlled, no CP or SOB recently.

## 2021-01-24 ENCOUNTER — Other Ambulatory Visit: Payer: Self-pay

## 2021-01-24 MED ORDER — METFORMIN HCL 500 MG PO TABS: 1000.0000 mg | ORAL_TABLET | Freq: Two times a day (BID) | ORAL | 1 refills | Status: DC

## 2021-02-03 ENCOUNTER — Other Ambulatory Visit: Payer: Self-pay

## 2021-02-03 ENCOUNTER — Encounter: Payer: Self-pay | Admitting: Family Medicine

## 2021-02-03 ENCOUNTER — Ambulatory Visit (INDEPENDENT_AMBULATORY_CARE_PROVIDER_SITE_OTHER): Payer: Medicare HMO | Admitting: Family Medicine

## 2021-02-03 VITALS — BP 177/94 | HR 66 | Ht 62.0 in | Wt 197.6 lb

## 2021-02-03 DIAGNOSIS — M47816 Spondylosis without myelopathy or radiculopathy, lumbar region: Secondary | ICD-10-CM | POA: Diagnosis not present

## 2021-02-03 DIAGNOSIS — E1165 Type 2 diabetes mellitus with hyperglycemia: Secondary | ICD-10-CM

## 2021-02-03 DIAGNOSIS — H9201 Otalgia, right ear: Secondary | ICD-10-CM

## 2021-02-03 MED ORDER — CYCLOBENZAPRINE HCL 10 MG PO TABS: 10.0000 mg | ORAL_TABLET | Freq: Three times a day (TID) | ORAL | 0 refills | Status: DC | PRN

## 2021-02-03 NOTE — Progress Notes (Signed)
Established Patient Office Visit  SUBJECTIVE:  Subjective  Patient ID: Kristin Wilson, female    DOB: [birth year]  Age: 60 y.o. MRN: [id number]  CC:  Chief Complaint  Patient presents with  . Ear Pain    Patient complains of right ear pain x2 days.     HPI Kristin Wilson is a 60 y.o. female presenting today for     Past Medical History:  Diagnosis Date  . Bronchitis   . Diabetes mellitus without complication ([place])   . Hypertension   . Hyperthyroidism   . Osteoarthritis   . Pyelonephritis   . Sciatica   . Urinary tract infection     Past Surgical History:  Procedure Laterality Date  . ANTERIOR AND POSTERIOR REPAIR WITH SACROSPINOUS FIXATION N/A 09/24/2018   Procedure: POSTERIOR REPAIR;  Surgeon: Gae Dry, MD;  Location: [facility] ORS;  Service: Gynecology;  Laterality: N/A;  . cholecystecomy    . CHOLECYSTECTOMY    . COLONOSCOPY WITH PROPOFOL N/A 12/19/2019   Procedure: COLONOSCOPY WITH PROPOFOL;  Surgeon: Lin Landsman, MD;  Location: [facility] ENDOSCOPY;  Service: Gastroenterology;  Laterality: N/A;  . CYSTOSCOPY N/A 09/24/2018   Procedure: CYSTOSCOPY;  Surgeon: Gae Dry, MD;  Location: [facility] ORS;  Service: Gynecology;  Laterality: N/A;  . LAPAROSCOPIC HYSTERECTOMY Bilateral 09/24/2018   Procedure: HYSTERECTOMY TOTAL LAPAROSCOPIC- BSO;  Surgeon: Gae Dry, MD;  Location: [facility] ORS;  Service: Gynecology;  Laterality: Bilateral;  . TUBAL LIGATION      Family History  Problem Relation Age of Onset  . Coronary artery disease Mother 75  . Diabetic kidney disease Paternal Grandmother   . CAD Paternal Grandmother 29    Social History   Socioeconomic History  . Marital status: Divorced    Spouse name: Not on file  . Number of children: Not on file  . Years of education: Not on file  . Highest education level: Not on file  Occupational History  . Occupation: home maker  Tobacco Use  . Smoking status: Never Smoker  . Smokeless tobacco:  Never Used  Vaping Use  . Vaping Use: Never used  Substance and Sexual Activity  . Alcohol use: No  . Drug use: No  . Sexual activity: Never    Birth control/protection: None  Other Topics Concern  . Not on file  Social History Narrative  . Not on file   Social Determinants of Health   Financial Resource Strain: Not on file  Food Insecurity: Not on file  Transportation Needs: Not on file  Physical Activity: Not on file  Stress: Not on file  Social Connections: Not on file  Intimate Partner Violence: Not on file     Current Outpatient Medications:  .  cyclobenzaprine (FLEXERIL) 10 MG tablet, Take 1 tablet (10 mg total) by mouth 3 (three) times daily as needed for muscle spasms., Disp: 30 tablet, Rfl: 0 .  Accu-Chek FastClix Lancets MISC, USE TO CHECK BLOOD SUGAR DAILY, Disp: 102 each, Rfl: 6 .  ACCU-CHEK GUIDE test strip, CHECK BLOOD SUGAR DAILY, Disp: 100 strip, Rfl: 3 .  aspirin EC 81 MG tablet, Take 81 mg by mouth daily., Disp: , Rfl:  .  estradiol (VIVELLE-DOT) 0.075 MG/24HR, Place 1 patch onto the skin 2 (two) times a week., Disp: 24 patch, Rfl: 4 .  gabapentin (NEURONTIN) 300 MG capsule, 300 mg qhs for 1 month. If no side effects, ok to increase to 600 qhs., Disp: 60 capsule, Rfl: 2 .  glipiZIDE (  GLUCOTROL XL) 10 MG 24 hr tablet, Take 1 tablet (10 mg total) by mouth daily., Disp: 90 tablet, Rfl: 3 .  losartan (COZAAR) 25 MG tablet, Take 1 tablet (25 mg total) by mouth daily., Disp: 90 tablet, Rfl: 3 .  metFORMIN (GLUCOPHAGE) 500 MG tablet, Take 2 tablets (1,000 mg total) by mouth 2 (two) times daily., Disp: 120 tablet, Rfl: 1 .  methimazole (TAPAZOLE) 10 MG tablet, Take 1 tablet (10 mg total) by mouth 2 (two) times daily., Disp: 60 tablet, Rfl: 0 .  rosuvastatin (CRESTOR) 5 MG tablet, TAKE 1 TABLET BY MOUTH EVERYDAY AT BEDTIME, Disp: 30 tablet, Rfl: 0 .  Semaglutide (RYBELSUS) 3 MG TABS, Take 3 mg by mouth daily., Disp: 30 tablet, Rfl: 4   Allergies  Allergen Reactions   . Tape Rash    ROS Review of Systems  Constitutional: Negative.   HENT: Negative.   Respiratory: Negative.   Gastrointestinal: Negative.   Genitourinary: Negative.   Musculoskeletal: Positive for back pain.  Hematological: Negative.   Psychiatric/Behavioral: Negative.      OBJECTIVE:    Physical Exam Constitutional:      Appearance: Normal appearance.  HENT:     Nose: Nose normal.  Eyes:     Pupils: Pupils are equal, round, and reactive to light.  Cardiovascular:     Rate and Rhythm: Normal rate and regular rhythm.  Musculoskeletal:        General: Tenderness present.     Cervical back: Tenderness present.  Neurological:     Mental Status: She is alert.  Psychiatric:        Mood and Affect: Mood normal.     BP (!) 177/94   Pulse 66   Ht 5\' 2"  (1.575 m)   Wt 197 lb 9.6 oz (89.6 kg)   LMP 09/22/2017   BMI 36.14 kg/m  Wt Readings from Last 3 Encounters:  02/03/21 197 lb 9.6 oz (89.6 kg)  12/31/20 191 lb 14.4 oz (87 kg)  12/23/20 191 lb 8 oz (86.9 kg)    Health Maintenance Due  Topic Date Due  . Hepatitis C Screening  Never done  . COVID-19 Vaccine (1) Never done  . FOOT EXAM  Never done  . OPHTHALMOLOGY EXAM  Never done  . TETANUS/TDAP  Never done  . MAMMOGRAM  Never done    There are no preventive care reminders to display for this patient.  CBC Latest Ref Rng & Units 12/23/2020 01/29/2019 10/31/2018  WBC 3.8 - 10.8 Thousand/uL 7.1 5.7 6.9  Hemoglobin 11.7 - 15.5 g/dL 15.9(H) 16.1(H) 14.1  Hematocrit 35.0 - 45.0 % 46.7(H) 45.7 42.3  Platelets 140 - 400 Thousand/uL 307 261 286   CMP Latest Ref Rng & Units 12/23/2020 05/16/2019 01/29/2019  Glucose 65 - 99 mg/dL 236(H) 352(H) 352(H)  BUN 7 - 25 mg/dL 15 12 13   Creatinine 0.50 - 1.05 mg/dL 0.60 0.57 0.37(L)  Sodium 135 - 146 mmol/L 139 135 138  Potassium 3.5 - 5.3 mmol/L 4.6 3.9 3.9  Chloride 98 - 110 mmol/L 97(L) 98 99  CO2 20 - 32 mmol/L 20 27 26   Calcium 8.6 - 10.4 mg/dL 10.3 9.3 9.8  Total Protein  6.1 - 8.1 g/dL 7.4 - -  Total Bilirubin 0.2 - 1.2 mg/dL 0.8 - -  Alkaline Phos 38 - 126 U/L - - -  AST 10 - 35 U/L 19 - -  ALT 6 - 29 U/L 20 - -    Lab Results  Component Value  Date   TSH 9.64 (H) 12/23/2020   Lab Results  Component Value Date   ALBUMIN 4.2 10/31/2018   ANIONGAP 10 05/16/2019   Lab Results  Component Value Date   CHOL 122 10/08/2015   HDL 31 (L) 10/08/2015   LDLCALC 64 10/08/2015   CHOLHDL 3.9 10/08/2015   Lab Results  Component Value Date   TRIG 136 10/08/2015   Lab Results  Component Value Date   HGBA1C 7.7 (H) 12/23/2020   HGBA1C 9.6 (H) 09/08/2017   HGBA1C 7.7 (H) 10/08/2015      ASSESSMENT & PLAN:   Problem List Items Addressed This Visit      Endocrine   Type 2 diabetes mellitus with hyperglycemia, without long-term current use of insulin (HCC)    Since starting Rybelsus her glucose has decreased steadily to 130 and 120. Will do A1C in April.        Musculoskeletal and Integument   Lumbar spondylosis    Patient with increased lumbar and thoracic pain x 3 months. She says that bending and moving hurts more. She is requesting repeat x rays or ref.  Plan- Will repeat Lumbar and thoracic spine.       Relevant Medications   cyclobenzaprine (FLEXERIL) 10 MG tablet   Other Relevant Orders   DG Lumbar Spine Complete   DG Thoracic Spine W/Swimmers     Other   Right ear pain - Primary    Patient still has fluid behind R ear, discussed that over time the fluid will decrease and pressure will go away.          Meds ordered this encounter  Medications  . cyclobenzaprine (FLEXERIL) 10 MG tablet    Sig: Take 1 tablet (10 mg total) by mouth 3 (three) times daily as needed for muscle spasms.    Dispense:  30 tablet    Refill:  0      Follow-up: No follow-ups on file.    Beckie Salts, [place] [address], [place], [place] [address]

## 2021-02-03 NOTE — Assessment & Plan Note (Signed)
Patient with increased lumbar and thoracic pain x 3 months. She says that bending and moving hurts more. She is requesting repeat x rays or ref.  Plan- Will repeat Lumbar and thoracic spine.

## 2021-02-03 NOTE — Assessment & Plan Note (Signed)
Since starting Rybelsus her glucose has decreased steadily to 130 and 120. Will do A1C in April.

## 2021-02-03 NOTE — Assessment & Plan Note (Signed)
Patient still has fluid behind R ear, discussed that over time the fluid will decrease and pressure will go away.

## 2021-02-04 ENCOUNTER — Ambulatory Visit
Admission: RE | Admit: 2021-02-04 | Discharge: 2021-02-04 | Disposition: A | Discharge status: Home / Self Care | Payer: Medicare HMO | Source: Ambulatory Visit | Attending: Family Medicine | Admitting: Family Medicine

## 2021-02-04 DIAGNOSIS — M47816 Spondylosis without myelopathy or radiculopathy, lumbar region: Secondary | ICD-10-CM

## 2021-02-15 ENCOUNTER — Other Ambulatory Visit: Payer: Self-pay | Admitting: Internal Medicine

## 2021-02-17 ENCOUNTER — Other Ambulatory Visit: Payer: Self-pay | Admitting: Internal Medicine

## 2021-03-11 ENCOUNTER — Other Ambulatory Visit: Payer: Self-pay | Admitting: Internal Medicine

## 2021-03-24 ENCOUNTER — Ambulatory Visit: Payer: Medicare HMO | Admitting: Internal Medicine

## 2021-03-31 ENCOUNTER — Encounter: Payer: Self-pay | Admitting: Family Medicine

## 2021-03-31 ENCOUNTER — Other Ambulatory Visit: Payer: Self-pay

## 2021-03-31 ENCOUNTER — Ambulatory Visit (INDEPENDENT_AMBULATORY_CARE_PROVIDER_SITE_OTHER): Payer: Medicare Other | Admitting: Family Medicine

## 2021-03-31 VITALS — BP 133/88 | HR 82 | Ht 62.0 in | Wt 190.7 lb

## 2021-03-31 DIAGNOSIS — M545 Low back pain, unspecified: Secondary | ICD-10-CM

## 2021-03-31 DIAGNOSIS — E1165 Type 2 diabetes mellitus with hyperglycemia: Secondary | ICD-10-CM | POA: Diagnosis not present

## 2021-03-31 DIAGNOSIS — G8929 Other chronic pain: Secondary | ICD-10-CM | POA: Diagnosis not present

## 2021-03-31 LAB — HM DIABETES EYE EXAM

## 2021-03-31 LAB — GLUCOSE, POCT (MANUAL RESULT ENTRY): POC Glucose: 193 mg/dl — AB (ref 70–99)

## 2021-03-31 MED ORDER — OZEMPIC (0.25 OR 0.5 MG/DOSE) 2 MG/1.5ML ~~LOC~~ SOPN: 0.5000 mg | PEN_INJECTOR | SUBCUTANEOUS | 2 refills | Status: DC

## 2021-03-31 NOTE — Addendum Note (Signed)
Addended by: Anson [place] R on: 03/31/2021 10:30 AM   Modules accepted: Orders

## 2021-03-31 NOTE — Assessment & Plan Note (Signed)
Patient is on disability for degenerative disc disease. She is having increasing pain and has not been in with a pain mangement center for some time now. Her level of pain is not allowing her to perform normal activities of daily living.   Plan- Will refer to pain management for evaluation.

## 2021-03-31 NOTE — Assessment & Plan Note (Signed)
Diabetes mellitus Type II, under fair control.. Discussed general issues about diabetes pathophysiology and management. Patient has been on Ozempic 0.25 x 1 month now. She has not had an A1C in 3 months. POC glucose 193 today.

## 2021-03-31 NOTE — Progress Notes (Signed)
Established Patient Office Visit  SUBJECTIVE:  Subjective  Patient ID: Kristin Wilson, female    DOB: 1961-05-12  Age: 60 y.o. MRN: [id number]  CC:  Chief Complaint  Patient presents with  . right ear pain    Patient feels that she has fluid in her ear   . Back Pain    HPI Kristin Wilson is a 60 y.o. female presenting today for DM re evaluation and Back Pain.   Past Medical History:  Diagnosis Date  . Bronchitis   . Diabetes mellitus without complication ([place])   . Hypertension   . Hyperthyroidism   . Osteoarthritis   . Pyelonephritis   . Sciatica   . Urinary tract infection     Past Surgical History:  Procedure Laterality Date  . ANTERIOR AND POSTERIOR REPAIR WITH SACROSPINOUS FIXATION N/A 09/24/2018   Procedure: POSTERIOR REPAIR;  Surgeon: Gae Dry, MD;  Location: [facility] ORS;  Service: Gynecology;  Laterality: N/A;  . cholecystecomy    . CHOLECYSTECTOMY    . COLONOSCOPY WITH PROPOFOL N/A 12/19/2019   Procedure: COLONOSCOPY WITH PROPOFOL;  Surgeon: Lin Landsman, MD;  Location: [facility] ENDOSCOPY;  Service: Gastroenterology;  Laterality: N/A;  . CYSTOSCOPY N/A 09/24/2018   Procedure: CYSTOSCOPY;  Surgeon: Gae Dry, MD;  Location: [facility] ORS;  Service: Gynecology;  Laterality: N/A;  . LAPAROSCOPIC HYSTERECTOMY Bilateral 09/24/2018   Procedure: HYSTERECTOMY TOTAL LAPAROSCOPIC- BSO;  Surgeon: Gae Dry, MD;  Location: [facility] ORS;  Service: Gynecology;  Laterality: Bilateral;  . TUBAL LIGATION      Family History  Problem Relation Age of Onset  . Coronary artery disease Mother 77  . Diabetic kidney disease Paternal Grandmother   . CAD Paternal Grandmother 9    Social History   Socioeconomic History  . Marital status: Divorced    Spouse name: Not on file  . Number of children: Not on file  . Years of education: Not on file  . Highest education level: Not on file  Occupational History  . Occupation: home maker  Tobacco Use  . Smoking  status: Never Smoker  . Smokeless tobacco: Never Used  Vaping Use  . Vaping Use: Never used  Substance and Sexual Activity  . Alcohol use: No  . Drug use: No  . Sexual activity: Never    Birth control/protection: None  Other Topics Concern  . Not on file  Social History Narrative  . Not on file   Social Determinants of Health   Financial Resource Strain: Not on file  Food Insecurity: Not on file  Transportation Needs: Not on file  Physical Activity: Not on file  Stress: Not on file  Social Connections: Not on file  Intimate Partner Violence: Not on file     Current Outpatient Medications:  .  Accu-Chek FastClix Lancets MISC, USE TO CHECK BLOOD SUGAR DAILY, Disp: 102 each, Rfl: 6 .  ACCU-CHEK GUIDE test strip, CHECK BLOOD SUGAR DAILY, Disp: 100 strip, Rfl: 3 .  aspirin EC 81 MG tablet, Take 81 mg by mouth daily., Disp: , Rfl:  .  cyclobenzaprine (FLEXERIL) 10 MG tablet, Take 1 tablet (10 mg total) by mouth 3 (three) times daily as needed for muscle spasms., Disp: 30 tablet, Rfl: 0 .  estradiol (VIVELLE-DOT) 0.075 MG/24HR, Place 1 patch onto the skin 2 (two) times a week., Disp: 24 patch, Rfl: 4 .  gabapentin (NEURONTIN) 300 MG capsule, 300 mg qhs for 1 month. If no side effects, ok to increase to  600 qhs., Disp: 60 capsule, Rfl: 2 .  glipiZIDE (GLUCOTROL XL) 10 MG 24 hr tablet, Take 1 tablet (10 mg total) by mouth daily., Disp: 90 tablet, Rfl: 3 .  losartan (COZAAR) 25 MG tablet, Take 1 tablet (25 mg total) by mouth daily., Disp: 90 tablet, Rfl: 3 .  metFORMIN (GLUCOPHAGE) 500 MG tablet, TAKE 2 TABLETS BY MOUTH TWICE A DAY, Disp: 120 tablet, Rfl: 1 .  methimazole (TAPAZOLE) 10 MG tablet, TAKE 1 TABLET (10 MG TOTAL) BY MOUTH TWO (2) TIMES A DAY., Disp: 180 tablet, Rfl: 1 .  rosuvastatin (CRESTOR) 5 MG tablet, TAKE 1 TABLET BY MOUTH EVERYDAY AT BEDTIME, Disp: 30 tablet, Rfl: 0 .  Semaglutide (RYBELSUS) 3 MG TABS, Take 3 mg by mouth daily., Disp: 30 tablet, Rfl: 4   Allergies   Allergen Reactions  . Tape Rash    ROS Review of Systems  Constitutional: Negative.   HENT: Positive for congestion.   Respiratory: Negative.   Cardiovascular: Negative.   Genitourinary: Negative.   Musculoskeletal: Negative.   Psychiatric/Behavioral: Negative.      OBJECTIVE:    Physical Exam Vitals and nursing note reviewed.  HENT:     Mouth/Throat:     Mouth: Mucous membranes are moist.  Cardiovascular:     Rate and Rhythm: Normal rate.  Musculoskeletal:        General: Tenderness present.     Cervical back: Normal and normal range of motion.     Thoracic back: Decreased range of motion.     Lumbar back: Decreased range of motion. Positive right straight leg raise test and positive left straight leg raise test.  Neurological:     Mental Status: She is alert.     BP 133/88   Pulse 82   Ht 5\' 2"  (1.575 m)   Wt 190 lb 11.2 oz (86.5 kg)   LMP 09/22/2017   BMI 34.88 kg/m  Wt Readings from Last 3 Encounters:  03/31/21 190 lb 11.2 oz (86.5 kg)  02/03/21 197 lb 9.6 oz (89.6 kg)  12/31/20 191 lb 14.4 oz (87 kg)    Health Maintenance Due  Topic Date Due  . Hepatitis C Screening  Never done  . COVID-19 Vaccine (1) Never done  . FOOT EXAM  Never done  . OPHTHALMOLOGY EXAM  Never done  . TETANUS/TDAP  Never done  . MAMMOGRAM  Never done    There are no preventive care reminders to display for this patient.  CBC Latest Ref Rng & Units 12/23/2020 01/29/2019 10/31/2018  WBC 3.8 - 10.8 Thousand/uL 7.1 5.7 6.9  Hemoglobin 11.7 - 15.5 g/dL 15.9(H) 16.1(H) 14.1  Hematocrit 35.0 - 45.0 % 46.7(H) 45.7 42.3  Platelets 140 - 400 Thousand/uL 307 261 286   CMP Latest Ref Rng & Units 12/23/2020 05/16/2019 01/29/2019  Glucose 65 - 99 mg/dL 236(H) 352(H) 352(H)  BUN 7 - 25 mg/dL 15 12 13   Creatinine 0.50 - 1.05 mg/dL 0.60 0.57 0.37(L)  Sodium 135 - 146 mmol/L 139 135 138  Potassium 3.5 - 5.3 mmol/L 4.6 3.9 3.9  Chloride 98 - 110 mmol/L 97(L) 98 99  CO2 20 - 32 mmol/L 20 27 26    Calcium 8.6 - 10.4 mg/dL 10.3 9.3 9.8  Total Protein 6.1 - 8.1 g/dL 7.4 - -  Total Bilirubin 0.2 - 1.2 mg/dL 0.8 - -  Alkaline Phos 38 - 126 U/L - - -  AST 10 - 35 U/L 19 - -  ALT 6 - 29 U/L 20 - -  Lab Results  Component Value Date   TSH 9.64 (H) 12/23/2020   Lab Results  Component Value Date   ALBUMIN 4.2 10/31/2018   ANIONGAP 10 05/16/2019   Lab Results  Component Value Date   CHOL 122 10/08/2015   HDL 31 (L) 10/08/2015   LDLCALC 64 10/08/2015   CHOLHDL 3.9 10/08/2015   Lab Results  Component Value Date   TRIG 136 10/08/2015   Lab Results  Component Value Date   HGBA1C 7.7 (H) 12/23/2020   HGBA1C 9.6 (H) 09/08/2017   HGBA1C 7.7 (H) 10/08/2015      ASSESSMENT & PLAN:   Problem List Items Addressed This Visit      Endocrine   Type 2 diabetes mellitus with hyperglycemia, without long-term current use of insulin ([facility]) - Primary    Diabetes mellitus Type II, under fair control.. Discussed general issues about diabetes pathophysiology and management. Patient has been on Ozempic 0.25 x 1 month now. She has not had an A1C in 3 months. POC glucose 193 today.       Relevant Orders   POCT glucose (manual entry) (Completed)     Other   Chronic bilateral low back pain without sciatica    Patient is on disability for degenerative disc disease. She is having increasing pain and has not been in with a pain mangement center for some time now. Her level of pain is not allowing her to perform normal activities of daily living.   Plan- Will refer to pain management for evaluation.          No orders of the defined types were placed in this encounter.   Follow-up: No follow-ups on file.    Beckie Salts, [facility] [address] [place], [place] [address]

## 2021-04-01 LAB — HEMOGLOBIN A1C
Hgb A1c MFr Bld: 6.8 % of total Hgb — ABNORMAL HIGH (ref <5.7)
Mean Plasma Glucose: 148 mg/dL
eAG (mmol/L): 8.2 mmol/L

## 2021-04-05 ENCOUNTER — Other Ambulatory Visit: Payer: Self-pay | Admitting: Internal Medicine

## 2021-04-13 ENCOUNTER — Other Ambulatory Visit: Payer: Self-pay | Admitting: Internal Medicine

## 2021-04-13 DIAGNOSIS — Z139 Encounter for screening, unspecified: Secondary | ICD-10-CM

## 2021-04-18 ENCOUNTER — Encounter: Payer: Self-pay | Admitting: Cardiology

## 2021-05-05 ENCOUNTER — Other Ambulatory Visit: Payer: Self-pay | Admitting: Internal Medicine

## 2021-05-31 DIAGNOSIS — G510 Bell's palsy: Secondary | ICD-10-CM | POA: Diagnosis not present

## 2021-05-31 DIAGNOSIS — R9431 Abnormal electrocardiogram [ECG] [EKG]: Secondary | ICD-10-CM | POA: Diagnosis not present

## 2021-05-31 DIAGNOSIS — R29810 Facial weakness: Secondary | ICD-10-CM | POA: Diagnosis not present

## 2021-06-01 DIAGNOSIS — R000 Tachycardia, unspecified: Secondary | ICD-10-CM | POA: Diagnosis not present

## 2021-06-03 ENCOUNTER — Other Ambulatory Visit: Payer: Self-pay | Admitting: Internal Medicine

## 2021-06-09 ENCOUNTER — Other Ambulatory Visit: Payer: Self-pay | Admitting: Internal Medicine

## 2021-06-13 ENCOUNTER — Other Ambulatory Visit: Payer: Self-pay | Admitting: Internal Medicine

## 2021-06-16 ENCOUNTER — Ambulatory Visit: Payer: Medicare Other | Admitting: Student in an Organized Health Care Education/Training Program

## 2021-07-01 ENCOUNTER — Ambulatory Visit: Payer: Medicare HMO | Admitting: Family Medicine

## 2021-07-04 ENCOUNTER — Other Ambulatory Visit: Payer: Self-pay | Admitting: Family Medicine

## 2021-07-06 ENCOUNTER — Other Ambulatory Visit: Payer: Self-pay | Admitting: Internal Medicine

## 2021-07-14 ENCOUNTER — Other Ambulatory Visit: Payer: Self-pay

## 2021-07-14 ENCOUNTER — Ambulatory Visit
Payer: Medicare Other | Attending: Student in an Organized Health Care Education/Training Program | Admitting: Student in an Organized Health Care Education/Training Program

## 2021-07-14 ENCOUNTER — Encounter: Payer: Self-pay | Admitting: Student in an Organized Health Care Education/Training Program

## 2021-07-14 VITALS — BP 138/83 | HR 84 | Temp 97.1°F | Resp 16 | Ht 62.0 in | Wt 187.0 lb

## 2021-07-14 DIAGNOSIS — G894 Chronic pain syndrome: Secondary | ICD-10-CM | POA: Insufficient documentation

## 2021-07-14 DIAGNOSIS — M5136 Other intervertebral disc degeneration, lumbar region: Secondary | ICD-10-CM | POA: Diagnosis not present

## 2021-07-14 DIAGNOSIS — M47816 Spondylosis without myelopathy or radiculopathy, lumbar region: Secondary | ICD-10-CM | POA: Insufficient documentation

## 2021-07-14 MED ORDER — GABAPENTIN 300 MG PO CAPS: 300.0000 mg | ORAL_CAPSULE | Freq: Every day | ORAL | 2 refills | Status: DC

## 2021-07-14 MED ORDER — CELECOXIB 100 MG PO CAPS: 100.0000 mg | ORAL_CAPSULE | Freq: Two times a day (BID) | ORAL | 1 refills | Status: AC

## 2021-07-14 NOTE — Patient Instructions (Signed)
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
## 2021-07-14 NOTE — Progress Notes (Signed)
Safety precautions to be maintained throughout the outpatient stay will include: orient to surroundings, keep bed in low position, maintain call bell within reach at all times, provide assistance with transfer out of bed and ambulation.  

## 2021-07-27 ENCOUNTER — Other Ambulatory Visit: Payer: Self-pay

## 2021-07-27 ENCOUNTER — Encounter: Payer: Self-pay | Admitting: Internal Medicine

## 2021-07-27 ENCOUNTER — Ambulatory Visit (INDEPENDENT_AMBULATORY_CARE_PROVIDER_SITE_OTHER): Payer: Medicare Other | Admitting: Internal Medicine

## 2021-07-27 ENCOUNTER — Ambulatory Visit: Payer: Medicare Other | Admitting: Internal Medicine

## 2021-07-27 VITALS — BP 146/95 | HR 81 | Ht 62.0 in | Wt 193.6 lb

## 2021-07-27 DIAGNOSIS — E1165 Type 2 diabetes mellitus with hyperglycemia: Secondary | ICD-10-CM

## 2021-07-27 DIAGNOSIS — E119 Type 2 diabetes mellitus without complications: Secondary | ICD-10-CM | POA: Diagnosis not present

## 2021-07-27 DIAGNOSIS — G8929 Other chronic pain: Secondary | ICD-10-CM

## 2021-07-27 DIAGNOSIS — M545 Low back pain, unspecified: Secondary | ICD-10-CM | POA: Diagnosis not present

## 2021-07-27 DIAGNOSIS — I10 Essential (primary) hypertension: Secondary | ICD-10-CM

## 2021-07-27 DIAGNOSIS — M47816 Spondylosis without myelopathy or radiculopathy, lumbar region: Secondary | ICD-10-CM

## 2021-07-27 DIAGNOSIS — E669 Obesity, unspecified: Secondary | ICD-10-CM

## 2021-07-27 LAB — POCT GLYCOSYLATED HEMOGLOBIN (HGB A1C): HbA1c POC (<> result, manual entry): 6 % (ref 4.0–5.6)

## 2021-07-27 MED ORDER — PREGABALIN 50 MG PO CAPS: 50.0000 mg | ORAL_CAPSULE | Freq: Three times a day (TID) | ORAL | 1 refills | Status: DC

## 2021-07-27 NOTE — Assessment & Plan Note (Signed)

## 2021-07-27 NOTE — Assessment & Plan Note (Signed)

## 2021-07-27 NOTE — Assessment & Plan Note (Signed)
-   Patient's back pain is under control with medication.  - Encouraged the patient to stretch or do yoga as able to help with back pain 

## 2021-07-27 NOTE — Assessment & Plan Note (Signed)

## 2021-07-27 NOTE — Assessment & Plan Note (Signed)
Refer to the pain specialist patient was started on Lyrica

## 2021-07-27 NOTE — Progress Notes (Signed)
Established Patient Office Visit  Subjective:  Patient ID: Kristin Wilson, female    DOB: 12-Oct-1961  Age: 60 y.o. MRN: [id number]  CC:  Chief Complaint  Patient presents with   Diabetes    Diabetes Hypoglycemia symptoms include headaches. Pertinent negatives for hypoglycemia include no speech difficulty. Pertinent negatives for diabetes include no chest pain.  Back Pain This is a chronic problem. The current episode started more than 1 year ago. The problem occurs daily. The problem has been gradually worsening since onset. The pain is present in the sacro-iliac. The pain is at a severity of 5/10. The symptoms are aggravated by sitting, twisting and standing. Associated symptoms include headaches. Pertinent negatives include no bowel incontinence, chest pain or pelvic pain.   Kristin Wilson presents for back pain  Past Medical History:  Diagnosis Date   Bronchitis    Diabetes mellitus without complication ([place])    Hypertension    Hyperthyroidism    Osteoarthritis    Pyelonephritis    Sciatica    Urinary tract infection     Past Surgical History:  Procedure Laterality Date   ANTERIOR AND POSTERIOR REPAIR WITH SACROSPINOUS FIXATION N/A 09/24/2018   Procedure: POSTERIOR REPAIR;  Surgeon: Gae Dry, MD;  Location: [facility] ORS;  Service: Gynecology;  Laterality: N/A;   cholecystecomy     CHOLECYSTECTOMY     COLONOSCOPY WITH PROPOFOL N/A 12/19/2019   Procedure: COLONOSCOPY WITH PROPOFOL;  Surgeon: Lin Landsman, MD;  Location: [facility] ENDOSCOPY;  Service: Gastroenterology;  Laterality: N/A;   CYSTOSCOPY N/A 09/24/2018   Procedure: CYSTOSCOPY;  Surgeon: Gae Dry, MD;  Location: [facility] ORS;  Service: Gynecology;  Laterality: N/A;   LAPAROSCOPIC HYSTERECTOMY Bilateral 09/24/2018   Procedure: HYSTERECTOMY TOTAL LAPAROSCOPIC- BSO;  Surgeon: Gae Dry, MD;  Location: [facility] ORS;  Service: Gynecology;  Laterality: Bilateral;   TUBAL LIGATION      Family  History  Problem Relation Age of Onset   Coronary artery disease Mother 25   Diabetic kidney disease Paternal Grandmother    CAD Paternal Grandmother 24    Social History   Socioeconomic History   Marital status: Divorced    Spouse name: Not on file   Number of children: Not on file   Years of education: Not on file   Highest education level: Not on file  Occupational History   Occupation: home maker  Tobacco Use   Smoking status: Never   Smokeless tobacco: Never  Vaping Use   Vaping Use: Never used  Substance and Sexual Activity   Alcohol use: No   Drug use: No   Sexual activity: Never    Birth control/protection: None  Other Topics Concern   Not on file  Social History Narrative   Not on file   Social Determinants of Health   Financial Resource Strain: Not on file  Food Insecurity: Not on file  Transportation Needs: Not on file  Physical Activity: Not on file  Stress: Not on file  Social Connections: Not on file  Intimate Partner Violence: Not on file     Current Outpatient Medications:    Accu-Chek FastClix Lancets MISC, USE TO CHECK BLOOD SUGAR DAILY, Disp: 102 each, Rfl: 6   ACCU-CHEK GUIDE test strip, USE TO CHECK BLOOD SUGAR DAILY, Disp: 100 strip, Rfl: 3   acetaminophen (TYLENOL) 500 MG tablet, Take by mouth every 6 (six) hours as needed., Disp: , Rfl:    aspirin EC 81 MG tablet, Take 81 mg by  mouth daily., Disp: , Rfl:    celecoxib (CELEBREX) 100 MG capsule, Take 1 capsule (100 mg total) by mouth 2 (two) times daily., Disp: 60 capsule, Rfl: 1   cyclobenzaprine (FLEXERIL) 10 MG tablet, Take 1 tablet (10 mg total) by mouth 3 (three) times daily as needed for muscle spasms., Disp: 30 tablet, Rfl: 0   estradiol (VIVELLE-DOT) 0.075 MG/24HR, Place 1 patch onto the skin 2 (two) times a week., Disp: 24 patch, Rfl: 4   glipiZIDE (GLUCOTROL XL) 10 MG 24 hr tablet, TAKE 1 TABLET BY MOUTH EVERY DAY, Disp: 90 tablet, Rfl: 2   losartan (COZAAR) 25 MG tablet, Take 1  tablet (25 mg total) by mouth daily., Disp: 90 tablet, Rfl: 3   metFORMIN (GLUCOPHAGE) 500 MG tablet, TAKE 2 TABLETS BY MOUTH TWICE A DAY, Disp: 120 tablet, Rfl: 1   methimazole (TAPAZOLE) 10 MG tablet, TAKE 1 TABLET (10 MG TOTAL) BY MOUTH TWO (2) TIMES A DAY., Disp: 180 tablet, Rfl: 1   OZEMPIC, 0.25 OR 0.5 MG/DOSE, 2 MG/1.5ML SOPN, INJECT 0.5 MG INTO THE SKIN ONCE A WEEK., Disp: 1.5 mL, Rfl: 2   pregabalin (LYRICA) 50 MG capsule, Take 1 capsule (50 mg total) by mouth 3 (three) times daily., Disp: 90 capsule, Rfl: 1   Allergies  Allergen Reactions   Tape Rash    ROS Review of Systems  Constitutional: Negative.   HENT: Negative.    Eyes: Negative.   Respiratory: Negative.    Cardiovascular: Negative.  Negative for chest pain.  Gastrointestinal: Negative.  Negative for bowel incontinence.  Endocrine: Negative.   Genitourinary: Negative.  Negative for pelvic pain.  Musculoskeletal:  Positive for back pain.  Skin: Negative.   Allergic/Immunologic: Negative.   Neurological:  Positive for headaches. Negative for speech difficulty.  Hematological: Negative.   Psychiatric/Behavioral: Negative.  Negative for behavioral problems.   All other systems reviewed and are negative.    Objective:    Physical Exam Vitals reviewed.  Constitutional:      Appearance: Normal appearance.  HENT:     Mouth/Throat:     Mouth: Mucous membranes are moist.  Eyes:     Pupils: Pupils are equal, round, and reactive to light.  Neck:     Vascular: No carotid bruit.  Cardiovascular:     Rate and Rhythm: Normal rate and regular rhythm.     Pulses: Normal pulses.     Heart sounds: Normal heart sounds.  Pulmonary:     Effort: Pulmonary effort is normal.     Breath sounds: Normal breath sounds.  Abdominal:     General: Bowel sounds are normal.     Palpations: Abdomen is soft. There is no hepatomegaly, splenomegaly or mass.     Tenderness: There is no abdominal tenderness.     Hernia: No hernia is  present.  Musculoskeletal:        General: No tenderness.     Cervical back: Neck supple.     Right lower leg: No edema.     Left lower leg: No edema.  Skin:    Findings: No rash.  Neurological:     Mental Status: She is alert and oriented to person, place, and time.     Motor: No weakness.  Psychiatric:        Mood and Affect: Mood and affect normal.        Behavior: Behavior normal.    BP (!) 146/95   Pulse 81   Ht '5\' 2"'$  (1.575 m)  Wt 193 lb 9.6 oz (87.8 kg)   LMP 09/22/2017   BMI 35.41 kg/m  Wt Readings from Last 3 Encounters:  07/27/21 193 lb 9.6 oz (87.8 kg)  07/14/21 187 lb (84.8 kg)  03/31/21 190 lb 11.2 oz (86.5 kg)     Health Maintenance Due  Topic Date Due   COVID-19 Vaccine (1) Never done   Pneumococcal Vaccine 55-88 Years old (1 - PCV) Never done   FOOT EXAM  Never done   Hepatitis C Screening  Never done   TETANUS/TDAP  Never done   Zoster Vaccines- Shingrix (1 of 2) Never done   MAMMOGRAM  Never done   INFLUENZA VACCINE  06/27/2021    There are no preventive care reminders to display for this patient.  Lab Results  Component Value Date   TSH 9.64 (H) 12/23/2020   Lab Results  Component Value Date   WBC 7.1 12/23/2020   HGB 15.9 (H) 12/23/2020   HCT 46.7 (H) 12/23/2020   MCV 88.3 12/23/2020   PLT 307 12/23/2020   Lab Results  Component Value Date   NA 139 12/23/2020   K 4.6 12/23/2020   CO2 20 12/23/2020   GLUCOSE 236 (H) 12/23/2020   BUN 15 12/23/2020   CREATININE 0.60 12/23/2020   BILITOT 0.8 12/23/2020   ALKPHOS 107 10/31/2018   AST 19 12/23/2020   ALT 20 12/23/2020   PROT 7.4 12/23/2020   ALBUMIN 4.2 10/31/2018   CALCIUM 10.3 12/23/2020   ANIONGAP 10 05/16/2019   Lab Results  Component Value Date   CHOL 122 10/08/2015   Lab Results  Component Value Date   HDL 31 (L) 10/08/2015   Lab Results  Component Value Date   LDLCALC 64 10/08/2015   Lab Results  Component Value Date   TRIG 136 10/08/2015   Lab Results   Component Value Date   CHOLHDL 3.9 10/08/2015   Lab Results  Component Value Date   HGBA1C 6.0 07/27/2021      Assessment & Plan:   Problem List Items Addressed This Visit       Cardiovascular and Mediastinum   Essential hypertension     Patient denies any chest pain or shortness of breath there is no history of palpitation or paroxysmal nocturnal dyspnea   patient was advised to follow low-salt low-cholesterol diet    ideally I want to keep systolic blood pressure below 130 mmHg, patient was asked to check blood pressure one times a week and give me a report on that.  Patient will be follow-up in 3 months  or earlier as needed, patient will call me back for any change in the cardiovascular symptoms Patient was advised to buy a book from local bookstore concerning blood pressure and read several chapters  every day.  This will be supplemented by some of the material we will give him from the office.  Patient should also utilize other resources like YouTube and Internet to learn more about the blood pressure and the diet.        Endocrine   Type 2 diabetes mellitus with hyperglycemia, without long-term current use of insulin ([place])    - The patient's blood sugar is labile on med. - The patient will continue the current treatment regimen.  - I encouraged the patient to regularly check blood sugar.  - I encouraged the patient to monitor diet. I encouraged the patient to eat low-carb and low-sugar to help prevent blood sugar spikes.  - I encouraged the  patient to continue following their prescribed treatment plan for diabetes - I informed the patient to get help if blood sugar drops below '54mg'$ /dL, or if suddenly have trouble thinking clearly or breathing.  Patient was advised to buy a book on diabetes from a local bookstore or from [place]  Patient should read 2 chapters every day to keep the motivation going, this is in addition to some of the materials we provided them from the office.   There are other resources on the Internet like YouTube and [employer] to get an education on the diabetes        Musculoskeletal and Integument   Lumbar facet arthropathy    - Patient's back pain is under control with medication.  - Encouraged the patient to stretch or do yoga as able to help with back pain        Other   Obesity (BMI 35.0-39.9 without comorbidity)    - I encouraged the patient to lose weight.  - I educated them on making healthy dietary choices including eating more fruits and vegetables and less fried foods. - I encouraged the patient to exercise more, and educated on the benefits of exercise including weight loss, diabetes prevention, and hypertension prevention.   Dietary counseling with a registered dietician  Referral to a weight management support group (e.g. Weight Watchers, Overeaters Anonymous)  If your BMI is greater than 29 or you have gained more than 15 pounds you should work on weight loss.  Attend a healthy cooking class       Chronic bilateral low back pain without sciatica    Refer to the pain specialist patient was started on Lyrica      Relevant Medications   pregabalin (LYRICA) 50 MG capsule   Other Visit Diagnoses     Type 2 diabetes mellitus without complication, without long-term current use of insulin ([place])    -  Primary   Relevant Orders   POCT HgB A1C (Completed)       Meds ordered this encounter  Medications   pregabalin (LYRICA) 50 MG capsule    Sig: Take 1 capsule (50 mg total) by mouth 3 (three) times daily.    Dispense:  90 capsule    Refill:  1    Follow-up: No follow-ups on file.    Cletis Athens, MD

## 2021-08-08 ENCOUNTER — Other Ambulatory Visit: Payer: Self-pay | Admitting: Internal Medicine

## 2021-08-12 ENCOUNTER — Other Ambulatory Visit: Payer: Self-pay | Admitting: Internal Medicine

## 2021-09-01 ENCOUNTER — Other Ambulatory Visit: Payer: Self-pay | Admitting: Internal Medicine

## 2021-09-07 ENCOUNTER — Other Ambulatory Visit: Payer: Self-pay | Admitting: Student in an Organized Health Care Education/Training Program

## 2021-09-07 DIAGNOSIS — M47816 Spondylosis without myelopathy or radiculopathy, lumbar region: Secondary | ICD-10-CM

## 2021-09-07 DIAGNOSIS — M5136 Other intervertebral disc degeneration, lumbar region: Secondary | ICD-10-CM

## 2021-09-07 DIAGNOSIS — G894 Chronic pain syndrome: Secondary | ICD-10-CM

## 2021-09-12 ENCOUNTER — Other Ambulatory Visit: Payer: Self-pay

## 2021-09-12 ENCOUNTER — Ambulatory Visit (INDEPENDENT_AMBULATORY_CARE_PROVIDER_SITE_OTHER): Payer: Medicare Other | Admitting: *Deleted

## 2021-09-12 DIAGNOSIS — Z23 Encounter for immunization: Secondary | ICD-10-CM

## 2021-09-15 ENCOUNTER — Other Ambulatory Visit: Payer: Self-pay | Admitting: Internal Medicine

## 2021-10-03 ENCOUNTER — Other Ambulatory Visit: Payer: Self-pay | Admitting: *Deleted

## 2021-10-03 MED ORDER — IBUPROFEN 800 MG PO TABS: 800.0000 mg | ORAL_TABLET | Freq: Three times a day (TID) | ORAL | 1 refills | Status: DC | PRN

## 2021-10-07 ENCOUNTER — Other Ambulatory Visit: Payer: Self-pay | Admitting: Internal Medicine

## 2021-11-08 ENCOUNTER — Ambulatory Visit (INDEPENDENT_AMBULATORY_CARE_PROVIDER_SITE_OTHER): Payer: Medicare Other

## 2021-11-08 DIAGNOSIS — Z0000 Encounter for general adult medical examination without abnormal findings: Secondary | ICD-10-CM

## 2021-11-08 MED ORDER — CYCLOBENZAPRINE HCL 10 MG PO TABS: 10.0000 mg | ORAL_TABLET | Freq: Three times a day (TID) | ORAL | 0 refills | Status: AC | PRN

## 2021-11-08 NOTE — Progress Notes (Signed)
Subjective:   Kristin Wilson is a 60 y.o. female who presents for Medicare Annual (Subsequent) preventive examination. I discussed the limitations of evaluation and management by telemedicine and the availability of in person appointments. The patient expressed understanding and agreed to proceed.   Visit performed by audio   Patient location: Home  Provider location: Home  Review of Systems    N/A       Objective:    Today's Vitals   11/08/21 1405  PainSc: 6    There is no height or weight on file to calculate BMI.  Advanced Directives 11/08/2021 12/19/2019 01/29/2019 10/31/2018 09/24/2018 09/19/2018 09/04/2018  Does Patient Have a Medical Advance Directive? No No No No No No No  Would patient like information on creating a medical advance directive? - No - Patient declined - No - Patient declined No - Patient declined - No - Patient declined    Current Medications (verified) Outpatient Encounter Medications as of 11/08/2021  Medication Sig   Accu-Chek FastClix Lancets MISC USE TO CHECK BLOOD SUGAR DAILY   ACCU-CHEK GUIDE test strip USE TO CHECK BLOOD SUGAR DAILY   acetaminophen (TYLENOL) 500 MG tablet Take by mouth every 6 (six) hours as needed.   aspirin EC 81 MG tablet Take 81 mg by mouth daily.   glipiZIDE (GLUCOTROL XL) 10 MG 24 hr tablet TAKE 1 TABLET BY MOUTH EVERY DAY   ibuprofen (ADVIL) 800 MG tablet Take 1 tablet (800 mg total) by mouth every 8 (eight) hours as needed.   losartan (COZAAR) 25 MG tablet Take 1 tablet (25 mg total) by mouth daily.   metFORMIN (GLUCOPHAGE) 500 MG tablet TAKE 2 TABLETS BY MOUTH TWICE A DAY   methimazole (TAPAZOLE) 10 MG tablet TAKE 1 TABLET BY MOUTH TWO TIMES A DAY.   OZEMPIC, 0.25 OR 0.5 MG/DOSE, 2 MG/1.5ML SOPN INJECT 0.5 MG INTO THE SKIN ONCE A WEEK.   [DISCONTINUED] cyclobenzaprine (FLEXERIL) 10 MG tablet Take 1 tablet (10 mg total) by mouth 3 (three) times daily as needed for muscle spasms.   cyclobenzaprine (FLEXERIL) 10 MG  tablet Take 1 tablet (10 mg total) by mouth 3 (three) times daily as needed for muscle spasms.   [DISCONTINUED] estradiol (VIVELLE-DOT) 0.075 MG/24HR Place 1 patch onto the skin 2 (two) times a week. (Patient not taking: Reported on 11/08/2021)   [DISCONTINUED] pregabalin (LYRICA) 50 MG capsule Take 1 capsule (50 mg total) by mouth 3 (three) times daily. (Patient not taking: Reported on 11/08/2021)   No facility-administered encounter medications on file as of 11/08/2021.    Allergies (verified) Tape   History: Past Medical History:  Diagnosis Date   Bronchitis    Diabetes mellitus without complication ([place])    Hypertension    Hyperthyroidism    Osteoarthritis    Pyelonephritis    Sciatica    Urinary tract infection    Past Surgical History:  Procedure Laterality Date   ANTERIOR AND POSTERIOR REPAIR WITH SACROSPINOUS FIXATION N/A 09/24/2018   Procedure: POSTERIOR REPAIR;  Surgeon: Gae Dry, MD;  Location: [facility] ORS;  Service: Gynecology;  Laterality: N/A;   cholecystecomy     CHOLECYSTECTOMY     COLONOSCOPY WITH PROPOFOL N/A 12/19/2019   Procedure: COLONOSCOPY WITH PROPOFOL;  Surgeon: Lin Landsman, MD;  Location: [facility] ENDOSCOPY;  Service: Gastroenterology;  Laterality: N/A;   CYSTOSCOPY N/A 09/24/2018   Procedure: CYSTOSCOPY;  Surgeon: Gae Dry, MD;  Location: [facility] ORS;  Service: Gynecology;  Laterality: N/A;   LAPAROSCOPIC  HYSTERECTOMY Bilateral 09/24/2018   Procedure: HYSTERECTOMY TOTAL LAPAROSCOPIC- BSO;  Surgeon: Gae Dry, MD;  Location: [facility] ORS;  Service: Gynecology;  Laterality: Bilateral;   TUBAL LIGATION     Family History  Problem Relation Age of Onset   Coronary artery disease Mother 45   Diabetic kidney disease Paternal Grandmother    CAD Paternal Grandmother 6   Social History   Socioeconomic History   Marital status: Divorced    Spouse name: Not on file   Number of children: Not on file   Years of education: Not on file    Highest education level: Some college, no degree  Occupational History   Occupation: home maker  Tobacco Use   Smoking status: Never   Smokeless tobacco: Never  Vaping Use   Vaping Use: Never used  Substance and Sexual Activity   Alcohol use: No   Drug use: No   Sexual activity: Not Currently    Birth control/protection: None  Other Topics Concern   Not on file  Social History Narrative   Not on file   Social Determinants of Health   Financial Resource Strain: Medium Risk   Difficulty of Paying Living Expenses: Somewhat hard  Food Insecurity: No Food Insecurity   Worried About Charity fundraiser in the Last Year: Never true   Ran Out of Food in the Last Year: Never true  Transportation Needs: No Transportation Needs   Lack of Transportation (Medical): No   Lack of Transportation (Non-Medical): No  Physical Activity: Inactive   Days of Exercise per Week: 0 days   Minutes of Exercise per Session: 0 min  Stress: No Stress Concern Present   Feeling of Stress : Not at all  Social Connections: Moderately Integrated   Frequency of Communication with Friends and Family: More than three times a week   Frequency of Social Gatherings with Friends and Family: Twice a week   Attends Religious Services: More than 4 times per year   Active Member of [employer] or Organizations: No   Attends Music therapist: More than 4 times per year   Marital Status: Divorced    Tobacco Counseling Counseling given: Not Answered   Clinical Intake:  Pre-visit preparation completed: Yes  Pain : 0-10 Pain Score: 6  Pain Type: Chronic pain Pain Location: Back Pain Onset: More than a month ago Pain Frequency: Intermittent Pain Relieving Factors: Laying down, ibu  Pain Relieving Factors: Laying down, ibu  Diabetes: Yes  How often do you need to have someone help you when you read instructions, pamphlets, or other written materials from your doctor or pharmacy?: 1 - Never What is  the last grade level you completed in school?: Some College  Diabetic?Yes  Interpreter Needed?: No  Information entered by :: Anson [place] CMA   Activities of Daily Living In your present state of health, do you have any difficulty performing the following activities: 11/08/2021  Hearing? N  Vision? N  Difficulty concentrating or making decisions? N  Walking or climbing stairs? N  Dressing or bathing? N  Doing errands, shopping? N  Preparing Food and eating ? N  Using the Toilet? N  In the past six months, have you accidently leaked urine? N  Do you have problems with loss of bowel control? N  Managing your Medications? N  Managing your Finances? N  Housekeeping or managing your Housekeeping? N  Some recent data might be hidden    Patient Care Team: Cletis Athens, MD  as PCP - General (Internal Medicine)  Indicate any recent Medical Services you may have received from other than [facility] providers in the past year (date may be approximate).     Assessment:   This is a routine wellness examination for Tucker.  Hearing/Vision screen No results found.  Dietary issues and exercise activities discussed:     Goals Addressed   None    Depression Screen PHQ 2/9 Scores 11/08/2021 07/27/2021 07/17/2019  PHQ - 2 Score 0 0 0    Fall Risk Fall Risk  11/08/2021 07/27/2021 07/14/2021 07/17/2019  Falls in the past year? 0 0 0 0  Number falls in past yr: 0 0 0 -  Injury with Fall? 0 0 0 -  Risk for fall due to : No Fall Risks No Fall Risks - -  Follow up Falls evaluation completed Falls evaluation completed - -    FALL RISK PREVENTION PERTAINING TO THE HOME:  Any stairs in or around the home? No  If so, are there any without handrails? No  Home free of loose throw rugs in walkways, pet beds, electrical cords, etc? Yes  Adequate lighting in your home to reduce risk of falls? Yes   ASSISTIVE DEVICES UTILIZED TO PREVENT FALLS:  Life alert? No  Use of a cane, walker or w/c?  No  Grab bars in the bathroom? No  Shower chair or bench in shower? No  Elevated toilet seat or a handicapped toilet? No   TIMED UP AND GO:  Was the test performed? No .  Length of time to ambulate 10 feet: 0 sec.     Cognitive Function:     6CIT Screen 11/08/2021  What Year? 0 points  What month? 0 points  What time? 0 points  Count back from 20 0 points  Months in reverse 0 points  Repeat phrase 0 points  Total Score 0    Immunizations Immunization History  Administered Date(s) Administered   Influenza,inj,Quad PF,6+ Mos 10/08/2015, 09/09/2018, 08/27/2019, 10/15/2020, 09/12/2021    TDAP status: Due, Education has been provided regarding the importance of this vaccine. Advised may receive this vaccine at local pharmacy or Health Dept. Aware to provide a copy of the vaccination record if obtained from local pharmacy or Health Dept. Verbalized acceptance and understanding.  Flu Vaccine status: Up to date   Covid-19 vaccine status: Declined, Education has been provided regarding the importance of this vaccine but patient still declined. Advised may receive this vaccine at local pharmacy or Health Dept.or vaccine clinic. Aware to provide a copy of the vaccination record if obtained from local pharmacy or Health Dept. Verbalized acceptance and understanding.  Qualifies for Shingles Vaccine? No   Zostavax completed No   Shingrix Completed?: No.    Education has been provided regarding the importance of this vaccine. Patient has been advised to call insurance company to determine out of pocket expense if they have not yet received this vaccine. Advised may also receive vaccine at local pharmacy or Health Dept. Verbalized acceptance and understanding.  Screening Tests Health Maintenance  Topic Date Due   COVID-19 Vaccine (1) Never done   Pneumococcal Vaccine 47-29 Years old (1 - PCV) Never done   FOOT EXAM  Never done   Hepatitis C Screening  Never done   TETANUS/TDAP  Never  done   Zoster Vaccines- Shingrix (1 of 2) Never done   MAMMOGRAM  Never done   HEMOGLOBIN A1C  01/24/2022   OPHTHALMOLOGY EXAM  03/31/2022  COLONOSCOPY (Pts 45-53yrs Insurance coverage will need to be confirmed)  12/18/2029   INFLUENZA VACCINE  Completed   HIV Screening  Completed   HPV VACCINES  Aged Out   PAP SMEAR-Modifier  Discontinued    Health Maintenance  Health Maintenance Due  Topic Date Due   COVID-19 Vaccine (1) Never done   Pneumococcal Vaccine 5-95 Years old (1 - PCV) Never done   FOOT EXAM  Never done   Hepatitis C Screening  Never done   TETANUS/TDAP  Never done   Zoster Vaccines- Shingrix (1 of 2) Never done   MAMMOGRAM  Never done    Colorectal cancer screening: Type of screening: Colonoscopy. Completed 11/2019. Repeat every 10 years  Mammogram status: Ordered 03/2021. Pt provided with contact info and advised to call to schedule appt.    Lung Cancer Screening: (Low Dose CT Chest recommended if Age 63-80 years, 30 pack-year currently smoking OR have quit w/in 15years.) does not qualify.   Lung Cancer Screening Referral: No  Additional Screening:  Hepatitis C Screening: does not qualify; Completed No  Vision Screening: Recommended annual ophthalmology exams for early detection of glaucoma and other disorders of the eye. Is the patient up to date with their annual eye exam?  No  Who is the provider or what is the name of the office in which the patient attends annual eye exams? [facility] If pt is not established with a provider, would they like to be referred to a provider to establish care? No .   Dental Screening: Recommended annual dental exams for proper oral hygiene  Community Resource Referral / Chronic Care Management: CRR required this visit?  No   CCM required this visit?  No      Plan:     I have personally reviewed and noted the following in the patients chart:   Medical and social history Use of alcohol, tobacco or  illicit drugs  Current medications and supplements including opioid prescriptions.  Functional ability and status Nutritional status Physical activity Advanced directives List of other physicians Hospitalizations, surgeries, and ER visits in previous 12 months Vitals Screenings to include cognitive, depression, and falls Referrals and appointments  In addition, I have reviewed and discussed with patient certain preventive protocols, quality metrics, and best practice recommendations. A written personalized care plan for preventive services as well as general preventive health recommendations were provided to patient.    Ms. Aber , Thank you for taking time to come for your Medicare Wellness Visit. I appreciate your ongoing commitment to your health goals. Please review the following plan we discussed and let me know if I can assist you in the future.   These are the goals we discussed:  Goals   None     This is a list of the screening recommended for you and due dates:  Health Maintenance  Topic Date Due   COVID-19 Vaccine (1) Never done   Pneumococcal Vaccination (1 - PCV) Never done   Complete foot exam   Never done   Hepatitis C Screening: USPSTF Recommendation to screen - Ages 51-79 yo.  Never done   Tetanus Vaccine  Never done   Zoster (Shingles) Vaccine (1 of 2) Never done   Mammogram  Never done   Hemoglobin A1C  01/24/2022   Eye exam for diabetics  03/31/2022   Colon Cancer Screening  12/18/2029   Flu Shot  Completed   HIV Screening  Completed   HPV Vaccine  Aged Out  Pap Smear  Discontinued     Renato Gails, [facility]   11/08/2021

## 2021-11-09 NOTE — Progress Notes (Signed)
I have reviewed this visit and agree with the documentation.   

## 2021-11-29 ENCOUNTER — Other Ambulatory Visit: Payer: Self-pay | Admitting: Internal Medicine

## 2021-12-10 ENCOUNTER — Other Ambulatory Visit: Payer: Self-pay | Admitting: Internal Medicine

## 2021-12-13 ENCOUNTER — Other Ambulatory Visit: Payer: Self-pay | Admitting: *Deleted

## 2021-12-13 MED ORDER — ACCU-CHEK GUIDE VI STRP: 1.0000 | ORAL_STRIP | Freq: Every day | 3 refills | Status: AC

## 2021-12-19 ENCOUNTER — Other Ambulatory Visit: Payer: Self-pay | Admitting: *Deleted

## 2021-12-19 MED ORDER — AZITHROMYCIN 250 MG PO TABS: ORAL_TABLET | ORAL | 0 refills | Status: AC

## 2021-12-26 ENCOUNTER — Ambulatory Visit: Payer: Medicare Other | Admitting: Internal Medicine

## 2022-01-21 ENCOUNTER — Other Ambulatory Visit: Payer: Self-pay | Admitting: Internal Medicine

## 2022-02-08 DIAGNOSIS — H43813 Vitreous degeneration, bilateral: Secondary | ICD-10-CM | POA: Diagnosis not present

## 2022-02-08 DIAGNOSIS — H04123 Dry eye syndrome of bilateral lacrimal glands: Secondary | ICD-10-CM | POA: Diagnosis not present

## 2022-02-08 DIAGNOSIS — H524 Presbyopia: Secondary | ICD-10-CM | POA: Diagnosis not present

## 2022-02-08 DIAGNOSIS — H25011 Cortical age-related cataract, right eye: Secondary | ICD-10-CM | POA: Diagnosis not present

## 2022-02-08 DIAGNOSIS — H5213 Myopia, bilateral: Secondary | ICD-10-CM | POA: Diagnosis not present

## 2022-02-08 DIAGNOSIS — E119 Type 2 diabetes mellitus without complications: Secondary | ICD-10-CM | POA: Diagnosis not present

## 2022-02-08 LAB — HM DIABETES EYE EXAM

## 2022-02-15 ENCOUNTER — Other Ambulatory Visit: Payer: Self-pay | Admitting: Internal Medicine

## 2022-02-20 ENCOUNTER — Other Ambulatory Visit: Payer: Self-pay | Admitting: *Deleted

## 2022-02-20 MED ORDER — CIPROFLOXACIN HCL 0.3 % OP SOLN: 1.0000 [drp] | OPHTHALMIC | 0 refills | Status: AC

## 2022-02-28 ENCOUNTER — Encounter: Payer: Self-pay | Admitting: *Deleted

## 2022-03-10 ENCOUNTER — Other Ambulatory Visit: Payer: Self-pay

## 2022-03-10 ENCOUNTER — Other Ambulatory Visit: Payer: Self-pay | Admitting: Internal Medicine

## 2022-03-10 MED ORDER — METFORMIN HCL 500 MG PO TABS: 1000.0000 mg | ORAL_TABLET | Freq: Two times a day (BID) | ORAL | 2 refills | Status: DC

## 2022-03-15 ENCOUNTER — Ambulatory Visit: Payer: Medicare Other

## 2022-03-29 ENCOUNTER — Ambulatory Visit: Payer: Medicare Other | Admitting: *Deleted

## 2022-03-29 DIAGNOSIS — E1165 Type 2 diabetes mellitus with hyperglycemia: Secondary | ICD-10-CM | POA: Diagnosis not present

## 2022-03-29 DIAGNOSIS — E669 Obesity, unspecified: Secondary | ICD-10-CM

## 2022-03-29 DIAGNOSIS — R739 Hyperglycemia, unspecified: Secondary | ICD-10-CM

## 2022-03-29 DIAGNOSIS — I10 Essential (primary) hypertension: Secondary | ICD-10-CM | POA: Diagnosis not present

## 2022-03-30 LAB — COMPLETE METABOLIC PANEL WITH GFR
AG Ratio: 1.6 (calc) (ref 1.0–2.5)
ALT: 22 U/L (ref 6–29)
AST: 22 U/L (ref 10–35)
Albumin: 4.4 g/dL (ref 3.6–5.1)
Alkaline phosphatase (APISO): 66 U/L (ref 37–153)
BUN: 19 mg/dL (ref 7–25)
CO2: 23 mmol/L (ref 20–32)
Calcium: 9.5 mg/dL (ref 8.6–10.4)
Chloride: 104 mmol/L (ref 98–110)
Creat: 0.6 mg/dL (ref 0.50–1.03)
Globulin: 2.7 g/dL (calc) (ref 1.9–3.7)
Glucose, Bld: 157 mg/dL — ABNORMAL HIGH (ref 65–99)
Potassium: 4.5 mmol/L (ref 3.5–5.3)
Sodium: 142 mmol/L (ref 135–146)
Total Bilirubin: 1.1 mg/dL (ref 0.2–1.2)
Total Protein: 7.1 g/dL (ref 6.1–8.1)
eGFR: 104 mL/min/{1.73_m2} (ref >=60)

## 2022-03-30 LAB — CBC WITH DIFFERENTIAL/PLATELET
Absolute Monocytes: 558 cells/uL (ref 200–950)
Basophils Absolute: 87 cells/uL (ref 0–200)
Basophils Relative: 1.4 %
Eosinophils Absolute: 87 cells/uL (ref 15–500)
Eosinophils Relative: 1.4 %
HCT: 44 % (ref 35.0–45.0)
Hemoglobin: 14.9 g/dL (ref 11.7–15.5)
Lymphs Abs: 2189 cells/uL (ref 850–3900)
MCH: 29.1 pg (ref 27.0–33.0)
MCHC: 33.9 g/dL (ref 32.0–36.0)
MCV: 85.9 fL (ref 80.0–100.0)
MPV: 10.6 fL (ref 7.5–12.5)
Monocytes Relative: 9 %
Neutro Abs: 3280 cells/uL (ref 1500–7800)
Neutrophils Relative %: 52.9 %
Platelets: 281 10*3/uL (ref 140–400)
RBC: 5.12 10*6/uL — ABNORMAL HIGH (ref 3.80–5.10)
RDW: 13 % (ref 11.0–15.0)
Total Lymphocyte: 35.3 %
WBC: 6.2 10*3/uL (ref 3.8–10.8)

## 2022-03-30 LAB — LIPID PANEL
Cholesterol: 216 mg/dL — ABNORMAL HIGH (ref <200)
HDL: 41 mg/dL — ABNORMAL LOW (ref >=50)
LDL Cholesterol (Calc): 133 mg/dL (calc) — ABNORMAL HIGH
Non-HDL Cholesterol (Calc): 175 mg/dL (calc) — ABNORMAL HIGH (ref <130)
Total CHOL/HDL Ratio: 5.3 (calc) — ABNORMAL HIGH (ref <5.0)
Triglycerides: 275 mg/dL — ABNORMAL HIGH (ref <150)

## 2022-03-30 LAB — HEMOGLOBIN A1C
Hgb A1c MFr Bld: 6.9 % of total Hgb — ABNORMAL HIGH (ref <5.7)
Mean Plasma Glucose: 151 mg/dL
eAG (mmol/L): 8.4 mmol/L

## 2022-03-30 LAB — TSH: TSH: 4.44 mIU/L (ref 0.40–4.50)

## 2022-05-23 ENCOUNTER — Other Ambulatory Visit: Payer: Self-pay | Admitting: *Deleted

## 2022-05-23 MED ORDER — OZEMPIC (0.25 OR 0.5 MG/DOSE) 2 MG/1.5ML ~~LOC~~ SOPN: 0.5000 mg | PEN_INJECTOR | SUBCUTANEOUS | 2 refills | Status: DC

## 2022-05-26 ENCOUNTER — Ambulatory Visit (INDEPENDENT_AMBULATORY_CARE_PROVIDER_SITE_OTHER): Payer: Medicare Other | Admitting: Nurse Practitioner

## 2022-05-26 ENCOUNTER — Encounter: Payer: Self-pay | Admitting: Nurse Practitioner

## 2022-05-26 VITALS — BP 140/88 | HR 84 | Ht 62.0 in | Wt 185.6 lb

## 2022-05-26 DIAGNOSIS — I10 Essential (primary) hypertension: Secondary | ICD-10-CM

## 2022-05-26 DIAGNOSIS — J329 Chronic sinusitis, unspecified: Secondary | ICD-10-CM | POA: Insufficient documentation

## 2022-05-26 DIAGNOSIS — E669 Obesity, unspecified: Secondary | ICD-10-CM

## 2022-05-26 DIAGNOSIS — E1165 Type 2 diabetes mellitus with hyperglycemia: Secondary | ICD-10-CM

## 2022-05-26 LAB — GLUCOSE, POCT (MANUAL RESULT ENTRY): POC Glucose: 186 mg/dl — AB (ref 70–99)

## 2022-05-26 LAB — POCT GLYCOSYLATED HEMOGLOBIN (HGB A1C): HbA1c POC (<> result, manual entry): 5.7 % (ref 4.0–5.6)

## 2022-05-26 MED ORDER — AMOXICILLIN-POT CLAVULANATE 875-125 MG PO TABS: 1.0000 | ORAL_TABLET | Freq: Two times a day (BID) | ORAL | 0 refills | Status: AC

## 2022-05-26 MED ORDER — PREDNISONE 20 MG PO TABS: 20.0000 mg | ORAL_TABLET | Freq: Two times a day (BID) | ORAL | 0 refills | Status: DC

## 2022-05-26 NOTE — Assessment & Plan Note (Signed)
Patient BP 140/88 in the office today Advised pt to follow a low sodium and heart healthy diet. Continue losartan 25 mg once a day

## 2022-05-26 NOTE — Assessment & Plan Note (Signed)
Patient BS 186 and hemoglobin A1c 5.7 in the office today. Advised pt to check the BS regularly, make a log and bring to next appointment.  Advised pt to monitor diet. Advised pt to eat variety of food including fruits, vegetables, whole grains, complex carbohydrates and proteins.

## 2022-05-26 NOTE — Assessment & Plan Note (Signed)
Body mass index is 33.95 kg/m. Advised pt to lose weight. Advised patient to avoid trans fat, fatty and fried food. Follow a regular physical activity schedule. Went over the risk of chronic diseases with increased weight.

## 2022-05-26 NOTE — Progress Notes (Signed)
Established Patient Office Visit  SUBJECTIVE:  Subjective  Patient ID: Kristin Wilson, female    DOB: 07-01-1961  Age: 61 y.o. MRN: [id number]  CC:  Chief Complaint  Patient presents with   Hyperglycemia    Patient here today for a1c check   Sinusitis    Patient has sinus infection     HPI Kristin Wilson presents for diabetes follow-up.  She also complains of sinus pressure, congestion and headache going on from more than 2 weeks. Sinusitis This is a new problem. The current episode started 1 to 4 weeks ago. The problem is unchanged. There has been no fever. Associated symptoms include congestion, headaches and sinus pressure. Past treatments include oral decongestants. The treatment provided mild relief.     Past Medical History:  Diagnosis Date   Bronchitis    Diabetes mellitus without complication ([place])    Hypertension    Hyperthyroidism    Osteoarthritis    Pyelonephritis    Sciatica    Urinary tract infection     Past Surgical History:  Procedure Laterality Date   ANTERIOR AND POSTERIOR REPAIR WITH SACROSPINOUS FIXATION N/A 09/24/2018   Procedure: POSTERIOR REPAIR;  Surgeon: Gae Dry, MD;  Location: [facility] ORS;  Service: Gynecology;  Laterality: N/A;   cholecystecomy     CHOLECYSTECTOMY     COLONOSCOPY WITH PROPOFOL N/A 12/19/2019   Procedure: COLONOSCOPY WITH PROPOFOL;  Surgeon: Lin Landsman, MD;  Location: [facility] ENDOSCOPY;  Service: Gastroenterology;  Laterality: N/A;   CYSTOSCOPY N/A 09/24/2018   Procedure: CYSTOSCOPY;  Surgeon: Gae Dry, MD;  Location: [facility] ORS;  Service: Gynecology;  Laterality: N/A;   LAPAROSCOPIC HYSTERECTOMY Bilateral 09/24/2018   Procedure: HYSTERECTOMY TOTAL LAPAROSCOPIC- BSO;  Surgeon: Gae Dry, MD;  Location: [facility] ORS;  Service: Gynecology;  Laterality: Bilateral;   TUBAL LIGATION      Family History  Problem Relation Age of Onset   Coronary artery disease Mother 28   Diabetic kidney disease  Paternal Grandmother    CAD Paternal Grandmother 87    Social History   Socioeconomic History   Marital status: Divorced    Spouse name: Not on file   Number of children: Not on file   Years of education: Not on file   Highest education level: Some college, no degree  Occupational History   Occupation: home maker  Tobacco Use   Smoking status: Never   Smokeless tobacco: Never  Vaping Use   Vaping Use: Never used  Substance and Sexual Activity   Alcohol use: No   Drug use: No   Sexual activity: Not Currently    Birth control/protection: None  Other Topics Concern   Not on file  Social History Narrative   Not on file   Social Determinants of Health   Financial Resource Strain: Medium Risk (11/08/2021)   Overall Financial Resource Strain (CARDIA)    Difficulty of Paying Living Expenses: Somewhat hard  Food Insecurity: No Food Insecurity (11/08/2021)   Hunger Vital Sign    Worried About Running Out of Food in the Last Year: Never true    Ran Out of Food in the Last Year: Never true  Transportation Needs: No Transportation Needs (11/08/2021)   PRAPARE - Hydrologist (Medical): No    Lack of Transportation (Non-Medical): No  Physical Activity: Inactive (11/08/2021)   Exercise Vital Sign    Days of Exercise per Week: 0 days    Minutes of Exercise per Session:  0 min  Stress: No Stress Concern Present (11/08/2021)   [place]    Feeling of Stress : Not at all  Social Connections: Moderately Integrated (11/08/2021)   Social Connection and Isolation Panel [NHANES]    Frequency of Communication with Friends and Family: More than three times a week    Frequency of Social Gatherings with Friends and Family: Twice a week    Attends Religious Services: More than 4 times per year    Active Member of [employer] or Organizations: No    Attends Music therapist: More than 4 times  per year    Marital Status: Divorced  Human resources officer Violence: Not At Risk (11/08/2021)   Humiliation, Afraid, Rape, and Kick questionnaire    Fear of Current or Ex-Partner: No    Emotionally Abused: No    Physically Abused: No    Sexually Abused: No     Current Outpatient Medications:    Accu-Chek FastClix Lancets MISC, USE TO CHECK BLOOD SUGAR DAILY, Disp: 102 each, Rfl: 6   acetaminophen (TYLENOL) 500 MG tablet, Take by mouth every 6 (six) hours as needed., Disp: , Rfl:    amoxicillin-clavulanate (AUGMENTIN) 875-125 MG tablet, Take 1 tablet by mouth 2 (two) times daily., Disp: 20 tablet, Rfl: 0   aspirin EC 81 MG tablet, Take 81 mg by mouth daily., Disp: , Rfl:    ciprofloxacin (CILOXAN) 0.3 % ophthalmic solution, Place 1 drop into both eyes every 2 (two) hours. Administer 1 drop, every 2 hours, while awake, for 2 days. Then 1 drop, every 4 hours, while awake, for the next 5 days., Disp: 5 mL, Rfl: 0   cyclobenzaprine (FLEXERIL) 10 MG tablet, Take 1 tablet (10 mg total) by mouth 3 (three) times daily as needed for muscle spasms., Disp: 30 tablet, Rfl: 0   glipiZIDE (GLUCOTROL XL) 10 MG 24 hr tablet, TAKE 1 TABLET BY MOUTH EVERY DAY, Disp: 90 tablet, Rfl: 2   glucose blood (ACCU-CHEK GUIDE) test strip, 1 each by Other route daily. Use as instructed, Disp: 100 strip, Rfl: 3   ibuprofen (ADVIL) 800 MG tablet, TAKE 1 TABLET BY MOUTH EVERY 8 HOURS AS NEEDED, Disp: 60 tablet, Rfl: 1   losartan (COZAAR) 25 MG tablet, TAKE 1 TABLET (25 MG TOTAL) BY MOUTH DAILY., Disp: 90 tablet, Rfl: 3   metFORMIN (GLUCOPHAGE) 500 MG tablet, Take 2 tablets (1,000 mg total) by mouth 2 (two) times daily., Disp: 360 tablet, Rfl: 2   methimazole (TAPAZOLE) 10 MG tablet, TAKE 1 TABLET BY MOUTH TWICE A DAY, Disp: 180 tablet, Rfl: 1   predniSONE (DELTASONE) 20 MG tablet, Take 1 tablet (20 mg total) by mouth 2 (two) times daily with a meal., Disp: 10 tablet, Rfl: 0   Semaglutide,0.25 or 0.5MG/DOS, (OZEMPIC, 0.25 OR 0.5  MG/DOSE,) 2 MG/1.5ML SOPN, Inject 0.5 mg into the skin once a week., Disp: 3 mL, Rfl: 2   Allergies  Allergen Reactions   Tape Rash    ROS Review of Systems  Constitutional: Negative.   HENT:  Positive for congestion and sinus pressure.   Respiratory: Negative.    Cardiovascular: Negative.   Genitourinary: Negative.   Musculoskeletal: Negative.   Neurological:  Positive for headaches.  Psychiatric/Behavioral: Negative.       OBJECTIVE:    Physical Exam Vitals and nursing note reviewed.  Constitutional:      Appearance: Normal appearance. She is normal weight.  HENT:     Head:  Normocephalic.     Right Ear: Tympanic membrane normal.     Left Ear: Tympanic membrane normal.     Mouth/Throat:     Mouth: Mucous membranes are moist.  Eyes:     Conjunctiva/sclera: Conjunctivae normal.     Pupils: Pupils are equal, round, and reactive to light.  Cardiovascular:     Rate and Rhythm: Normal rate.  Pulmonary:     Effort: Pulmonary effort is normal.     Breath sounds: Normal breath sounds.  Abdominal:     General: Bowel sounds are normal.     Palpations: Abdomen is soft.  Musculoskeletal:        General: No swelling or tenderness.     Cervical back: Normal and normal range of motion.  Skin:    General: Skin is warm.     Capillary Refill: Capillary refill takes less than 2 seconds.     Coloration: Skin is not jaundiced.     Findings: No erythema.  Neurological:     General: No focal deficit present.     Mental Status: She is alert and oriented to person, place, and time. Mental status is at baseline.  Psychiatric:        Mood and Affect: Mood normal.        Behavior: Behavior normal.        Thought Content: Thought content normal.        Judgment: Judgment normal.     BP 140/88   Pulse 84   Ht 5' 2" (1.575 m)   Wt 185 lb 10.1 oz (84.2 kg)   LMP 09/22/2017   BMI 33.95 kg/m  Wt Readings from Last 3 Encounters:  05/26/22 185 lb 10.1 oz (84.2 kg)  07/27/21 193 lb  9.6 oz (87.8 kg)  07/14/21 187 lb (84.8 kg)    Health Maintenance Due  Topic Date Due   COVID-19 Vaccine (1) Never done   FOOT EXAM  Never done   Hepatitis C Screening  Never done   TETANUS/TDAP  Never done   Zoster Vaccines- Shingrix (1 of 2) Never done   MAMMOGRAM  Never done    There are no preventive care reminders to display for this patient.     Latest Ref Rng & Units 03/29/2022   11:47 AM 12/23/2020   12:11 PM 01/29/2019   10:02 PM  CBC  WBC 3.8 - 10.8 Thousand/uL 6.2  7.1  5.7   Hemoglobin 11.7 - 15.5 g/dL 14.9  15.9  16.1   Hematocrit 35.0 - 45.0 % 44.0  46.7  45.7   Platelets 140 - 400 Thousand/uL 281  307  261       Latest Ref Rng & Units 03/29/2022   11:47 AM 12/23/2020   12:11 PM 05/16/2019    4:38 PM  CMP  Glucose 65 - 99 mg/dL 157  236  352   BUN 7 - 25 mg/dL _0 Creatinine 0.50 - 1.03 mg/dL 0.60  0.60  0.57   Sodium 135 - 146 mmol/L 142  139  135   Potassium 3.5 - 5.3 mmol/L 4.5  4.6  3.9   Chloride 98 - 110 mmol/L 104  97  98   CO2 20 - 32 mmol/L _1 Calcium 8.6 - 10.4 mg/dL 9.5  10.3  9.3   Total Protein 6.1 - 8.1 g/dL 7.1  7.4    Total Bilirubin 0.2 - 1.2 mg/dL 1.1  0.8  AST 10 - 35 U/L 22  19    ALT 6 - 29 U/L 22  20      Lab Results  Component Value Date   TSH 4.44 03/29/2022   Lab Results  Component Value Date   ALBUMIN 4.2 10/31/2018   ANIONGAP 10 05/16/2019   EGFR 104 03/29/2022   Lab Results  Component Value Date   CHOL 216 (H) 03/29/2022   CHOL 122 10/08/2015   HDL 41 (L) 03/29/2022   HDL 31 (L) 10/08/2015   LDLCALC 133 (H) 03/29/2022   LDLCALC 64 10/08/2015   CHOLHDL 5.3 (H) 03/29/2022   CHOLHDL 3.9 10/08/2015   Lab Results  Component Value Date   TRIG 275 (H) 03/29/2022   Lab Results  Component Value Date   HGBA1C 5.7 05/26/2022   HGBA1C 6.9 (H) 03/29/2022   HGBA1C 6.0 07/27/2021      ASSESSMENT & PLAN:   Problem List Items Addressed This Visit       Cardiovascular and Mediastinum   Essential  hypertension    Patient BP 140/88 in the office today Advised pt to follow a low sodium and heart healthy diet. Continue losartan 25 mg once a day         Respiratory   Sinusitis    Started patient on Augmentin 500 mg twice a day X 5 days and prednisone 20 mg twice a day X 5 days.      Relevant Medications   amoxicillin-clavulanate (AUGMENTIN) 875-125 MG tablet   predniSONE (DELTASONE) 20 MG tablet     Endocrine   Type 2 diabetes mellitus with hyperglycemia, without long-term current use of insulin ([place]) - Primary    Patient BS 186 and hemoglobin A1c 5.7 in the office today. Advised pt to check the BS regularly, make a log and bring to next appointment.  Advised pt to monitor diet. Advised pt to eat variety of food including fruits, vegetables, whole grains, complex carbohydrates and proteins.        Relevant Orders   POCT glucose (manual entry) (Completed)   POCT HgB A1C (Completed)     Other   Obesity (BMI 30.0-34.9)    Body mass index is 33.95 kg/m. Advised pt to lose weight. Advised patient to avoid trans fat, fatty and fried food. Follow a regular physical activity schedule. Went over the risk of chronic diseases with increased weight.            Meds ordered this encounter  Medications   amoxicillin-clavulanate (AUGMENTIN) 875-125 MG tablet    Sig: Take 1 tablet by mouth 2 (two) times daily.    Dispense:  20 tablet    Refill:  0   predniSONE (DELTASONE) 20 MG tablet    Sig: Take 1 tablet (20 mg total) by mouth 2 (two) times daily with a meal.    Dispense:  10 tablet    Refill:  0    Follow-up: Return in about 3 months (around 08/26/2022), or if symptoms worsen or fail to improve.    Theresia Lo, NP [facility] [address], [place], [place] [address]

## 2022-05-26 NOTE — Assessment & Plan Note (Signed)
Started patient on Augmentin 500 mg twice a day X 5 days and prednisone 20 mg twice a day X 5 days.

## 2022-07-03 ENCOUNTER — Other Ambulatory Visit: Payer: Self-pay | Admitting: *Deleted

## 2022-07-03 MED ORDER — OZEMPIC (0.25 OR 0.5 MG/DOSE) 2 MG/1.5ML ~~LOC~~ SOPN: 0.5000 mg | PEN_INJECTOR | SUBCUTANEOUS | 2 refills | Status: DC

## 2022-07-17 ENCOUNTER — Other Ambulatory Visit: Payer: Self-pay | Admitting: Internal Medicine

## 2022-08-05 ENCOUNTER — Other Ambulatory Visit: Payer: Self-pay | Admitting: *Deleted

## 2022-08-05 DIAGNOSIS — Z1231 Encounter for screening mammogram for malignant neoplasm of breast: Secondary | ICD-10-CM

## 2022-08-17 ENCOUNTER — Other Ambulatory Visit: Payer: Self-pay | Admitting: Nurse Practitioner

## 2022-08-17 ENCOUNTER — Ambulatory Visit (INDEPENDENT_AMBULATORY_CARE_PROVIDER_SITE_OTHER): Payer: Medicare Other | Admitting: *Deleted

## 2022-08-17 DIAGNOSIS — N390 Urinary tract infection, site not specified: Secondary | ICD-10-CM | POA: Diagnosis not present

## 2022-08-17 DIAGNOSIS — Z23 Encounter for immunization: Secondary | ICD-10-CM | POA: Diagnosis not present

## 2022-08-17 LAB — POCT URINALYSIS DIPSTICK
Bilirubin, UA: NEGATIVE
Glucose, UA: NEGATIVE
Ketones, UA: NEGATIVE
Nitrite, UA: NEGATIVE
Protein, UA: NEGATIVE
Spec Grav, UA: 1.015 (ref 1.010–1.025)
Urobilinogen, UA: NEGATIVE E.U./dL — AB
pH, UA: 5.5 (ref 5.0–8.0)

## 2022-08-17 MED ORDER — CIPROFLOXACIN HCL 500 MG PO TABS: 500.0000 mg | ORAL_TABLET | Freq: Two times a day (BID) | ORAL | 0 refills | Status: AC

## 2022-08-17 NOTE — Progress Notes (Signed)
Patient came for nurse visit for urinalysis and flu shot. Prescription for cipro sent to pharmacy.

## 2022-08-18 ENCOUNTER — Other Ambulatory Visit: Payer: Self-pay | Admitting: Internal Medicine

## 2022-09-04 ENCOUNTER — Other Ambulatory Visit: Payer: Self-pay | Admitting: Internal Medicine

## 2022-09-05 DIAGNOSIS — M13811 Other specified arthritis, right shoulder: Secondary | ICD-10-CM | POA: Diagnosis not present

## 2022-09-05 DIAGNOSIS — M7711 Lateral epicondylitis, right elbow: Secondary | ICD-10-CM | POA: Diagnosis not present

## 2022-09-05 DIAGNOSIS — M25521 Pain in right elbow: Secondary | ICD-10-CM | POA: Diagnosis not present

## 2022-09-05 DIAGNOSIS — M25511 Pain in right shoulder: Secondary | ICD-10-CM | POA: Diagnosis not present

## 2022-09-13 DIAGNOSIS — I10 Essential (primary) hypertension: Secondary | ICD-10-CM | POA: Diagnosis not present

## 2022-09-13 DIAGNOSIS — T380X5A Adverse effect of glucocorticoids and synthetic analogues, initial encounter: Secondary | ICD-10-CM | POA: Diagnosis not present

## 2022-09-13 DIAGNOSIS — Z556 Problems related to health literacy: Secondary | ICD-10-CM | POA: Diagnosis not present

## 2022-09-13 DIAGNOSIS — R000 Tachycardia, unspecified: Secondary | ICD-10-CM | POA: Diagnosis not present

## 2022-09-25 ENCOUNTER — Ambulatory Visit: Payer: Medicare Other | Admitting: Internal Medicine

## 2022-09-27 DIAGNOSIS — E119 Type 2 diabetes mellitus without complications: Secondary | ICD-10-CM | POA: Diagnosis not present

## 2022-09-27 DIAGNOSIS — M65332 Trigger finger, left middle finger: Secondary | ICD-10-CM | POA: Diagnosis not present

## 2022-10-06 ENCOUNTER — Other Ambulatory Visit: Payer: Self-pay | Admitting: Nurse Practitioner

## 2022-10-06 MED ORDER — ROSUVASTATIN CALCIUM 5 MG PO TABS: 5.0000 mg | ORAL_TABLET | Freq: Every day | ORAL | 3 refills | Status: DC

## 2022-10-12 ENCOUNTER — Other Ambulatory Visit: Payer: Self-pay | Admitting: Nurse Practitioner

## 2022-10-12 MED ORDER — AZITHROMYCIN 250 MG PO TABS: ORAL_TABLET | ORAL | 0 refills | Status: AC

## 2022-10-12 MED ORDER — AZITHROMYCIN 250 MG PO TABS: ORAL_TABLET | ORAL | 0 refills | Status: DC

## 2022-10-25 ENCOUNTER — Telehealth: Payer: Self-pay | Admitting: Pharmacist

## 2022-10-25 ENCOUNTER — Encounter: Payer: Self-pay | Admitting: Pharmacist

## 2022-10-25 NOTE — Chronic Care Management (AMB) (Signed)
   Outreach Note  10/25/2022 Name: NORRIS BODLEY MRN: [id number] DOB: 1961/09/24  This patient is appearing on the insurance-provided list for being at risk of failing the adherence measure for Statin Use in Persons with Diabetes (SUPD) medications this calendar year.   Per review of Office Visit encounter on 05/26/2022, NP Charanpreet [facility] advised patient to return in about 3 months (around 08/26/2022) for follow-up.   Note Rx for rosuvastatin 5 mg daily sent to [facility] by provider on 10/06/2022. Outreach to [place] and find rosuvastatin Rx was never picked up.  Latest lipid panel results: Lipid Panel     Component Value Date/Time   CHOL 216 (H) 03/29/2022 1147   TRIG 275 (H) 03/29/2022 1147   HDL 41 (L) 03/29/2022 1147   CHOLHDL 5.3 (H) 03/29/2022 1147   VLDL 27 10/08/2015 1250   LDLCALC 133 (H) 03/29/2022 1147    Outreach to patient by telephone today. She reports she is no longer taking rosuvastatin as recalls having had a side effect from this medication. States that she is unable to recall what side effect for sure, but thinks that it may have been diarrhea. Denies interest in restarting rosuvastatin at this time.  Counsel patient on benefit of LDL lowering and reasoning for use of Statin Use in Persons with Diabetes.   Patient states she would like assistance with getting in touch with office to schedule a follow up visit and lab work   Follow Up Plan: Will collaborate with provider and Mikeal [facility] office for assistance to patient with scheduling follow up visit and lab work  Wallace Cullens, PharmD, [place] [phone]

## 2022-10-25 NOTE — Telephone Encounter (Signed)
This encounter was created in error - please disregard.

## 2022-11-04 ENCOUNTER — Other Ambulatory Visit: Payer: Self-pay | Admitting: Internal Medicine

## 2022-11-21 NOTE — Progress Notes (Signed)
[facility] Quality Team Note  Name: Kristin Wilson Date of Birth: 08/31/1961 MRN: [id number] Date: 11/21/2022  [facility] Quality Team has reviewed this patient's chart, please see recommendations below:  [facility] Quality Other; Pt has open quality gap for KED, needs Micro/Creat Urine test to close this. Please address at next visit.

## 2022-11-28 ENCOUNTER — Other Ambulatory Visit: Payer: Self-pay | Admitting: Internal Medicine

## 2022-12-05 ENCOUNTER — Ambulatory Visit (INDEPENDENT_AMBULATORY_CARE_PROVIDER_SITE_OTHER): Payer: Medicare Other | Admitting: Internal Medicine

## 2022-12-05 ENCOUNTER — Encounter: Payer: Self-pay | Admitting: Internal Medicine

## 2022-12-05 VITALS — BP 128/78 | HR 79 | Ht 62.0 in | Wt 182.0 lb

## 2022-12-05 DIAGNOSIS — M5136 Other intervertebral disc degeneration, lumbar region: Secondary | ICD-10-CM | POA: Diagnosis not present

## 2022-12-05 DIAGNOSIS — E1165 Type 2 diabetes mellitus with hyperglycemia: Secondary | ICD-10-CM | POA: Diagnosis not present

## 2022-12-05 DIAGNOSIS — J329 Chronic sinusitis, unspecified: Secondary | ICD-10-CM

## 2022-12-05 DIAGNOSIS — I10 Essential (primary) hypertension: Secondary | ICD-10-CM | POA: Diagnosis not present

## 2022-12-05 DIAGNOSIS — E669 Obesity, unspecified: Secondary | ICD-10-CM

## 2022-12-05 DIAGNOSIS — Z6833 Body mass index (BMI) 33.0-33.9, adult: Secondary | ICD-10-CM

## 2022-12-05 MED ORDER — GLIPIZIDE ER 10 MG PO TB24: 10.0000 mg | ORAL_TABLET | Freq: Every day | ORAL | 1 refills | Status: AC

## 2022-12-05 MED ORDER — METFORMIN HCL 500 MG PO TABS: 1000.0000 mg | ORAL_TABLET | Freq: Two times a day (BID) | ORAL | 2 refills | Status: AC

## 2022-12-05 MED ORDER — LOSARTAN POTASSIUM 25 MG PO TABS: 25.0000 mg | ORAL_TABLET | Freq: Every day | ORAL | 3 refills | Status: AC

## 2022-12-05 MED ORDER — ROSUVASTATIN CALCIUM 5 MG PO TABS: 5.0000 mg | ORAL_TABLET | Freq: Every day | ORAL | 3 refills | Status: AC

## 2022-12-05 MED ORDER — OZEMPIC (0.25 OR 0.5 MG/DOSE) 2 MG/1.5ML ~~LOC~~ SOPN: 0.5000 mg | PEN_INJECTOR | SUBCUTANEOUS | 2 refills | Status: AC

## 2022-12-05 NOTE — Addendum Note (Signed)
Addended by: Amado Coe on: 12/05/2022 10:44 AM   Modules accepted: Orders

## 2022-12-05 NOTE — Assessment & Plan Note (Signed)

## 2022-12-05 NOTE — Assessment & Plan Note (Signed)

## 2022-12-05 NOTE — Assessment & Plan Note (Signed)

## 2022-12-05 NOTE — Addendum Note (Signed)
Addended by: Amado Coe on: 12/05/2022 10:51 AM   Modules accepted: Orders

## 2022-12-05 NOTE — Assessment & Plan Note (Signed)
Take Claritin 10 mg p.o. daily

## 2022-12-05 NOTE — Progress Notes (Signed)
Established Patient Office Visit  Subjective:  Patient ID: Kristin Wilson, female    DOB: Feb 12, 1961  Age: 62 y.o. MRN: [id number]  CC: No chief complaint on file.   HPI  Kristin Wilson presents for check up  Past Medical History:  Diagnosis Date   Bronchitis    Diabetes mellitus without complication ([place])    Hypertension    Hyperthyroidism    Osteoarthritis    Pyelonephritis    Sciatica    Urinary tract infection     Past Surgical History:  Procedure Laterality Date   ANTERIOR AND POSTERIOR REPAIR WITH SACROSPINOUS FIXATION N/A 09/24/2018   Procedure: POSTERIOR REPAIR;  Surgeon: Gae Dry, MD;  Location: [facility] ORS;  Service: Gynecology;  Laterality: N/A;   cholecystecomy     CHOLECYSTECTOMY     COLONOSCOPY WITH PROPOFOL N/A 12/19/2019   Procedure: COLONOSCOPY WITH PROPOFOL;  Surgeon: Lin Landsman, MD;  Location: [facility] ENDOSCOPY;  Service: Gastroenterology;  Laterality: N/A;   CYSTOSCOPY N/A 09/24/2018   Procedure: CYSTOSCOPY;  Surgeon: Gae Dry, MD;  Location: [facility] ORS;  Service: Gynecology;  Laterality: N/A;   LAPAROSCOPIC HYSTERECTOMY Bilateral 09/24/2018   Procedure: HYSTERECTOMY TOTAL LAPAROSCOPIC- BSO;  Surgeon: Gae Dry, MD;  Location: [facility] ORS;  Service: Gynecology;  Laterality: Bilateral;   TUBAL LIGATION      Family History  Problem Relation Age of Onset   Coronary artery disease Mother 35   Diabetic kidney disease Paternal Grandmother    CAD Paternal Grandmother 54    Social History   Socioeconomic History   Marital status: Divorced    Spouse name: Not on file   Number of children: Not on file   Years of education: Not on file   Highest education level: Some college, no degree  Occupational History   Occupation: home maker  Tobacco Use   Smoking status: Never   Smokeless tobacco: Never  Vaping Use   Vaping Use: Never used  Substance and Sexual Activity   Alcohol use: No   Drug use: No   Sexual activity: Not  Currently    Birth control/protection: None  Other Topics Concern   Not on file  Social History Narrative   Not on file   Social Determinants of Health   Financial Resource Strain: Medium Risk (11/08/2021)   Overall Financial Resource Strain (CARDIA)    Difficulty of Paying Living Expenses: Somewhat hard  Food Insecurity: No Food Insecurity (11/08/2021)   Hunger Vital Sign    Worried About Running Out of Food in the Last Year: Never true    Ran Out of Food in the Last Year: Never true  Transportation Needs: No Transportation Needs (11/08/2021)   PRAPARE - Hydrologist (Medical): No    Lack of Transportation (Non-Medical): No  Physical Activity: Inactive (11/08/2021)   Exercise Vital Sign    Days of Exercise per Week: 0 days    Minutes of Exercise per Session: 0 min  Stress: No Stress Concern Present (11/08/2021)   [place]    Feeling of Stress : Not at all  Social Connections: Moderately Integrated (11/08/2021)   Social Connection and Isolation Panel [NHANES]    Frequency of Communication with Friends and Family: More than three times a week    Frequency of Social Gatherings with Friends and Family: Twice a week    Attends Religious Services: More than 4 times per year  Active Member of Clubs or Organizations: No    Attends Archivist Meetings: More than 4 times per year    Marital Status: Divorced  Intimate Partner Violence: Not At Risk (11/08/2021)   Humiliation, Afraid, Rape, and Kick questionnaire    Fear of Current or Ex-Partner: No    Emotionally Abused: No    Physically Abused: No    Sexually Abused: No     Current Outpatient Medications:    Accu-Chek FastClix Lancets MISC, USE TO CHECK BLOOD SUGAR DAILY, Disp: 102 each, Rfl: 6   acetaminophen (TYLENOL) 500 MG tablet, Take by mouth every 6 (six) hours as needed., Disp: , Rfl:    amoxicillin-clavulanate  (AUGMENTIN) 875-125 MG tablet, Take 1 tablet by mouth 2 (two) times daily., Disp: 20 tablet, Rfl: 0   aspirin EC 81 MG tablet, Take 81 mg by mouth daily., Disp: , Rfl:    ciprofloxacin (CILOXAN) 0.3 % ophthalmic solution, Place 1 drop into both eyes every 2 (two) hours. Administer 1 drop, every 2 hours, while awake, for 2 days. Then 1 drop, every 4 hours, while awake, for the next 5 days., Disp: 5 mL, Rfl: 0   cyclobenzaprine (FLEXERIL) 10 MG tablet, Take 1 tablet (10 mg total) by mouth 3 (three) times daily as needed for muscle spasms., Disp: 30 tablet, Rfl: 0   glipiZIDE (GLUCOTROL XL) 10 MG 24 hr tablet, TAKE 1 TABLET BY MOUTH EVERY DAY, Disp: 90 tablet, Rfl: 1   glucose blood (ACCU-CHEK GUIDE) test strip, 1 each by Other route daily. Use as instructed, Disp: 100 strip, Rfl: 3   ibuprofen (ADVIL) 800 MG tablet, TAKE 1 TABLET BY MOUTH EVERY 8 HOURS AS NEEDED, Disp: 60 tablet, Rfl: 1   losartan (COZAAR) 25 MG tablet, TAKE 1 TABLET (25 MG TOTAL) BY MOUTH DAILY., Disp: 90 tablet, Rfl: 3   metFORMIN (GLUCOPHAGE) 500 MG tablet, Take 2 tablets (1,000 mg total) by mouth 2 (two) times daily., Disp: 360 tablet, Rfl: 2   methimazole (TAPAZOLE) 10 MG tablet, TAKE 1 TABLET BY MOUTH TWICE A DAY, Disp: 180 tablet, Rfl: 1   rosuvastatin (CRESTOR) 5 MG tablet, Take 1 tablet (5 mg total) by mouth daily., Disp: 90 tablet, Rfl: 3   Semaglutide,0.25 or 0.'5MG'$ /DOS, (OZEMPIC, 0.25 OR 0.5 MG/DOSE,) 2 MG/1.5ML SOPN, Inject 0.5 mg into the skin once a week., Disp: 3 mL, Rfl: 2   Allergies  Allergen Reactions   Tape Rash    ROS Review of Systems  Constitutional: Negative.   HENT: Negative.    Eyes: Negative.   Respiratory: Negative.    Cardiovascular: Negative.   Gastrointestinal: Negative.   Endocrine: Negative.   Genitourinary: Negative.   Musculoskeletal: Negative.   Skin: Negative.   Allergic/Immunologic: Negative.   Neurological: Negative.   Hematological: Negative.   Psychiatric/Behavioral: Negative.     All other systems reviewed and are negative.     Objective:    Physical Exam Vitals reviewed.  Constitutional:      Appearance: Normal appearance.  HENT:     Mouth/Throat:     Mouth: Mucous membranes are moist.  Eyes:     Pupils: Pupils are equal, round, and reactive to light.  Neck:     Vascular: No carotid bruit.  Cardiovascular:     Rate and Rhythm: Normal rate and regular rhythm.     Pulses: Normal pulses.     Heart sounds: Normal heart sounds.  Pulmonary:     Effort: Pulmonary effort is normal.  Breath sounds: Normal breath sounds.  Abdominal:     General: Bowel sounds are normal.     Palpations: Abdomen is soft. There is no hepatomegaly, splenomegaly or mass.     Tenderness: There is no abdominal tenderness.     Hernia: No hernia is present.  Musculoskeletal:        General: No tenderness.     Cervical back: Neck supple.     Right lower leg: No edema.     Left lower leg: No edema.  Skin:    Findings: No rash.  Neurological:     Mental Status: She is alert and oriented to person, place, and time.     Motor: No weakness.  Psychiatric:        Mood and Affect: Mood and affect normal.        Behavior: Behavior normal.     BP 128/78   Pulse 79   Ht '5\' 2"'$  (1.575 m)   Wt 182 lb (82.6 kg)   LMP 09/22/2017   SpO2 98%   BMI 33.29 kg/m  Wt Readings from Last 3 Encounters:  12/05/22 182 lb (82.6 kg)  05/26/22 185 lb 10.1 oz (84.2 kg)  07/27/21 193 lb 9.6 oz (87.8 kg)     Health Maintenance Due  Topic Date Due   COVID-19 Vaccine (1) Never done   FOOT EXAM  Never done   Diabetic kidney evaluation - Urine ACR  Never done   Hepatitis C Screening  Never done   DTaP/Tdap/Td (6 - Tdap) 08/17/1987   MAMMOGRAM  Never done   Zoster Vaccines- Shingrix (1 of 2) Never done   Medicare Annual Wellness (AWV)  11/08/2022   HEMOGLOBIN A1C  11/25/2022    There are no preventive care reminders to display for this patient.  Lab Results  Component Value Date   TSH  4.44 03/29/2022   Lab Results  Component Value Date   WBC 6.2 03/29/2022   HGB 14.9 03/29/2022   HCT 44.0 03/29/2022   MCV 85.9 03/29/2022   PLT 281 03/29/2022   Lab Results  Component Value Date   NA 142 03/29/2022   K 4.5 03/29/2022   CO2 23 03/29/2022   GLUCOSE 157 (H) 03/29/2022   BUN 19 03/29/2022   CREATININE 0.60 03/29/2022   BILITOT 1.1 03/29/2022   ALKPHOS 107 10/31/2018   AST 22 03/29/2022   ALT 22 03/29/2022   PROT 7.1 03/29/2022   ALBUMIN 4.2 10/31/2018   CALCIUM 9.5 03/29/2022   ANIONGAP 10 05/16/2019   EGFR 104 03/29/2022   Lab Results  Component Value Date   CHOL 216 (H) 03/29/2022   Lab Results  Component Value Date   HDL 41 (L) 03/29/2022   Lab Results  Component Value Date   LDLCALC 133 (H) 03/29/2022   Lab Results  Component Value Date   TRIG 275 (H) 03/29/2022   Lab Results  Component Value Date   CHOLHDL 5.3 (H) 03/29/2022   Lab Results  Component Value Date   HGBA1C 5.7 05/26/2022      Assessment & Plan:   Problem List Items Addressed This Visit       Cardiovascular and Mediastinum   Essential hypertension - Primary     Patient denies any chest pain or shortness of breath there is no history of palpitation or paroxysmal nocturnal dyspnea   patient was advised to follow low-salt low-cholesterol diet    ideally I want to keep systolic blood pressure below 130 mmHg, patient  was asked to check blood pressure one times a week and give me a report on that.  Patient will be follow-up in 3 months  or earlier as needed, patient will call me back for any change in the cardiovascular symptoms Patient was advised to buy a book from local bookstore concerning blood pressure and read several chapters  every day.  This will be supplemented by some of the material we will give him from the office.  Patient should also utilize other resources like YouTube and Internet to learn more about the blood pressure and the diet.        Respiratory    Sinusitis    Take Claritin 10 mg p.o. daily          Endocrine   Type 2 diabetes mellitus with hyperglycemia, without long-term current use of insulin ([place])    - The patient's blood sugar is labile on med. - The patient will continue the current treatment regimen.  - I encouraged the patient to regularly check blood sugar.  - I encouraged the patient to monitor diet. I encouraged the patient to eat low-carb and low-sugar to help prevent blood sugar spikes.  - I encouraged the patient to continue following their prescribed treatment plan for diabetes - I informed the patient to get help if blood sugar drops below '54mg'$ /dL, or if suddenly have trouble thinking clearly or breathing.  Patient was advised to buy a book on diabetes from a local bookstore or from [place]  Patient should read 2 chapters every day to keep the motivation going, this is in addition to some of the materials we provided them from the office.  There are other resources on the Internet like YouTube and wilkipedia to get an education on the diabetes        Musculoskeletal and Integument   Lumbar degenerative disc disease    Chronic problem- Patient's back pain is under control with medication.  - Encouraged the patient to stretch or do yoga as able to help with back pain        Other   Obesity (BMI 30.0-34.9)    - I encouraged the patient to lose weight.  - I educated them on making healthy dietary choices including eating more fruits and vegetables and less fried foods. - I encouraged the patient to exercise more, and educated on the benefits of exercise including weight loss, diabetes prevention, and hypertension prevention.   Dietary counseling with a registered dietician  Referral to a weight management support group (e.g. Weight Watchers, Overeaters Anonymous)  If your BMI is greater than 29 or you have gained more than 15 pounds you should work on weight loss.  Attend a healthy cooking class       No  orders of the defined types were placed in this encounter.   Follow-up: No follow-ups on file.    Cletis Athens, MD

## 2022-12-05 NOTE — Assessment & Plan Note (Signed)
Chronic problem- Patient's back pain is under control with medication.  - Encouraged the patient to stretch or do yoga as able to help with back pain

## 2022-12-06 ENCOUNTER — Other Ambulatory Visit: Payer: Self-pay | Admitting: Internal Medicine

## 2022-12-06 DIAGNOSIS — E1165 Type 2 diabetes mellitus with hyperglycemia: Secondary | ICD-10-CM

## 2022-12-06 LAB — CBC WITH DIFFERENTIAL/PLATELET
Absolute Monocytes: 627 cells/uL (ref 200–950)
Basophils Absolute: 102 cells/uL (ref 0–200)
Basophils Relative: 1.6 %
Eosinophils Absolute: 147 cells/uL (ref 15–500)
Eosinophils Relative: 2.3 %
HCT: 44.7 % (ref 35.0–45.0)
Hemoglobin: 15.1 g/dL (ref 11.7–15.5)
Lymphs Abs: 2355 cells/uL (ref 850–3900)
MCH: 29.2 pg (ref 27.0–33.0)
MCHC: 33.8 g/dL (ref 32.0–36.0)
MCV: 86.5 fL (ref 80.0–100.0)
MPV: 11.2 fL (ref 7.5–12.5)
Monocytes Relative: 9.8 %
Neutro Abs: 3168 cells/uL (ref 1500–7800)
Neutrophils Relative %: 49.5 %
Platelets: 249 10*3/uL (ref 140–400)
RBC: 5.17 10*6/uL — ABNORMAL HIGH (ref 3.80–5.10)
RDW: 13.3 % (ref 11.0–15.0)
Total Lymphocyte: 36.8 %
WBC: 6.4 10*3/uL (ref 3.8–10.8)

## 2022-12-06 LAB — LIPID PANEL
Cholesterol: 247 mg/dL — ABNORMAL HIGH (ref <200)
HDL: 45 mg/dL — ABNORMAL LOW (ref >=50)
Non-HDL Cholesterol (Calc): 202 mg/dL (calc) — ABNORMAL HIGH (ref <130)
Total CHOL/HDL Ratio: 5.5 (calc) — ABNORMAL HIGH (ref <5.0)
Triglycerides: 488 mg/dL — ABNORMAL HIGH (ref <150)

## 2022-12-06 LAB — BASIC METABOLIC PANEL
BUN: 14 mg/dL (ref 7–25)
CO2: 27 mmol/L (ref 20–32)
Calcium: 9.7 mg/dL (ref 8.6–10.4)
Chloride: 96 mmol/L — ABNORMAL LOW (ref 98–110)
Creat: 0.6 mg/dL (ref 0.50–1.03)
Glucose, Bld: 328 mg/dL — ABNORMAL HIGH (ref 65–99)
Potassium: 4.5 mmol/L (ref 3.5–5.3)
Sodium: 136 mmol/L (ref 135–146)

## 2022-12-06 LAB — HEMOGLOBIN A1C
Hgb A1c MFr Bld: 9.1 % of total Hgb — ABNORMAL HIGH (ref <5.7)
Mean Plasma Glucose: 214 mg/dL
eAG (mmol/L): 11.9 mmol/L

## 2022-12-15 DIAGNOSIS — N390 Urinary tract infection, site not specified: Secondary | ICD-10-CM | POA: Diagnosis not present

## 2022-12-15 DIAGNOSIS — R350 Frequency of micturition: Secondary | ICD-10-CM | POA: Diagnosis not present

## 2022-12-15 DIAGNOSIS — R300 Dysuria: Secondary | ICD-10-CM | POA: Diagnosis not present

## 2023-01-31 ENCOUNTER — Ambulatory Visit
Admission: RE | Admit: 2023-01-31 | Discharge: 2023-01-31 | Disposition: A | Discharge status: Home / Self Care | Payer: 59 | Source: Ambulatory Visit | Attending: Internal Medicine | Admitting: Internal Medicine

## 2023-01-31 DIAGNOSIS — Z1231 Encounter for screening mammogram for malignant neoplasm of breast: Secondary | ICD-10-CM | POA: Diagnosis not present

## 2023-02-09 DIAGNOSIS — H0520 Unspecified exophthalmos: Secondary | ICD-10-CM | POA: Diagnosis not present

## 2023-02-09 DIAGNOSIS — E119 Type 2 diabetes mellitus without complications: Secondary | ICD-10-CM | POA: Diagnosis not present

## 2023-02-09 DIAGNOSIS — E0590 Thyrotoxicosis, unspecified without thyrotoxic crisis or storm: Secondary | ICD-10-CM | POA: Diagnosis not present

## 2023-03-15 DIAGNOSIS — E119 Type 2 diabetes mellitus without complications: Secondary | ICD-10-CM | POA: Diagnosis not present

## 2023-04-12 ENCOUNTER — Encounter: Payer: Self-pay | Admitting: Podiatry

## 2023-04-12 ENCOUNTER — Ambulatory Visit (INDEPENDENT_AMBULATORY_CARE_PROVIDER_SITE_OTHER): Payer: 59 | Admitting: Podiatry

## 2023-04-12 DIAGNOSIS — M2042 Other hammer toe(s) (acquired), left foot: Secondary | ICD-10-CM | POA: Diagnosis not present

## 2023-04-12 DIAGNOSIS — M2041 Other hammer toe(s) (acquired), right foot: Secondary | ICD-10-CM

## 2023-04-12 DIAGNOSIS — E119 Type 2 diabetes mellitus without complications: Secondary | ICD-10-CM | POA: Diagnosis not present

## 2023-04-12 DIAGNOSIS — E1165 Type 2 diabetes mellitus with hyperglycemia: Secondary | ICD-10-CM

## 2023-04-12 NOTE — Progress Notes (Signed)
Subjective: Kristin Wilson presents today referred by Corky Downs, MD for diabetic foot evaluation.  Patient relates many year history of diabetes.  Patient denies any history of foot wounds.  Patient denies any history of numbness, tingling, burning, pins/needles sensations.  Past Medical History:  Diagnosis Date   Bronchitis    Diabetes mellitus without complication (HCC)    Hypertension    Hyperthyroidism    Osteoarthritis    Pyelonephritis    Sciatica    Urinary tract infection     Patient Active Problem List   Diagnosis Date Noted   Sinusitis 05/26/2022   Chronic bilateral low back pain without sciatica 03/31/2021   Right ear pain 02/03/2021   Obesity (BMI 30.0-34.9) 12/31/2020   Type 2 diabetes mellitus with hyperglycemia, without long-term current use of insulin (HCC) 12/23/2020   Hospital discharge follow-up 10/15/2020   Need for influenza vaccination 10/15/2020   Encounter for screening colonoscopy    Lumbar facet arthropathy 07/17/2019   Lumbar spondylosis 07/17/2019   Lumbar degenerative disc disease 07/17/2019   Chronic pain syndrome 07/17/2019   Chest pain, moderate coronary artery risk 04/30/2019   Essential hypertension 04/30/2019   Fibroid 09/09/2018   Pelvic pain 09/09/2018   Rectocele 09/09/2018   Sepsis (HCC) 09/08/2017   Chest discomfort 10/08/2015   DOE (dyspnea on exertion) 10/08/2015   Hyperglycemia 10/08/2015    Past Surgical History:  Procedure Laterality Date   ANTERIOR AND POSTERIOR REPAIR WITH SACROSPINOUS FIXATION N/A 09/24/2018   Procedure: POSTERIOR REPAIR;  Surgeon: Nadara Mustard, MD;  Location: [facility] ORS;  Service: Gynecology;  Laterality: N/A;   cholecystecomy     CHOLECYSTECTOMY     COLONOSCOPY WITH PROPOFOL N/A 12/19/2019   Procedure: COLONOSCOPY WITH PROPOFOL;  Surgeon: Toney Reil, MD;  Location: [facility] ENDOSCOPY;  Service: Gastroenterology;  Laterality: N/A;   CYSTOSCOPY N/A 09/24/2018   Procedure: CYSTOSCOPY;   Surgeon: Nadara Mustard, MD;  Location: [facility] ORS;  Service: Gynecology;  Laterality: N/A;   LAPAROSCOPIC HYSTERECTOMY Bilateral 09/24/2018   Procedure: HYSTERECTOMY TOTAL LAPAROSCOPIC- BSO;  Surgeon: Nadara Mustard, MD;  Location: [facility] ORS;  Service: Gynecology;  Laterality: Bilateral;   TUBAL LIGATION      Current Outpatient Medications on File Prior to Visit  Medication Sig Dispense Refill   Accu-Chek FastClix Lancets MISC USE TO CHECK BLOOD SUGAR DAILY 102 each 6   acetaminophen (TYLENOL) 500 MG tablet Take by mouth every 6 (six) hours as needed.     amoxicillin-clavulanate (AUGMENTIN) 875-125 MG tablet Take 1 tablet by mouth 2 (two) times daily. 20 tablet 0   aspirin EC 81 MG tablet Take 81 mg by mouth daily.     ciprofloxacin (CILOXAN) 0.3 % ophthalmic solution Place 1 drop into both eyes every 2 (two) hours. Administer 1 drop, every 2 hours, while awake, for 2 days. Then 1 drop, every 4 hours, while awake, for the next 5 days. 5 mL 0   cyclobenzaprine (FLEXERIL) 10 MG tablet Take 1 tablet (10 mg total) by mouth 3 (three) times daily as needed for muscle spasms. 30 tablet 0   glipiZIDE (GLUCOTROL XL) 10 MG 24 hr tablet Take 1 tablet (10 mg total) by mouth daily. 90 tablet 1   glucose blood (ACCU-CHEK GUIDE) test strip 1 each by Other route daily. Use as instructed 100 strip 3   ibuprofen (ADVIL) 800 MG tablet TAKE 1 TABLET BY MOUTH EVERY 8 HOURS AS NEEDED 60 tablet 1   losartan (COZAAR) 25 MG tablet Take  1 tablet (25 mg total) by mouth daily. 90 tablet 3   metFORMIN (GLUCOPHAGE) 500 MG tablet Take 2 tablets (1,000 mg total) by mouth 2 (two) times daily. 360 tablet 2   methimazole (TAPAZOLE) 10 MG tablet TAKE 1 TABLET BY MOUTH TWICE A DAY 180 tablet 1   rosuvastatin (CRESTOR) 5 MG tablet Take 1 tablet (5 mg total) by mouth daily. 90 tablet 3   Semaglutide,0.25 or 0.5MG /DOS, (OZEMPIC, 0.25 OR 0.5 MG/DOSE,) 2 MG/1.5ML SOPN Inject 0.5 mg into the skin once a week. 3 mL 2   No current  facility-administered medications on file prior to visit.     Allergies  Allergen Reactions   Tape Rash    Social History   Occupational History   Occupation: home maker  Tobacco Use   Smoking status: Never   Smokeless tobacco: Never  Vaping Use   Vaping Use: Never used  Substance and Sexual Activity   Alcohol use: No   Drug use: No   Sexual activity: Not Currently    Birth control/protection: None    Family History  Problem Relation Age of Onset   Coronary artery disease Mother 23   Diabetic kidney disease Paternal Grandmother    CAD Paternal Grandmother 51    Immunization History  Administered Date(s) Administered   Dtap, Unspecified 09/25/1963, 10/30/1963, 12/04/1963, 12/09/1964   Influenza,inj,Quad PF,6+ Mos 10/08/2015, 09/24/2017, 09/09/2018, 08/27/2019, 10/15/2020, 09/12/2021, 08/17/2022   Influenza-Unspecified 09/10/2018   Pneumococcal Polysaccharide-23 11/05/2018   Polio, Unspecified 09/25/1963, 10/30/1963, 12/09/1964, 03/10/1965, 07/27/1969, 09/30/1970   Rubella 09/30/1970   Td (Adult),unspecified 09/30/1973, 08/16/1987    Review of systems: Positive Findings in bold print.  Constitutional:  chills, fatigue, fever, sweats, weight change Communication: Nurse, learning disability, sign Presenter, broadcasting, hand writing, iPad/Android device Head: headaches, head injury Eyes: changes in vision, eye pain, glaucoma, cataracts, macular degeneration, diplopia, glare,  light sensitivity, eyeglasses or contacts, blindness Ears nose mouth throat: hearing impaired, hearing aids,  ringing in ears, deaf, sign language,  vertigo, nosebleeds,  rhinitis,  cold sores, snoring, swollen glands Cardiovascular: HTN, edema, arrhythmia, pacemaker in place, defibrillator in place, chest pain/tightness, chronic anticoagulation, blood clot, heart failure, MI Peripheral Vascular: leg cramps, varicose veins, blood clots, lymphedema, varicosities Respiratory:  asthma, difficulty breathing, denies  congestion, SOB, wheezing, cough, emphysema Gastrointestinal: change in appetite or weight, abdominal pain, constipation, diarrhea, nausea, vomiting, vomiting blood, change in bowel habits, abdominal pain, jaundice, rectal bleeding, hemorrhoids, GERD Genitourinary:  nocturia,  pain on urination, polyuria,  blood in urine, Foley catheter, urinary urgency, ESRD on hemodialysis Musculoskeletal: amputation, cramping, stiff joints, painful joints, decreased joint motion, fractures, OA, gout, hemiplegia, paraplegia, uses cane, wheelchair bound, uses walker, uses rollator Skin: +changes in toenails, color change, dryness, itching, mole changes,  rash, wound(s) Neurological: headaches, numbness in feet, paresthesias in feet, burning in feet, fainting,  seizures, change in speech, migraines, memory problems/poor historian, cerebral palsy, weakness, paralysis, CVA, TIA Endocrine: diabetes, hypothyroidism, hyperthyroidism,  goiter, dry mouth, flushing, heat intolerance, cold intolerance,  excessive thirst, denies polyuria,  nocturia Hematological:  easy bleeding, excessive bleeding, easy bruising, enlarged lymph nodes, on long term blood thinner, history of past transusions Allergy/immunological:  hives, eczema, frequent infections, multiple drug allergies, seasonal allergies, transplant recipient, multiple food allergies Psychiatric:  anxiety, depression, mood disorder, suicidal ideations, hallucinations, insomnia  Objective: There were no vitals filed for this visit. Vascular Examination: Capillary refill time less than 3 seconds x 10 digits.  Dorsalis pedis pulses palpable 2 out of 4.  Posterior tibial pulses palpable  2 out of 4.  Digital hair not present x 10 digits.  Skin temperature gradient WNL b/l.  Dermatological Examination: Skin with normal turgor, texture and tone b/l  Toenails 1-5 b/l discolored, thick, dystrophic with subungual debris and pain with palpation to nailbeds due to thickness  of nails.  Musculoskeletal: Muscle strength 5/5 to all LE muscle groups.  Hammertoe contractures noted digits 2 through 5 bilaterally semiflexible  Neurological: Sensation intact with 10 gram monofilament.  Vibratory sensation intact.  Assessment: NIDDM Encounter for diabetic foot examination MRI bilateral 2 through 5  Plan: Discussed diabetic foot care principles. Literature dispensed on today. Patient to continue soft, supportive shoe gear daily. Patient to report any pedal injuries to medical professional immediately. Follow up one year. Patient/POA to call should there be a concern in the interim. Given the presence of diabetes in the setting of hammertoe contracture patient will benefit from diabetic shoes.  I discussed this with patient she states understand like to proceed with diabetic shoes

## 2023-04-17 DIAGNOSIS — M533 Sacrococcygeal disorders, not elsewhere classified: Secondary | ICD-10-CM | POA: Diagnosis not present

## 2023-04-17 DIAGNOSIS — E119 Type 2 diabetes mellitus without complications: Secondary | ICD-10-CM | POA: Diagnosis not present

## 2023-04-17 DIAGNOSIS — M47816 Spondylosis without myelopathy or radiculopathy, lumbar region: Secondary | ICD-10-CM | POA: Diagnosis not present

## 2023-05-04 ENCOUNTER — Ambulatory Visit (INDEPENDENT_AMBULATORY_CARE_PROVIDER_SITE_OTHER): Payer: 59 | Admitting: Podiatry

## 2023-05-04 DIAGNOSIS — E1165 Type 2 diabetes mellitus with hyperglycemia: Secondary | ICD-10-CM

## 2023-05-04 DIAGNOSIS — M2042 Other hammer toe(s) (acquired), left foot: Secondary | ICD-10-CM

## 2023-05-04 DIAGNOSIS — M2041 Other hammer toe(s) (acquired), right foot: Secondary | ICD-10-CM

## 2023-05-04 NOTE — Progress Notes (Signed)
Patient presents today to be mesured for diabetic shoes and insoles.  Patient was measured for  1 pair of diabetic shoes and 3 pairs of foam casted diabetic insoles.   Ht 5'2 Wt 188 Shoe size  9w Shoe type 1. A400w                     2 a200w                      3. a700w  Treating physician:  Corky Downs  Re-appointment for regularly scheduled diabetic foot care visits or if they should experience any trouble with the shoes or insoles.

## 2023-05-11 DIAGNOSIS — E119 Type 2 diabetes mellitus without complications: Secondary | ICD-10-CM | POA: Diagnosis not present

## 2023-05-11 DIAGNOSIS — H0520 Unspecified exophthalmos: Secondary | ICD-10-CM | POA: Diagnosis not present

## 2023-05-11 DIAGNOSIS — E0590 Thyrotoxicosis, unspecified without thyrotoxic crisis or storm: Secondary | ICD-10-CM | POA: Diagnosis not present

## 2023-05-11 DIAGNOSIS — M533 Sacrococcygeal disorders, not elsewhere classified: Secondary | ICD-10-CM | POA: Diagnosis not present

## 2023-05-11 DIAGNOSIS — M549 Dorsalgia, unspecified: Secondary | ICD-10-CM | POA: Diagnosis not present

## 2023-05-11 DIAGNOSIS — E785 Hyperlipidemia, unspecified: Secondary | ICD-10-CM | POA: Diagnosis not present

## 2023-05-24 DIAGNOSIS — M5416 Radiculopathy, lumbar region: Secondary | ICD-10-CM | POA: Diagnosis not present

## 2023-05-24 DIAGNOSIS — M533 Sacrococcygeal disorders, not elsewhere classified: Secondary | ICD-10-CM | POA: Diagnosis not present

## 2023-05-29 DIAGNOSIS — E0590 Thyrotoxicosis, unspecified without thyrotoxic crisis or storm: Secondary | ICD-10-CM | POA: Diagnosis not present

## 2023-05-29 DIAGNOSIS — I10 Essential (primary) hypertension: Secondary | ICD-10-CM | POA: Diagnosis not present

## 2023-05-29 DIAGNOSIS — M5116 Intervertebral disc disorders with radiculopathy, lumbar region: Secondary | ICD-10-CM | POA: Diagnosis not present

## 2023-05-29 DIAGNOSIS — E1165 Type 2 diabetes mellitus with hyperglycemia: Secondary | ICD-10-CM | POA: Diagnosis not present

## 2023-05-30 DIAGNOSIS — M5416 Radiculopathy, lumbar region: Secondary | ICD-10-CM | POA: Diagnosis not present

## 2023-06-06 DIAGNOSIS — N390 Urinary tract infection, site not specified: Secondary | ICD-10-CM | POA: Diagnosis not present

## 2023-06-06 DIAGNOSIS — R300 Dysuria: Secondary | ICD-10-CM | POA: Diagnosis not present

## 2023-06-11 DIAGNOSIS — M1711 Unilateral primary osteoarthritis, right knee: Secondary | ICD-10-CM | POA: Diagnosis not present

## 2023-06-21 DIAGNOSIS — E041 Nontoxic single thyroid nodule: Secondary | ICD-10-CM | POA: Diagnosis not present

## 2023-06-21 DIAGNOSIS — Z8639 Personal history of other endocrine, nutritional and metabolic disease: Secondary | ICD-10-CM | POA: Diagnosis not present

## 2023-07-09 DIAGNOSIS — E1165 Type 2 diabetes mellitus with hyperglycemia: Secondary | ICD-10-CM | POA: Diagnosis not present

## 2023-07-09 DIAGNOSIS — E1169 Type 2 diabetes mellitus with other specified complication: Secondary | ICD-10-CM | POA: Diagnosis not present

## 2023-07-09 DIAGNOSIS — E042 Nontoxic multinodular goiter: Secondary | ICD-10-CM | POA: Diagnosis not present

## 2023-07-09 DIAGNOSIS — E0590 Thyrotoxicosis, unspecified without thyrotoxic crisis or storm: Secondary | ICD-10-CM | POA: Diagnosis not present

## 2023-09-06 DIAGNOSIS — E1165 Type 2 diabetes mellitus with hyperglycemia: Secondary | ICD-10-CM | POA: Diagnosis not present

## 2023-09-06 DIAGNOSIS — E0590 Thyrotoxicosis, unspecified without thyrotoxic crisis or storm: Secondary | ICD-10-CM | POA: Diagnosis not present

## 2023-09-12 DIAGNOSIS — E1165 Type 2 diabetes mellitus with hyperglycemia: Secondary | ICD-10-CM | POA: Diagnosis not present

## 2023-09-12 DIAGNOSIS — Z23 Encounter for immunization: Secondary | ICD-10-CM | POA: Diagnosis not present

## 2023-09-12 DIAGNOSIS — E1169 Type 2 diabetes mellitus with other specified complication: Secondary | ICD-10-CM | POA: Diagnosis not present

## 2023-09-12 DIAGNOSIS — E042 Nontoxic multinodular goiter: Secondary | ICD-10-CM | POA: Diagnosis not present

## 2023-09-12 DIAGNOSIS — E0590 Thyrotoxicosis, unspecified without thyrotoxic crisis or storm: Secondary | ICD-10-CM | POA: Diagnosis not present

## 2023-09-26 DIAGNOSIS — N390 Urinary tract infection, site not specified: Secondary | ICD-10-CM | POA: Diagnosis not present

## 2023-09-26 DIAGNOSIS — R350 Frequency of micturition: Secondary | ICD-10-CM | POA: Diagnosis not present

## 2023-09-26 DIAGNOSIS — M1711 Unilateral primary osteoarthritis, right knee: Secondary | ICD-10-CM | POA: Diagnosis not present

## 2023-11-02 ENCOUNTER — Other Ambulatory Visit: Payer: Self-pay | Admitting: Internal Medicine

## 2023-12-26 DIAGNOSIS — E1165 Type 2 diabetes mellitus with hyperglycemia: Secondary | ICD-10-CM | POA: Diagnosis not present

## 2023-12-26 DIAGNOSIS — E0590 Thyrotoxicosis, unspecified without thyrotoxic crisis or storm: Secondary | ICD-10-CM | POA: Diagnosis not present

## 2023-12-31 DIAGNOSIS — E1165 Type 2 diabetes mellitus with hyperglycemia: Secondary | ICD-10-CM | POA: Diagnosis not present

## 2023-12-31 DIAGNOSIS — E0590 Thyrotoxicosis, unspecified without thyrotoxic crisis or storm: Secondary | ICD-10-CM | POA: Diagnosis not present

## 2023-12-31 DIAGNOSIS — E1169 Type 2 diabetes mellitus with other specified complication: Secondary | ICD-10-CM | POA: Diagnosis not present

## 2023-12-31 DIAGNOSIS — I10 Essential (primary) hypertension: Secondary | ICD-10-CM | POA: Diagnosis not present

## 2023-12-31 DIAGNOSIS — K219 Gastro-esophageal reflux disease without esophagitis: Secondary | ICD-10-CM | POA: Diagnosis not present

## 2023-12-31 DIAGNOSIS — E042 Nontoxic multinodular goiter: Secondary | ICD-10-CM | POA: Diagnosis not present
